# Patient Record
Sex: Female | Born: 1965 | Race: White | Hispanic: No
Health system: Southern US, Academic
[De-identification: ages and names within clinical notes are randomized; demographics above are authoritative.]

## PROBLEM LIST (undated history)

## (undated) ENCOUNTER — Ambulatory Visit: Payer: MEDICARE

## (undated) ENCOUNTER — Telehealth

## (undated) ENCOUNTER — Encounter
Attending: Student in an Organized Health Care Education/Training Program | Primary: Student in an Organized Health Care Education/Training Program

## (undated) ENCOUNTER — Encounter

## (undated) ENCOUNTER — Encounter
Attending: Pharmacist Clinician (PhC)/ Clinical Pharmacy Specialist | Primary: Pharmacist Clinician (PhC)/ Clinical Pharmacy Specialist

## (undated) ENCOUNTER — Encounter: Attending: Critical Care Medicine | Primary: Critical Care Medicine

## (undated) ENCOUNTER — Telehealth
Attending: Student in an Organized Health Care Education/Training Program | Primary: Student in an Organized Health Care Education/Training Program

## (undated) ENCOUNTER — Telehealth
Attending: Pharmacist Clinician (PhC)/ Clinical Pharmacy Specialist | Primary: Pharmacist Clinician (PhC)/ Clinical Pharmacy Specialist

## (undated) ENCOUNTER — Encounter
Payer: MEDICARE | Attending: Student in an Organized Health Care Education/Training Program | Primary: Student in an Organized Health Care Education/Training Program

## (undated) ENCOUNTER — Non-Acute Institutional Stay: Payer: MEDICARE

## (undated) DIAGNOSIS — Z1509 Genetic susceptibility to other malignant neoplasm: Secondary | ICD-10-CM

## (undated) DIAGNOSIS — Z9221 Personal history of antineoplastic chemotherapy: Secondary | ICD-10-CM

## (undated) DIAGNOSIS — F419 Anxiety disorder, unspecified: Secondary | ICD-10-CM

## (undated) DIAGNOSIS — Z9289 Personal history of other medical treatment: Secondary | ICD-10-CM

## (undated) DIAGNOSIS — F319 Bipolar disorder, unspecified: Secondary | ICD-10-CM

## (undated) DIAGNOSIS — F32A Depression, unspecified: Secondary | ICD-10-CM

## (undated) DIAGNOSIS — IMO0002 Reserved for concepts with insufficient information to code with codable children: Secondary | ICD-10-CM

## (undated) DIAGNOSIS — S42302A Unspecified fracture of shaft of humerus, left arm, initial encounter for closed fracture: Secondary | ICD-10-CM

## (undated) DIAGNOSIS — I10 Essential (primary) hypertension: Secondary | ICD-10-CM

## (undated) DIAGNOSIS — E785 Hyperlipidemia, unspecified: Secondary | ICD-10-CM

## (undated) DIAGNOSIS — F329 Major depressive disorder, single episode, unspecified: Secondary | ICD-10-CM

## (undated) DIAGNOSIS — C50919 Malignant neoplasm of unspecified site of unspecified female breast: Secondary | ICD-10-CM

## (undated) DIAGNOSIS — T4145XA Adverse effect of unspecified anesthetic, initial encounter: Secondary | ICD-10-CM

## (undated) DIAGNOSIS — C719 Malignant neoplasm of brain, unspecified: Secondary | ICD-10-CM

## (undated) DIAGNOSIS — Z9882 Breast implant status: Secondary | ICD-10-CM

## (undated) DIAGNOSIS — M461 Sacroiliitis, not elsewhere classified: Secondary | ICD-10-CM

## (undated) DIAGNOSIS — J309 Allergic rhinitis, unspecified: Secondary | ICD-10-CM

## (undated) DIAGNOSIS — C55 Malignant neoplasm of uterus, part unspecified: Secondary | ICD-10-CM

## (undated) DIAGNOSIS — Z800 Family history of malignant neoplasm of digestive organs: Secondary | ICD-10-CM

## (undated) DIAGNOSIS — G589 Mononeuropathy, unspecified: Secondary | ICD-10-CM

## (undated) DIAGNOSIS — C801 Malignant (primary) neoplasm, unspecified: Secondary | ICD-10-CM

## (undated) DIAGNOSIS — T8859XA Other complications of anesthesia, initial encounter: Secondary | ICD-10-CM

## (undated) DIAGNOSIS — IMO0001 Reserved for inherently not codable concepts without codable children: Secondary | ICD-10-CM

## (undated) DIAGNOSIS — Z808 Family history of malignant neoplasm of other organs or systems: Secondary | ICD-10-CM

## (undated) DIAGNOSIS — F339 Major depressive disorder, recurrent, unspecified: Secondary | ICD-10-CM

## (undated) DIAGNOSIS — Z1507 Genetic susceptibility to malignant neoplasm of urinary tract: Secondary | ICD-10-CM

## (undated) HISTORY — DX: Breast implant status: Z98.82

## (undated) HISTORY — DX: Family history of malignant neoplasm of other organs or systems: Z80.8

## (undated) HISTORY — PX: OTHER SURGICAL HISTORY: SHX169

## (undated) HISTORY — DX: Family history of malignant neoplasm of digestive organs: Z80.0

## (undated) HISTORY — DX: Hyperlipidemia, unspecified: E78.5

## (undated) HISTORY — DX: Malignant neoplasm of uterus, part unspecified: C55

## (undated) HISTORY — DX: Genetic susceptibility to malignant neoplasm of urinary tract: Z15.07

## (undated) HISTORY — PX: KNEE SURGERY: SHX244

## (undated) HISTORY — DX: Essential (primary) hypertension: I10

## (undated) HISTORY — DX: Mononeuropathy, unspecified: G58.9

## (undated) HISTORY — DX: Malignant neoplasm of brain, unspecified: C71.9

## (undated) HISTORY — DX: Sacroiliitis, not elsewhere classified: M46.1

## (undated) HISTORY — DX: Genetic susceptibility to other malignant neoplasm: Z15.09

## (undated) HISTORY — DX: Bipolar disorder, unspecified: F31.9

## (undated) HISTORY — PX: TUBAL LIGATION: SHX77

## (undated) HISTORY — DX: Malignant neoplasm of unspecified site of unspecified female breast: C50.919

## (undated) HISTORY — PX: MULTIPLE TOOTH EXTRACTIONS: SHX2053

## (undated) HISTORY — DX: Major depressive disorder, recurrent, unspecified: F33.9

## (undated) HISTORY — PX: MASTECTOMY: SHX3

## (undated) HISTORY — DX: Allergic rhinitis, unspecified: J30.9

## (undated) SURGERY — RECONSTRUCTION, BREAST
Anesthesia: General | Laterality: Bilateral

---
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
## 2010-10-22 ENCOUNTER — Ambulatory Visit: Payer: Self-pay | Admitting: Genetic Counselor

## 2010-11-03 DIAGNOSIS — C801 Malignant (primary) neoplasm, unspecified: Secondary | ICD-10-CM

## 2010-11-03 HISTORY — DX: Malignant (primary) neoplasm, unspecified: C80.1

## 2010-11-03 HISTORY — PX: BREAST SURGERY: SHX581

## 2010-12-11 ENCOUNTER — Other Ambulatory Visit (HOSPITAL_COMMUNITY): Payer: Self-pay | Admitting: Surgery

## 2010-12-11 DIAGNOSIS — C50919 Malignant neoplasm of unspecified site of unspecified female breast: Secondary | ICD-10-CM

## 2010-12-12 ENCOUNTER — Other Ambulatory Visit (HOSPITAL_COMMUNITY): Payer: Self-pay | Admitting: Surgery

## 2010-12-12 ENCOUNTER — Encounter (HOSPITAL_COMMUNITY)
Admission: RE | Admit: 2010-12-12 | Discharge: 2010-12-12 | Disposition: A | Discharge status: Home / Self Care | Payer: Medicare Other | Source: Ambulatory Visit | Attending: Surgery | Admitting: Surgery

## 2010-12-12 DIAGNOSIS — C50919 Malignant neoplasm of unspecified site of unspecified female breast: Secondary | ICD-10-CM

## 2010-12-12 DIAGNOSIS — Z01818 Encounter for other preprocedural examination: Secondary | ICD-10-CM | POA: Insufficient documentation

## 2010-12-12 LAB — COMPREHENSIVE METABOLIC PANEL
BUN: 5 mg/dL — ABNORMAL LOW (ref 6–23)
CO2: 26 mEq/L (ref 19–32)
Chloride: 105 mEq/L (ref 96–112)
Creatinine, Ser: 0.79 mg/dL (ref 0.4–1.2)
GFR calc non Af Amer: >60 mL/min (ref >60)
Glucose, Bld: 105 mg/dL — ABNORMAL HIGH (ref 70–99)
Total Bilirubin: 0.2 mg/dL — ABNORMAL LOW (ref 0.3–1.2)

## 2010-12-12 LAB — SURGICAL PCR SCREEN
MRSA, PCR: NEGATIVE
Staphylococcus aureus: POSITIVE — AB

## 2010-12-12 LAB — DIFFERENTIAL
Basophils Absolute: 0.1 10*3/uL (ref 0.0–0.1)
Lymphocytes Relative: 33 % (ref 12–46)
Neutro Abs: 6.3 10*3/uL (ref 1.7–7.7)
Neutrophils Relative %: 58 % (ref 43–77)

## 2010-12-12 LAB — CBC
HCT: 30.5 % — ABNORMAL LOW (ref 36.0–46.0)
Hemoglobin: 9.7 g/dL — ABNORMAL LOW (ref 12.0–15.0)
WBC: 10.9 10*3/uL — ABNORMAL HIGH (ref 4.0–10.5)

## 2010-12-16 ENCOUNTER — Ambulatory Visit (HOSPITAL_COMMUNITY): Admission: RE | Admit: 2010-12-16 | Payer: Self-pay | Source: Home / Self Care | Admitting: Surgery

## 2010-12-25 ENCOUNTER — Inpatient Hospital Stay (HOSPITAL_COMMUNITY)
Admission: RE | Admit: 2010-12-25 | Discharge: 2010-12-27 | DRG: 581 | Disposition: A | Discharge status: Home / Self Care | Payer: Medicare Other | Source: Ambulatory Visit | Attending: Plastic Surgery | Admitting: Plastic Surgery

## 2010-12-25 ENCOUNTER — Ambulatory Visit (HOSPITAL_COMMUNITY): Payer: Medicare Other

## 2010-12-25 ENCOUNTER — Other Ambulatory Visit: Payer: Self-pay | Admitting: Surgery

## 2010-12-25 DIAGNOSIS — C50919 Malignant neoplasm of unspecified site of unspecified female breast: Secondary | ICD-10-CM

## 2010-12-25 DIAGNOSIS — F17200 Nicotine dependence, unspecified, uncomplicated: Secondary | ICD-10-CM | POA: Diagnosis present

## 2010-12-25 DIAGNOSIS — Z79899 Other long term (current) drug therapy: Secondary | ICD-10-CM

## 2010-12-25 DIAGNOSIS — D0590 Unspecified type of carcinoma in situ of unspecified breast: Principal | ICD-10-CM | POA: Diagnosis present

## 2010-12-25 DIAGNOSIS — F319 Bipolar disorder, unspecified: Secondary | ICD-10-CM | POA: Diagnosis present

## 2010-12-25 LAB — CBC
Hemoglobin: 8.5 g/dL — ABNORMAL LOW (ref 12.0–15.0)
MCHC: 31.3 g/dL (ref 30.0–36.0)
RBC: 3.57 MIL/uL — ABNORMAL LOW (ref 3.87–5.11)
WBC: 18 10*3/uL — ABNORMAL HIGH (ref 4.0–10.5)

## 2010-12-25 LAB — BASIC METABOLIC PANEL
CO2: 22 mEq/L (ref 19–32)
Calcium: 8.5 mg/dL (ref 8.4–10.5)
Chloride: 106 mEq/L (ref 96–112)
GFR calc Af Amer: >60 mL/min (ref >60)
Sodium: 136 mEq/L (ref 135–145)

## 2010-12-25 MED ORDER — TECHNETIUM TC 99M SULFUR COLLOID FILTERED
1.0000 | Freq: Once | INTRAVENOUS | Status: AC | PRN
  Administered 2010-12-25: 1 via INTRADERMAL

## 2010-12-26 LAB — CBC
Hemoglobin: 7.4 g/dL — ABNORMAL LOW (ref 12.0–15.0)
MCH: 24.3 pg — ABNORMAL LOW (ref 26.0–34.0)
RBC: 3.05 MIL/uL — ABNORMAL LOW (ref 3.87–5.11)

## 2010-12-26 LAB — BASIC METABOLIC PANEL
CO2: 24 mEq/L (ref 19–32)
Chloride: 107 mEq/L (ref 96–112)
Creatinine, Ser: 0.81 mg/dL (ref 0.4–1.2)
GFR calc Af Amer: >60 mL/min (ref >60)

## 2010-12-31 NOTE — Op Note (Signed)
Laurie Benson, Laurie Benson                ACCOUNT NO.:  [id number]  MEDICAL RECORD NO.:  [id number]           PATIENT TYPE:  I  LOCATION:  5154                         FACILITY:  [facility]  PHYSICIAN:  Clovis Pu. 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  PROCEDURE: 1. Bilateral simple mastectomy. 2. Right axillary sentinel lymph node mapping with injection of     methylene blue dye.  SURGEON:  Maisie Fus A. Britney Captain, MD  ASSISTANT:  Wilmon Arms. Tsuei, MD  ANESTHESIA:  General endotracheal anesthesia.  ESTIMATED BLOOD LOSS:  50 mL.  SPECIMEN:  Bilateral breast and three right axillary lymph nodes, two were sentinel nodes, one was a nonsentinel node, to Pathology.  INDICATIONS FOR PROCEDURE:  The patient is a 45 year old female with a large area of right breast DCIS.  After discussion of options, she wished to undergo bilateral simple mastectomies with immediate reconstruction.  Dr. Alan Ripper Sanger was the plastic surgeon who agreed to assist with this.  Informed consent was obtained.  We discussed the procedure.  Risk of bleeding, infection, nerve injury, weakness, shoulder, numbness to the axillary area overlying both skin of the breast areas.  She understood the above, also potential risk of injury to other structures, nerves, arteries, veins, and the need for potentially more surgery.  She understood the above and agreed to proceed.  DESCRIPTION OF PROCEDURE:  The patient was brought to the operating room.  In the holding area, her right breast was injected with technetium sulfur colloid.  She was taken back to the operating room and general anesthesia was initiated.  Approximately 7 mL of methylene blue dye admixed with saline  were injected into the right periareolar region. Both breasts were prepped and draped in sterile fashion.  Left breast was done first.  Curvilinear incisions were made above and below the nipple-areolar complex.  Superior and inferior skin flaps were raised to the clavicle into the inframammary fold respectively.  Skin flap was also raised to the sternum medially.  Entire breast was then excised from the chest wall in a medial to lateral fashion, sent to Pathology. Hemostasis was achieved.  Right breast was then done.  Curvilinear incisions were made above and below the nipple-areolar complex.  Superior and inferior skin flaps were made as well as a medial skin flap.  Once these were all created, we then identified the sentinel node in the right axilla using the NeoProbe.  There were three nodes taken out, two were blue and hot, one was just a nonsentinel node which was sent as well.  After the nodes were sent for touch prep and they were negative, we went ahead and excised the breast off the chest wall in a medial to lateral fashion, oriented and passed off the field. Hemostasis was achieved.  Dr. Kelly Splinter then removed the procedure.  Please see her dictated note for completion of the procedure.  All final counts were found to be correct.  The patient was stable.     Tellis Spivak A. Tali Coster, M.D.     TAC/MEDQ  D:  12/25/2010  T:  12/26/2010  Job:  [id number]  Electronically Signed by Harriette Bouillon M.D. on 12/31/2010 08:46:44 AM

## 2011-01-03 NOTE — Discharge Summary (Signed)
  NAMEVEAR, STATON                ACCOUNT NO.:  [id number]  MEDICAL RECORD NO.:  [id number]           PATIENT TYPE:  I  LOCATION:  5154                         FACILITY:  [facility]  PHYSICIAN:  Wayland Denis, DO      DATE OF BIRTH:  1966/03/22  DATE OF ADMISSION:  12/25/2010 DATE OF DISCHARGE:  12/27/2010                              DISCHARGE SUMMARY   ADMITTING DIAGNOSIS:  Breast cancer.  DISCHARGE DIAGNOSIS:  Breast cancer.  PROCEDURE:  Bilateral mastectomy with immediate reconstruction using AlloDerm and expander.  BRIEF HISTORY:  Laurie Benson is a 45 year old Caucasian female who was diagnosed with breast cancer and after consultation decided on immediate reconstruction.  DESCRIPTION OF HOSPITAL COURSE:  The patient was taken to the operating room with Dr. Luisa Hart and underwent bilateral mastectomies.  She then was immediately reconstructed with expander and AlloDerm placement by Dr. Wayland Denis.  She did well during the surgery and remained stable. Postoperatively, she was managed on the surgery floor.  She initially had PCA for pain management, which was discontinued on postop day #1 and she was given oral pain medicine and breakthrough IVs.  She continued to advance her diet.  She was ambulating without any difficulty.  She was tolerating food well.  Her pain was well controlled.  On the day of discharge, her blood pressure was slightly low, but completely asymptomatic.  She was able to eat and ambulate and bring the blood pressure up.  She stated that she often has low blood pressure.  She was discharged to home with her husband with the following medications: 1. Keflex 500 mg one p.o. q.6 h. for 2 weeks. 2. Colace 100 mg one p.o. q.12 h., 10 pills, no refills. 3. Zofran 4 mg one p.o. q.6 h. p.r.n. nausea, vomiting. 4. Vicodin 5/325 one p.o. q.6 h. p.r.n. pain. 5. Valium 2 mg one p.o. q.12 h. muscle tightness.  The patient will follow up with Dr. Luisa Hart in 2 weeks  and Dr. Kelly Splinter in 1 week.     Wayland Denis, DO     CS/MEDQ  D:  12/27/2010  T:  12/27/2010  Job:  [id number]  Electronically Signed by Wayland Denis  on 01/03/2011 03:36:11 PM

## 2011-01-03 NOTE — Op Note (Signed)
  NAMECRISTELLA, Laurie Benson                ACCOUNT NO.:  [id number]  MEDICAL RECORD NO.:  [id number]           PATIENT TYPE:  I  LOCATION:  5154                         FACILITY:  [facility]  PHYSICIAN:  Wayland Denis, DO      DATE OF BIRTH:  1966-05-14  DATE OF PROCEDURE:  12/25/2010 DATE OF DISCHARGE:                              OPERATIVE REPORT   PREOPERATIVE DIAGNOSIS:  Right breast cancer.  POSTOPERATIVE DIAGNOSIS:  Right breast cancer.  PROCEDURE:  Bilateral immediate reconstruction of breasts with expander and AlloDerm 8 x 16 placement.  ATTENDING:  Wayland Denis, DO  ASSISTANT:  Wilmon Arms. Corliss Skains, MD  ANESTHESIA:  General.  INDICATIONS FOR PROCEDURE:  The patient is a 46 year old female who was diagnosed with right-sided breast cancer.  She decided on bilateral mastectomies with right sentinel lymph node biopsy, immediate reconstruction.  DESCRIPTION OF PROCEDURE:  The patient was taken to the operating room by General Surgery and they performed their portion of the case.  Once they were done with the left breast, the operating room was shared by general and plastic surgery.  Time-out was confirmed.  All information confirmed to be correct.  The left pocket was irrigated with normal saline.  Hemostasis was achieved using electrocautery.  The Bovie was then used to dissect the pectoralis major muscle off the chest wall and created a subpectoral pocket.  The AlloDerm was prepared according to the manufacture's guideline and 8 x 16 piece of AlloDerm was used.  Once it was ready, the AlloDerm was tacked into place along the inferior border of the pectoralis major muscle and the inframammary fold with 2-0 PDS simple interrupted and a running suture; 600 mL Allergan expander was prepared according to the manufacture's guidelines.  All air was evacuated.  The expander was placed underneath the AlloDerm and pectoralis major muscle.  The lateral portion of the AlloDerm was tacked to  the chest wall with 2-0 PDS.  A 15-blade was used to make a stab incision along the inframammary fold bilaterally and a 19-Blake drain was placed.  Drain was secured with a 3-0 silk suture.  The deep layers were closed with 3-0 Vicryl, 4-0 Vicryl and then a running subcuticular 5-0 Monocryl was used to close the skin.  Dermabond was applied; 200 mL of normal injectable saline was injected into the left expander.  The same procedure was done on the right.  The 8 x 16 piece of AlloDerm was used, tacked in the same fashion; 600 mL Allergan expander was placed but only 150 mL of injectable saline was put in the expander.  The incision was closed in the  same fashion as the other side.  ABDs were applied and a breast binder.  The patient tolerated the procedure well.  There were no complications.  She was awoken and taken to recovery room in stable condition.     Wayland Denis, DO     CS/MEDQ  D:  12/25/2010  T:  12/26/2010  Job:  [id number]  Electronically Signed by Wayland Denis  on 01/03/2011 09:25:22 AM

## 2011-11-14 ENCOUNTER — Other Ambulatory Visit: Payer: Self-pay | Admitting: Plastic Surgery

## 2011-11-14 DIAGNOSIS — C50919 Malignant neoplasm of unspecified site of unspecified female breast: Secondary | ICD-10-CM

## 2011-11-17 ENCOUNTER — Encounter (HOSPITAL_BASED_OUTPATIENT_CLINIC_OR_DEPARTMENT_OTHER): Payer: Self-pay | Admitting: *Deleted

## 2011-11-17 NOTE — Progress Notes (Signed)
Arrive with daughter  No labs needed

## 2011-11-18 ENCOUNTER — Other Ambulatory Visit: Payer: Self-pay | Admitting: Plastic Surgery

## 2011-11-18 ENCOUNTER — Encounter: Payer: Self-pay | Admitting: Plastic Surgery

## 2011-11-18 DIAGNOSIS — C50919 Malignant neoplasm of unspecified site of unspecified female breast: Secondary | ICD-10-CM | POA: Insufficient documentation

## 2011-11-18 HISTORY — DX: Malignant neoplasm of unspecified site of unspecified female breast: C50.919

## 2011-11-18 MED ORDER — MORPHINE SULFATE 2 MG/ML IJ SOLN: 0.5000 mg | INTRAMUSCULAR | Status: DC | PRN

## 2011-11-18 MED ORDER — DEXTROSE 5 % IV SOLN: 1.0000 g | Freq: Three times a day (TID) | INTRAVENOUS | Status: DC

## 2011-11-18 MED ORDER — ACETAMINOPHEN 325 MG PO TABS: 650.0000 mg | ORAL_TABLET | Freq: Four times a day (QID) | ORAL | Status: DC | PRN

## 2011-11-18 MED ORDER — ONDANSETRON HCL 4 MG/2ML IJ SOLN: 4.0000 mg | Freq: Four times a day (QID) | INTRAMUSCULAR | Status: DC | PRN

## 2011-11-18 MED ORDER — ACETAMINOPHEN 650 MG RE SUPP: 650.0000 mg | Freq: Four times a day (QID) | RECTAL | Status: DC | PRN

## 2011-11-18 MED ORDER — HYDROCODONE-ACETAMINOPHEN 5-325 MG PO TABS: 1.0000 | ORAL_TABLET | ORAL | Status: DC | PRN

## 2011-11-18 NOTE — H&P (Signed)
History and Physical  Laurie Benson   11/18/2011 9:15 AM Office Visit  MRN: [id number]  Department:  Plastic Surgery  Dept Phone: [phone]  Description: 46 year old female  Provider: Wayland Denis, DO    Diagnoses  -   CA - breast cancer   - Primary   174.9      Reason for Visit  -  Breast Reconstruction    H&P; Sx 11/20/11      Subjective:    Patient ID: Laurie Benson is a 46 y.o. female.  HPI Mrs. Stegman is a 46 yrs old wf here with her daughter  for a pre-operative history and physical for bilateral breast reconstruction with expander removal, capsulectomy and implant placement.  She had bilateral mastectomies with reconstruction with expander/alloderm (12/25/10) for right breast cancer. She was diagnosed with right breast DCIS intermediate to high grade after several months of nipple discharge. She had a mammagram, U/S and Biopsy. She underwent bilateral mastectomies. She was a 1 ppd smoker.  She was a 36D bra size. She has not had any recent illnesses.   The following portions of the patient's history were reviewed and updated as appropriate: allergies, current medications, past family history, past medical history, past social history, past surgical history and problem list.  Review of Systems  Constitutional: Negative.   HENT: Negative.   Eyes: Negative.   Respiratory: Negative.   Cardiovascular: Negative.   Gastrointestinal: Negative.   Genitourinary: Negative.   Musculoskeletal: Negative.   Neurological: Negative.   Hematological: Negative.   Psychiatric/Behavioral: Negative.    Objective:    Physical Exam  Constitutional: She appears well-developed and well-nourished.  HENT:   Head: Normocephalic and atraumatic.  Eyes: Conjunctivae and EOM are normal. Pupils are equal, round, and reactive to light.  Neck: Normal range of motion.  Cardiovascular: Normal rate.   Pulmonary/Chest: Effort normal. No respiratory distress. She has no wheezes.    Abdominal: Soft. She exhibits no distension. There is no tenderness.  Musculoskeletal: Normal range of motion.  Neurological: She is alert.  Skin: Skin is warm.  Psychiatric: She has a normal mood and affect. Her behavior is normal.     Assessment:  1.  CA - breast cancer       Plan:  She is currently 450/600 on the left and 470/350 on the right.  She understands she will be around 400-450 cc bilateral.  Consent was obtained with risks and benefits explained.  Referring Provider  -  Tressie Ellis, MD

## 2011-11-19 ENCOUNTER — Encounter (HOSPITAL_BASED_OUTPATIENT_CLINIC_OR_DEPARTMENT_OTHER): Payer: Self-pay | Admitting: *Deleted

## 2011-11-20 ENCOUNTER — Ambulatory Visit (HOSPITAL_BASED_OUTPATIENT_CLINIC_OR_DEPARTMENT_OTHER)
Admission: RE | Admit: 2011-11-20 | Discharge: 2011-11-20 | Disposition: A | Discharge status: Home / Self Care | Payer: Medicare Other | Source: Ambulatory Visit | Attending: Plastic Surgery | Admitting: Plastic Surgery

## 2011-11-20 ENCOUNTER — Other Ambulatory Visit: Payer: Self-pay | Admitting: Plastic Surgery

## 2011-11-20 ENCOUNTER — Encounter (HOSPITAL_BASED_OUTPATIENT_CLINIC_OR_DEPARTMENT_OTHER)
Admission: RE | Disposition: A | Discharge status: Home / Self Care | Payer: Self-pay | Source: Ambulatory Visit | Attending: Plastic Surgery

## 2011-11-20 ENCOUNTER — Encounter (HOSPITAL_BASED_OUTPATIENT_CLINIC_OR_DEPARTMENT_OTHER): Payer: Self-pay | Admitting: Anesthesiology

## 2011-11-20 ENCOUNTER — Encounter (HOSPITAL_BASED_OUTPATIENT_CLINIC_OR_DEPARTMENT_OTHER): Payer: Self-pay | Admitting: Plastic Surgery

## 2011-11-20 ENCOUNTER — Encounter (HOSPITAL_BASED_OUTPATIENT_CLINIC_OR_DEPARTMENT_OTHER): Payer: Self-pay | Admitting: *Deleted

## 2011-11-20 ENCOUNTER — Ambulatory Visit (HOSPITAL_BASED_OUTPATIENT_CLINIC_OR_DEPARTMENT_OTHER): Payer: Medicare Other | Admitting: Anesthesiology

## 2011-11-20 DIAGNOSIS — C50919 Malignant neoplasm of unspecified site of unspecified female breast: Secondary | ICD-10-CM | POA: Insufficient documentation

## 2011-11-20 DIAGNOSIS — Z01811 Encounter for preprocedural respiratory examination: Secondary | ICD-10-CM | POA: Insufficient documentation

## 2011-11-20 DIAGNOSIS — Z86000 Personal history of in-situ neoplasm of breast: Secondary | ICD-10-CM

## 2011-11-20 DIAGNOSIS — Z4430 Encounter for fitting and adjustment of external breast prosthesis, unspecified breast: Secondary | ICD-10-CM | POA: Insufficient documentation

## 2011-11-20 HISTORY — PX: BREAST RECONSTRUCTION: SHX9

## 2011-11-20 HISTORY — DX: Other complications of anesthesia, initial encounter: T88.59XA

## 2011-11-20 HISTORY — DX: Malignant (primary) neoplasm, unspecified: C80.1

## 2011-11-20 HISTORY — DX: Major depressive disorder, single episode, unspecified: F32.9

## 2011-11-20 HISTORY — DX: Depression, unspecified: F32.A

## 2011-11-20 HISTORY — DX: Anxiety disorder, unspecified: F41.9

## 2011-11-20 HISTORY — DX: Personal history of in-situ neoplasm of breast: Z86.000

## 2011-11-20 HISTORY — DX: Adverse effect of unspecified anesthetic, initial encounter: T41.45XA

## 2011-11-20 SURGERY — RECONSTRUCTION, BREAST
Anesthesia: General | Site: Breast | Laterality: Bilateral | Wound class: Clean

## 2011-11-20 MED ORDER — SODIUM CHLORIDE 0.9 % IR SOLN
Status: DC | PRN
  Administered 2011-11-20: 10:00:00

## 2011-11-20 MED ORDER — ONDANSETRON HCL 4 MG/2ML IJ SOLN
INTRAMUSCULAR | Status: DC | PRN
  Administered 2011-11-20: 4 mg via INTRAVENOUS

## 2011-11-20 MED ORDER — HYDROCODONE-ACETAMINOPHEN 5-500 MG PO TABS: ORAL_TABLET | ORAL | Status: DC

## 2011-11-20 MED ORDER — MORPHINE SULFATE 2 MG/ML IJ SOLN: 0.0500 mg/kg | INTRAMUSCULAR | Status: DC | PRN

## 2011-11-20 MED ORDER — DROPERIDOL 2.5 MG/ML IJ SOLN
INTRAMUSCULAR | Status: DC | PRN
  Administered 2011-11-20: 0.625 mg via INTRAVENOUS

## 2011-11-20 MED ORDER — LACTATED RINGERS IV SOLN
INTRAVENOUS | Status: DC
  Administered 2011-11-20 (×2): via INTRAVENOUS

## 2011-11-20 MED ORDER — MIDAZOLAM HCL 5 MG/5ML IJ SOLN
INTRAMUSCULAR | Status: DC | PRN
  Administered 2011-11-20: 2 mg via INTRAVENOUS

## 2011-11-20 MED ORDER — METOCLOPRAMIDE HCL 5 MG/ML IJ SOLN: 10.0000 mg | Freq: Once | INTRAMUSCULAR | Status: DC | PRN

## 2011-11-20 MED ORDER — FENTANYL CITRATE 0.05 MG/ML IJ SOLN
INTRAMUSCULAR | Status: DC | PRN
  Administered 2011-11-20: 100 ug via INTRAVENOUS
  Administered 2011-11-20 (×2): 50 ug via INTRAVENOUS

## 2011-11-20 MED ORDER — CEFAZOLIN SODIUM 1-5 GM-% IV SOLN
INTRAVENOUS | Status: DC | PRN
  Administered 2011-11-20: 1 g via INTRAVENOUS

## 2011-11-20 MED ORDER — LIDOCAINE HCL (CARDIAC) 20 MG/ML IV SOLN
INTRAVENOUS | Status: DC | PRN
  Administered 2011-11-20: 50 mg via INTRAVENOUS

## 2011-11-20 MED ORDER — ACETAMINOPHEN 10 MG/ML IV SOLN
1000.0000 mg | Freq: Once | INTRAVENOUS | Status: AC
  Administered 2011-11-20: 1000 mg via INTRAVENOUS

## 2011-11-20 MED ORDER — PROPOFOL 10 MG/ML IV EMUL
INTRAVENOUS | Status: DC | PRN
  Administered 2011-11-20: 200 mg via INTRAVENOUS

## 2011-11-20 MED ORDER — DEXAMETHASONE SODIUM PHOSPHATE 4 MG/ML IJ SOLN
INTRAMUSCULAR | Status: DC | PRN
  Administered 2011-11-20: 10 mg via INTRAVENOUS

## 2011-11-20 MED ORDER — DIAZEPAM 2 MG PO TABS: 2.0000 mg | ORAL_TABLET | Freq: Two times a day (BID) | ORAL | Status: AC | PRN

## 2011-11-20 MED ORDER — CEPHALEXIN 500 MG PO CAPS: 500.0000 mg | ORAL_CAPSULE | Freq: Four times a day (QID) | ORAL | Status: AC

## 2011-11-20 MED ORDER — MIDAZOLAM HCL 2 MG/2ML IJ SOLN: 0.5000 mg | INTRAMUSCULAR | Status: DC | PRN

## 2011-11-20 MED ORDER — HYDROMORPHONE HCL PF 1 MG/ML IJ SOLN
0.2500 mg | INTRAMUSCULAR | Status: DC | PRN
  Administered 2011-11-20 (×3): 0.5 mg via INTRAVENOUS

## 2011-11-20 SURGICAL SUPPLY — 64 items
ADH SKN CLS APL DERMABOND .7 (GAUZE/BANDAGES/DRESSINGS) ×2
BAG DECANTER FOR FLEXI CONT (MISCELLANEOUS) ×3 IMPLANT
BANDAGE GAUZE ELAST BULKY 4 IN (GAUZE/BANDAGES/DRESSINGS) ×4 IMPLANT
BINDER BREAST LRG (GAUZE/BANDAGES/DRESSINGS) ×1 IMPLANT
BINDER BREAST MEDIUM (GAUZE/BANDAGES/DRESSINGS) IMPLANT
BINDER BREAST XLRG (GAUZE/BANDAGES/DRESSINGS) IMPLANT
BINDER BREAST XXLRG (GAUZE/BANDAGES/DRESSINGS) IMPLANT
BLADE HEX COATED 2.75 (ELECTRODE) ×2 IMPLANT
BLADE SURG 15 STRL LF DISP TIS (BLADE) ×1 IMPLANT
BLADE SURG 15 STRL SS (BLADE) ×4
CANISTER SUCTION 1200CC (MISCELLANEOUS) ×3 IMPLANT
CHLORAPREP W/TINT 26ML (MISCELLANEOUS) ×2 IMPLANT
CLOTH BEACON ORANGE TIMEOUT ST (SAFETY) ×2 IMPLANT
COVER MAYO STAND STRL (DRAPES) ×2 IMPLANT
COVER TABLE BACK 60X90 (DRAPES) ×2 IMPLANT
DECANTER SPIKE VIAL GLASS SM (MISCELLANEOUS) IMPLANT
DERMABOND ADVANCED (GAUZE/BANDAGES/DRESSINGS) ×2
DERMABOND ADVANCED .7 DNX12 (GAUZE/BANDAGES/DRESSINGS) ×2 IMPLANT
DRAIN CHANNEL 19F RND (DRAIN) IMPLANT
DRAPE LAPAROSCOPIC ABDOMINAL (DRAPES) ×2 IMPLANT
DRSG PAD ABDOMINAL 8X10 ST (GAUZE/BANDAGES/DRESSINGS) ×3 IMPLANT
ELECT BLADE 4.0 EZ CLEAN MEGAD (MISCELLANEOUS) ×2
ELECT BLADE 6.5 .24CM SHAFT (ELECTRODE) IMPLANT
ELECT REM PT RETURN 9FT ADLT (ELECTROSURGICAL) ×2
ELECTRODE BLDE 4.0 EZ CLN MEGD (MISCELLANEOUS) ×1 IMPLANT
ELECTRODE REM PT RTRN 9FT ADLT (ELECTROSURGICAL) ×1 IMPLANT
EVACUATOR SILICONE 100CC (DRAIN) IMPLANT
GAUZE SPONGE 4X4 12PLY STRL LF (GAUZE/BANDAGES/DRESSINGS) IMPLANT
GLOVE BIO SURGEON STRL SZ 6.5 (GLOVE) ×4 IMPLANT
GLOVE SKINSENSE NS SZ7.0 (GLOVE) ×1
GLOVE SKINSENSE STRL SZ7.0 (GLOVE) ×1 IMPLANT
GOWN PREVENTION PLUS XLARGE (GOWN DISPOSABLE) ×3 IMPLANT
IMPL BREAST P5.3XHI PRFL RND (Breast) ×2 IMPLANT
IMPL BRST P5.3XHI PRFL RND (Breast) ×2 IMPLANT
IMPLANT SILICONE 475CC (Breast) ×4 IMPLANT
IV NS 1000ML (IV SOLUTION)
IV NS 1000ML BAXH (IV SOLUTION) IMPLANT
IV NS 500ML (IV SOLUTION)
IV NS 500ML BAXH (IV SOLUTION) ×1 IMPLANT
KIT FILL SYSTEM UNIVERSAL (SET/KITS/TRAYS/PACK) IMPLANT
NDL HYPO 25X1 1.5 SAFETY (NEEDLE) IMPLANT
NDL SPNL 22GX3.5 QUINCKE BK (NEEDLE) IMPLANT
NEEDLE HYPO 25X1 1.5 SAFETY (NEEDLE) IMPLANT
NEEDLE SPNL 22GX3.5 QUINCKE BK (NEEDLE) IMPLANT
NS IRRIG 1000ML POUR BTL (IV SOLUTION) ×1 IMPLANT
PACK BASIN DAY SURGERY FS (CUSTOM PROCEDURE TRAY) ×2 IMPLANT
PENCIL BUTTON HOLSTER BLD 10FT (ELECTRODE) ×2 IMPLANT
PIN SAFETY STERILE (MISCELLANEOUS) IMPLANT
SLEEVE SCD COMPRESS KNEE MED (MISCELLANEOUS) ×2 IMPLANT
SPONGE LAP 18X18 X RAY DECT (DISPOSABLE) ×4 IMPLANT
STRIP CLOSURE SKIN 1/2X4 (GAUZE/BANDAGES/DRESSINGS) IMPLANT
SUT MON AB 5-0 PS2 18 (SUTURE) ×4 IMPLANT
SUT PDS AB 2-0 CT2 27 (SUTURE) ×2 IMPLANT
SUT VIC AB 3-0 SH 27 (SUTURE) ×2
SUT VIC AB 3-0 SH 27X BRD (SUTURE) ×1 IMPLANT
SUT VICRYL 4-0 PS2 18IN ABS (SUTURE) ×8 IMPLANT
SYR 50ML LL SCALE MARK (SYRINGE) IMPLANT
SYR BULB IRRIGATION 50ML (SYRINGE) ×2 IMPLANT
SYR CONTROL 10ML LL (SYRINGE) IMPLANT
TOWEL OR 17X24 6PK STRL BLUE (TOWEL DISPOSABLE) ×4 IMPLANT
TUBE CONNECTING 20X1/4 (TUBING) ×2 IMPLANT
UNDERPAD 30X30 INCONTINENT (UNDERPADS AND DIAPERS) ×4 IMPLANT
WATER STERILE IRR 1000ML POUR (IV SOLUTION) ×1 IMPLANT
YANKAUER SUCT BULB TIP NO VENT (SUCTIONS) ×2 IMPLANT

## 2011-11-20 NOTE — Interval H&P Note (Signed)
History and Physical Interval Note:  11/20/2011 8:24 AM  Laurie Benson  has presented today for surgery, with the diagnosis of breast cancer  The various methods of treatment have been discussed with the patient and family. After consideration of risks, benefits and other options for treatment, the patient has consented to  Procedure(s): BREAST RECONSTRUCTION as a surgical intervention .  The patients' history has been reviewed, patient examined, no change in status, stable for surgery.  I have reviewed the patients' chart and labs.  Questions were answered to the patient's satisfaction.     SANGER,Jencarlo Bonadonna  The patient was seen in holding and marked.  Consent was confirmed and signed with risks and complications reviewed.  They include bleeding, pain, scar, risk of anesthesia, capsular contracture, infection, implant rupture and need for additional surgery. There is no change in the history or physical.

## 2011-11-20 NOTE — Anesthesia Preprocedure Evaluation (Signed)
Anesthesia Evaluation  Patient identified by MRN, date of birth, ID band Patient awake    Reviewed: Allergy & Precautions, H&P , NPO status , Patient's Chart, lab work & pertinent test results, reviewed documented beta blocker date and time   History of Anesthesia Complications (+) PONV  Airway Mallampati: II TM Distance: >3 FB Neck ROM: full    Dental   Pulmonary neg pulmonary ROS,          Cardiovascular neg cardio ROS     Neuro/Psych PSYCHIATRIC DISORDERS Negative Neurological ROS     GI/Hepatic negative GI ROS, Neg liver ROS,   Endo/Other  Negative Endocrine ROS  Renal/GU negative Renal ROS  Genitourinary negative   Musculoskeletal   Abdominal   Peds  Hematology negative hematology ROS (+)   Anesthesia Other Findings See surgeon's H&P   Reproductive/Obstetrics negative OB ROS                           Anesthesia Physical Anesthesia Plan  ASA: II  Anesthesia Plan: General   Post-op Pain Management:    Induction: Intravenous  Airway Management Planned: LMA  Additional Equipment:   Intra-op Plan:   Post-operative Plan: Extubation in OR  Informed Consent: I have reviewed the patients History and Physical, chart, labs and discussed the procedure including the risks, benefits and alternatives for the proposed anesthesia with the patient or authorized representative who has indicated his/her understanding and acceptance.     Plan Discussed with: CRNA and Surgeon  Anesthesia Plan Comments:         Anesthesia Quick Evaluation

## 2011-11-20 NOTE — Transfer of Care (Signed)
Immediate Anesthesia Transfer of Care Note  Patient: Laurie Benson  Procedure(s) Performed:  BREAST RECONSTRUCTION - bilateral implant exchange for breast reconstruction and capsulectomy  Patient Location: PACU  Anesthesia Type: General  Level of Consciousness: awake, alert  and oriented  Airway & Oxygen Therapy: Patient Spontanous Breathing and Patient connected to face mask oxygen  Post-op Assessment: Report given to PACU RN and Post -op Vital signs reviewed and stable  Post vital signs: Reviewed and stable Filed Vitals:   11/20/11 1106  BP:   Pulse: 104  Temp:   Resp:     Complications: No apparent anesthesia complications

## 2011-11-20 NOTE — Anesthesia Postprocedure Evaluation (Signed)
Anesthesia Post Note  Patient: Laurie Benson  Procedure(s) Performed:  BREAST RECONSTRUCTION - bilateral implant exchange for breast reconstruction and capsulectomy  Anesthesia type: General  Patient location: PACU  Post pain: Pain level controlled  Post assessment: Patient's Cardiovascular Status Stable  Last Vitals:  Filed Vitals:   11/20/11 1215  BP: 106/53  Pulse: 88  Temp:   Resp: 12    Post vital signs: Reviewed and stable  Level of consciousness: alert  Complications: No apparent anesthesia complications

## 2011-11-20 NOTE — H&P (View-Only) (Signed)
History and Physical  Laurie Benson   11/18/2011 9:15 AM Office Visit  MRN: [id number]  Department:  Plastic Surgery  Dept Phone: [phone]  Description: 46 year old female  Provider: Jadan Rouillard Sanger, DO    Diagnoses  -   CA - breast cancer   - Primary   174.9      Reason for Visit  -  Breast Reconstruction    H&P; Sx 11/20/11      Subjective:    Patient ID: Laurie Benson is a 46 y.o. female.  HPI Laurie Benson is a 46 yrs old wf here with her daughter  for a pre-operative history and physical for bilateral breast reconstruction with expander removal, capsulectomy and implant placement.  She had bilateral mastectomies with reconstruction with expander/alloderm (12/25/10) for right breast cancer. She was diagnosed with right breast DCIS intermediate to high grade after several months of nipple discharge. She had a mammagram, U/S and Biopsy. She underwent bilateral mastectomies. She was a 1 ppd smoker.  She was a 36D bra size. She has not had any recent illnesses.   The following portions of the patient's history were reviewed and updated as appropriate: allergies, current medications, past family history, past medical history, past social history, past surgical history and problem list.  Review of Systems  Constitutional: Negative.   HENT: Negative.   Eyes: Negative.   Respiratory: Negative.   Cardiovascular: Negative.   Gastrointestinal: Negative.   Genitourinary: Negative.   Musculoskeletal: Negative.   Neurological: Negative.   Hematological: Negative.   Psychiatric/Behavioral: Negative.    Objective:    Physical Exam  Constitutional: She appears well-developed and well-nourished.  HENT:   Head: Normocephalic and atraumatic.  Eyes: Conjunctivae and EOM are normal. Pupils are equal, round, and reactive to light.  Neck: Normal range of motion.  Cardiovascular: Normal rate.   Pulmonary/Chest: Effort normal. No respiratory distress. She has no wheezes.    Abdominal: Soft. She exhibits no distension. There is no tenderness.  Musculoskeletal: Normal range of motion.  Neurological: She is alert.  Skin: Skin is warm.  Psychiatric: She has a normal mood and affect. Her behavior is normal.     Assessment:  1.  CA - breast cancer       Plan:  She is currently 450/600 on the left and 470/350 on the right.  She understands she will be around 400-450 cc bilateral.  Consent was obtained with risks and benefits explained.  Referring Provider  -  Jeffrey Curtis Hooper, MD   

## 2011-11-20 NOTE — Brief Op Note (Signed)
11/20/2011  11:02 AM  PATIENT:  Laurie Benson  46 y.o. female  PRE-OPERATIVE DIAGNOSIS:  breast cancer  POST-OPERATIVE DIAGNOSIS:  breast cancer  PROCEDURE:  Procedure(s): BILATERAL BREAST RECONSTRUCTION WITH REMOVAL OF EXPANDERS, CAPSULECTOMIES AND PLACEMENT OF IMPLANTS  SURGEON:  Surgeon(s): [employer], DO  PHYSICIAN ASSISTANT:   ASSISTANTS: none   ANESTHESIA:   general  EBL:  Total I/O In: 1000 [I.V.:1000] Out: -   BLOOD ADMINISTERED:none  DRAINS: none   LOCAL MEDICATIONS USED:  NONE  SPECIMEN:  Source of Specimen:  bilateral mastectomy scar and lateral capsulectomies  DISPOSITION OF SPECIMEN:  PATHOLOGY  COUNTS:  YES  TOURNIQUET:  * No tourniquets in log *  DICTATION: dictated  PLAN OF CARE: Discharge to home after PACU  PATIENT DISPOSITION:  PACU - hemodynamically stable.   Delay start of Pharmacological VTE agent (>24hrs) due to surgical blood loss or risk of bleeding:  NO

## 2011-11-20 NOTE — Anesthesia Procedure Notes (Signed)
Procedure Name: LMA Insertion Date/Time: 11/20/2011 8:49 AM Performed by: Zenia Resides D Pre-anesthesia Checklist: Patient identified, Emergency Drugs available, Suction available, Patient being monitored and Timeout performed Patient Re-evaluated:Patient Re-evaluated prior to inductionOxygen Delivery Method: Circle System Utilized Preoxygenation: Pre-oxygenation with 100% oxygen Intubation Type: IV induction Ventilation: Mask ventilation without difficulty LMA: LMA with gastric port inserted LMA Size: 4.0 Number of attempts: 1 Placement Confirmation: positive ETCO2 and breath sounds checked- equal and bilateral Tube secured with: Tape Dental Injury: Teeth and Oropharynx as per pre-operative assessment

## 2011-11-21 NOTE — Op Note (Signed)
NAME:  Laurie Benson, Laurie Benson NO.:  MEDICAL RECORD NO.:  [id number]  LOCATION:                                 FACILITY:  PHYSICIAN:  Wayland Denis, DO      DATE OF BIRTH:  Jun 08, 1966  DATE OF PROCEDURE:  11/20/2011 DATE OF DISCHARGE:                              OPERATIVE REPORT   PREOPERATIVE DIAGNOSIS:  Breast cancer.  POSTOPERATIVE DIAGNOSIS:  Breast cancer.  PROCEDURE:  Bilateral breast reconstruction with removal of expanders, capsulectomies, and placement of implants.  ATTENDING SURGEON:  Wayland Denis, DO  ANESTHESIA:  General.  INDICATION FOR PROCEDURE:  The patient is a 46 year old white female who underwent mastectomies bilaterally for breast cancer and expander placement.  She was expanded in the office and presents for an expander exchange.  Risks and complications were reviewed including bleeding, pain, scar, risk of anesthesia, capsulectomies, implant rupture, infection. She was marked, consent was confirmed.  All questions were answered to her stated satisfaction.  DESCRIPTION OF PROCEDURE:  The patient was taken to the operating room, placed on the operating room table in the supine position.  General anesthesia was administered.  Once adequate, a time-out was called.  All information was confirmed to be correct.  She was prepped and draped in usual sterile fashion.  We started on the left side.  An ellipse was taken and the mastectomy scar was sent to Pathology.  The Bovie was used to dissect down to the expander which was removed and the pocket was irrigated with antibiotic solution and warm normal saline.  The lateral capsule was excised with the Bovie and brought more medial to decrease the width of the space for the implant.  It was tacked into place with 2- 0 PDS.  Hemostasis was achieved with electrocautery.  It was loosened in the superior direction as well.  The capsule was released.  The smooth round high-profile gel breast  implant, 475cc was chosen, reference [id number], lot [id number], SN [id number].  The same procedure was done on the opposite side and the same implant was selected, which was reference #[id number], lot #[id number], SN #[id number].  It was also a smooth, round high-profile Mentor gel implant, 475.  The muscle was reapproximated with 3-0 Vicryl figure-of-eight sutures followed by 4-0 Vicryl running suture and the skin was reapproximated with 5-0 Monocryl running subcuticular stitch.  Dermabond was applied with ABDs and a breast binder.  The patient tolerated the procedure well.  There were no complications.  She was awoken and taken to recovery room in stable condition.     Wayland Denis, DO     CS/MEDQ  D:  11/20/2011  T:  11/21/2011  Job:  [id number]

## 2011-11-24 ENCOUNTER — Encounter (HOSPITAL_BASED_OUTPATIENT_CLINIC_OR_DEPARTMENT_OTHER): Payer: Self-pay | Admitting: Plastic Surgery

## 2011-12-02 DIAGNOSIS — M79609 Pain in unspecified limb: Secondary | ICD-10-CM | POA: Insufficient documentation

## 2014-08-31 ENCOUNTER — Encounter: Payer: Self-pay | Admitting: Gynecologic Oncology

## 2014-08-31 ENCOUNTER — Ambulatory Visit: Payer: Medicare Other | Attending: Gynecologic Oncology | Admitting: Gynecologic Oncology

## 2014-08-31 DIAGNOSIS — Z9013 Acquired absence of bilateral breasts and nipples: Secondary | ICD-10-CM | POA: Insufficient documentation

## 2014-08-31 DIAGNOSIS — N950 Postmenopausal bleeding: Secondary | ICD-10-CM | POA: Insufficient documentation

## 2014-08-31 DIAGNOSIS — Z800 Family history of malignant neoplasm of digestive organs: Secondary | ICD-10-CM | POA: Diagnosis not present

## 2014-08-31 DIAGNOSIS — Z86010 Personal history of colonic polyps: Secondary | ICD-10-CM

## 2014-08-31 DIAGNOSIS — Z853 Personal history of malignant neoplasm of breast: Secondary | ICD-10-CM | POA: Diagnosis not present

## 2014-08-31 DIAGNOSIS — Z8542 Personal history of malignant neoplasm of other parts of uterus: Secondary | ICD-10-CM | POA: Diagnosis not present

## 2014-08-31 DIAGNOSIS — C541 Malignant neoplasm of endometrium: Secondary | ICD-10-CM

## 2014-08-31 DIAGNOSIS — Z808 Family history of malignant neoplasm of other organs or systems: Secondary | ICD-10-CM | POA: Insufficient documentation

## 2014-08-31 DIAGNOSIS — F17210 Nicotine dependence, cigarettes, uncomplicated: Secondary | ICD-10-CM | POA: Insufficient documentation

## 2014-08-31 DIAGNOSIS — Z79899 Other long term (current) drug therapy: Secondary | ICD-10-CM | POA: Insufficient documentation

## 2014-08-31 HISTORY — DX: Malignant neoplasm of endometrium: C54.1

## 2014-08-31 NOTE — Progress Notes (Signed)
Consult Note: Gyn-Onc  Consult was requested by Dr. Marvel Plan for the evaluation of Laurie Benson 48 y.o. 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 Bleeding/Bruising    postmenopausal  . endometrial cancer    Assessment/Plan:  Laurie Benson  is a 48 y.o.  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   A detailed discussion was held with the patient and her family with regard to to her endometrial cancer diagnosis. We discussed the standard management options for uterine cancer which includes surgery followed possibly by adjuvant therapy depending on the results of surgery. The options for surgical management include a hysterectomy and removal of the tubes and ovaries possibly with removal of pelvic and para-aortic lymph nodes. A minimally invasive approach including a robotic hysterectomy or laparoscopic hysterectomy have benefits including shorter hospital stay, recovery time and better wound healing. The alternative approach is an open hysterectomy. The patient has been counseled about these surgical options and the risks of surgery in general including infection, bleeding, damage to surrounding structures (including bowel, bladder, ureters, nerves or vessels), and the postoperative risks of PE/ DVT, and lymphedema. I extensively reviewed the additional risks of robotic hysterectomy including possible need for conversion to open laparotomy.  I discussed positioning during surgery of trendelenberg and risks of minor facial swelling and care we take in preoperative positioning.  After counseling and consideration of her options, she desires to proceed with robotic hysterectomy, pelvic and para-aortic lymphadenectomy.   I have ordered a preoperative CT of the abdomen and pelvis to evaluate for possible occult metastatic disease given the IHC profile of her biopsy  suggesting the possiblity of a serous lesion.   She has a family history very concerning for Lynch syndrome, and her own personal history (recurrent colonic polyps, bilateral breast cancer and endometrial cancer) also suggest this. I recommend her tumor be tested for MSI and she see genetics counselors postoperatively.  In order to expedite the timing of her surgery we have arranged for her to have surgery at [facility] with Dr Cindie Laroche who has availability within the next month.  She will be seen by anesthesia for preoperative clearance and discussion of postoperative pain management.  She was given the opportunity to ask questions, which were answered to her satisfaction, and she is agreement with the above mentioned plan of care.  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 The patient reports that in the past 12 months her previously regular menses have become heavy and irregular. This prompted Dr Marvel Plan to perform an ultrasound of the pelvis on 07/27/14 which revealed a uterus measuring 8x5x5cm with a 2cm intramural fibroid, and normal appearing ovaries. The endometrial thickness was 2cm.   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 She also reports a history of multiple colonic polyps and has had several resections of these. She is recommended to continue  annual colonoscopies. Her father was diagnosed with colon cancer in his 30's. His  brother (her uncle) had "brain cancer" from which he died in his 29's and her paternal grandmother had a history of "eye cancer". She reports no known specific genetic mutation that is known in her family.  Interval History: She continues to have irregular bleeding.  Current Meds:  Outpatient Encounter Prescriptions as of 08/31/2014  Medication Sig  . ARIPiprazole (ABILIFY) 10 MG tablet Take 10 mg by mouth daily.  Marland Kitchen aspirin-acetaminophen-caffeine (EXCEDRIN MIGRAINE) 250-250-65 MG per tablet Take 1 tablet by mouth every 6 (six) hours as needed.  . Choline Fenofibrate (FENOFIBRIC ACID) 135 MG CPDR   . clonazePAM (KLONOPIN) 2 MG tablet Take 2 mg by mouth 2 (two) times daily as needed.  . doxepin (SINEQUAN) 25 MG capsule   . escitalopram (LEXAPRO) 20 MG tablet Take 20 mg by mouth daily.  Marland Kitchen FLUoxetine (PROZAC) 40 MG capsule   . HYDROcodone-acetaminophen (VICODIN) 5-500 MG per tablet 5/33m  . pravastatin (PRAVACHOL) 40 MG tablet   . traZODone (DESYREL) 100 MG tablet   . [DISCONTINUED] ferrous fumarate (HEMOCYTE - 106 MG FE) 325 (106 FE) MG TABS Take 1 tablet by mouth.  . [DISCONTINUED] QUEtiapine (SEROQUEL) 50 MG tablet Take 50 mg by mouth at bedtime.    Allergy:  Allergies  Allergen Reactions  . Tetracyclines & Related Swelling    Social Hx:   History   Social History  . Marital Status: Legally Separated    Spouse Name: N/A    Number of Children: N/A  . Years of Education: N/A   Occupational History  . Not on file.   Social History Main Topics  . Smoking status: Heavy Tobacco Smoker -- 1.00 packs/day    Types: Cigarettes  . Smokeless tobacco: Never Used  . Alcohol Use: Yes  . Drug Use: No  . Sexual Activity: Not Currently   Other Topics Concern  . Not on file   Social History Narrative  . No narrative on file    Past Surgical Hx:  Past Surgical History  Procedure Laterality Date  . Breast surgery  2012    bilat mastectomy-rt snbx  . Tubal ligation    .  Cesarean section      x2  . Multiple tooth extractions    . Breast reconstruction  11/20/2011    Procedure: BREAST RECONSTRUCTION;  Surgeon: CTheodoro Kos DO;  [place]  Service: Plastics;  Laterality: Bilateral;  bilateral implant exchange for breast reconstruction and capsulectomy    Past Medical Hx:  Past Medical History  Diagnosis Date  . Anxiety   . Depression   . Complication of anesthesia     heard talking during teeth extraction  . Breast cancer   . Cancer 2012    bilat br ca  . Uterine cancer     Past Gynecological History:  G6P6. 4xSVD, 2 x c/s.  No LMP recorded.  Family Hx:  Family History  Problem Relation Age of Onset  . Cancer Father 365   colon  . Cancer Paternal Uncle 331   brain  . Cancer Paternal Grandmother     cancer of her "eyes"    Review of Systems:  Constitutional  Feels well,    ENT Normal appearing ears and nares bilaterally Skin/Breast  No rash, sores, jaundice, itching, dryness Cardiovascular  No chest pain, shortness of breath, or edema  Pulmonary  No cough or wheeze.  Gastro Intestinal  No nausea, vomitting, or diarrhoea. No bright red blood per rectum, no abdominal pain, change in bowel movement, or constipation.  Genito Urinary  No frequency, urgency, dysuria, see HPI Musculo Skeletal  No myalgia, arthralgia, joint swelling or pain  Neurologic  No weakness, numbness, change in gait,  Psychology  Flat affect  Vitals:  There were no vitals taken for this visit.  Physical Exam: WD in NAD Neck  Supple NROM, without any enlargements.  Lymph Node Survey No cervical supraclavicular or inguinal adenopathy Cardiovascular  Pulse normal rate, regularity and rhythm. S1 and S2 normal.  Lungs  Clear to auscultation bilateraly, without wheezes/crackles/rhonchi. Good air movement.  Skin  No rash/lesions/breakdown  Psychiatry  Alert and oriented to person, place, and time, flattened affect. Abdomen   Normoactive bowel sounds, abdomen soft, non-tender and thin without evidence of hernia.  Back No CVA tenderness Genito Urinary  Vulva/vagina: Normal external female genitalia.  No lesions. No discharge or bleeding.  Bladder/urethra:  No lesions or masses, well supported bladder  Vagina: no lesions  Cervix: Normal appearing, no lesions.  Uterus:  Small, mobile, no parametrial involvement or nodularity.  Adnexa: no palpable masses. Rectal  Good tone, no masses no cul de sac nodularity.  Extremities  No bilateral cyanosis, clubbing or edema.   Donaciano Eva, MD   08/31/2014, 3:56 PM

## 2014-08-31 NOTE — Patient Instructions (Signed)
Hysterectomy Information  A hysterectomy is a surgery in which your uterus is removed. This surgery may be done to treat various medical problems. After the surgery, you will no longer have menstrual periods. The surgery will also make you unable to become pregnant (sterile). The fallopian tubes and ovaries can be removed (bilateral salpingo-oophorectomy) during this surgery as well.  REASONS FOR A HYSTERECTOMY  Persistent, abnormal bleeding.  Lasting (chronic) pelvic pain or infection.  The lining of the uterus (endometrium) starts growing outside the uterus (endometriosis).  The endometrium starts growing in the muscle of the uterus (adenomyosis).  The uterus falls down into the vagina (pelvic organ prolapse).  Noncancerous growths in the uterus (uterine fibroids) that cause symptoms.  Precancerous cells.  Cervical cancer or uterine cancer. TYPES OF HYSTERECTOMIES  Supracervical hysterectomy--In this type, the top part of the uterus is removed, but not the cervix.  Total hysterectomy--The uterus and cervix are removed.  Radical hysterectomy--The uterus, the cervix, and the fibrous tissue that holds the uterus in place in the pelvis (parametrium) are removed. WAYS A HYSTERECTOMY CAN BE PERFORMED  Abdominal hysterectomy--A large surgical cut (incision) is made in the abdomen. The uterus is removed through this incision.  Vaginal hysterectomy--An incision is made in the vagina. The uterus is removed through this incision. There are no abdominal incisions.  Conventional laparoscopic hysterectomy--Three or four small incisions are made in the abdomen. A thin, lighted tube with a camera (laparoscope) is inserted into one of the incisions. Other tools are put through the other incisions. The uterus is cut into small pieces. The small pieces are removed through the incisions, or they are removed through the vagina.  Laparoscopically assisted vaginal hysterectomy (LAVH)--Three or four  small incisions are made in the abdomen. Part of the surgery is performed laparoscopically and part vaginally. The uterus is removed through the vagina.  Robot-assisted laparoscopic hysterectomy--A laparoscope and other tools are inserted into 3 or 4 small incisions in the abdomen. A computer-controlled device is used to give the surgeon a 3D image and to help control the surgical instruments. This allows for more precise movements of surgical instruments. The uterus is cut into small pieces and removed through the incisions or removed through the vagina. RISKS AND COMPLICATIONS  Possible complications associated with this procedure include:  Bleeding and risk of blood transfusion. Tell your health care provider if you do not want to receive any blood products.  Blood clots in the legs or lung.  Infection.  Injury to surrounding organs.  Problems or side effects related to anesthesia.  Conversion to an abdominal hysterectomy from one of the other techniques. WHAT TO EXPECT AFTER A HYSTERECTOMY  You will be given pain medicine.  You will need to have someone with you for the first 3-5 days after you go home.  You will need to follow up with your surgeon in 2-4 weeks after surgery to evaluate your progress.  You may have early menopause symptoms such as hot flashes, night sweats, and insomnia.  If you had a hysterectomy for a problem that was not cancer or not a condition that could lead to cancer, then you no longer need Pap tests. However, even if you no longer need a Pap test, a regular exam is a good idea to make sure no other problems are starting. Document Released: 04/15/2001 Document Revised: 08/10/2013 Document Reviewed: 06/27/2013 [facility] Patient Information 2015 [place], [place]. This information is not intended to replace advice given to you by your health care  provider. Make sure you discuss any questions you have with your health care provider.

## 2014-09-04 ENCOUNTER — Ambulatory Visit (HOSPITAL_COMMUNITY)
Admission: RE | Admit: 2014-09-04 | Discharge: 2014-09-04 | Disposition: A | Discharge status: Home / Self Care | Payer: Medicare Other | Source: Ambulatory Visit | Attending: Gynecologic Oncology | Admitting: Gynecologic Oncology

## 2014-09-04 ENCOUNTER — Encounter (HOSPITAL_COMMUNITY): Payer: Self-pay

## 2014-09-04 DIAGNOSIS — Z01818 Encounter for other preprocedural examination: Secondary | ICD-10-CM | POA: Insufficient documentation

## 2014-09-04 DIAGNOSIS — C541 Malignant neoplasm of endometrium: Secondary | ICD-10-CM | POA: Diagnosis not present

## 2014-09-04 MED ORDER — IOHEXOL 300 MG/ML  SOLN
50.0000 mL | Freq: Once | INTRAMUSCULAR | Status: AC | PRN
  Administered 2014-09-04: 50 mL via ORAL

## 2014-09-04 MED ORDER — IOHEXOL 300 MG/ML  SOLN
100.0000 mL | Freq: Once | INTRAMUSCULAR | Status: AC | PRN
  Administered 2014-09-04: 100 mL via INTRAVENOUS

## 2014-09-15 HISTORY — PX: PELVIC AND PARA-AORTIC LYMPH NODE DISSECTION: SHX2190

## 2014-09-15 HISTORY — PX: ROBOTIC ASSISTED LAPAROSCOPIC HYSTERECTOMY AND SALPINGECTOMY: SHX6379

## 2014-10-02 ENCOUNTER — Telehealth: Payer: Self-pay | Admitting: *Deleted

## 2014-10-02 ENCOUNTER — Other Ambulatory Visit: Payer: Self-pay | Admitting: *Deleted

## 2014-10-02 DIAGNOSIS — C541 Malignant neoplasm of endometrium: Secondary | ICD-10-CM

## 2014-10-02 NOTE — Telephone Encounter (Signed)
Called pt for appt for post-op check with Dr. Alycia Rossetti. Pt agreeable to come 10/19/14 at 2:45pm

## 2014-10-03 ENCOUNTER — Telehealth: Payer: Self-pay | Admitting: Oncology

## 2014-10-03 NOTE — Telephone Encounter (Signed)
LEFT MESSAGE FOR PATIENT TO RETURN CALL TO SCHEDULE NP APPT.  °

## 2014-10-04 ENCOUNTER — Telehealth: Payer: Self-pay | Admitting: Oncology

## 2014-10-04 NOTE — Telephone Encounter (Signed)
S/W PATIENT AND GAVE NP APPT FOR 12/09 @ 3 W/DR. [name], CHEMO EDU 12/04 @ 9. WELCOME PACKET MAILED [employer]

## 2014-10-06 ENCOUNTER — Ambulatory Visit: Payer: Medicare Other

## 2014-10-10 ENCOUNTER — Other Ambulatory Visit: Payer: Self-pay | Admitting: *Deleted

## 2014-10-10 DIAGNOSIS — C541 Malignant neoplasm of endometrium: Secondary | ICD-10-CM

## 2014-10-11 ENCOUNTER — Encounter: Payer: Self-pay | Admitting: Oncology

## 2014-10-11 ENCOUNTER — Ambulatory Visit: Payer: Medicare Other

## 2014-10-11 ENCOUNTER — Ambulatory Visit (HOSPITAL_BASED_OUTPATIENT_CLINIC_OR_DEPARTMENT_OTHER): Payer: Medicare Other | Admitting: Oncology

## 2014-10-11 ENCOUNTER — Other Ambulatory Visit (HOSPITAL_BASED_OUTPATIENT_CLINIC_OR_DEPARTMENT_OTHER): Payer: Medicare Other

## 2014-10-11 ENCOUNTER — Other Ambulatory Visit: Payer: Self-pay | Admitting: Oncology

## 2014-10-11 VITALS — BP 119/57 | HR 79 | Temp 99.1°F | Resp 18 | Ht 63.0 in | Wt 136.1 lb

## 2014-10-11 DIAGNOSIS — F17209 Nicotine dependence, unspecified, with unspecified nicotine-induced disorders: Secondary | ICD-10-CM

## 2014-10-11 DIAGNOSIS — B001 Herpesviral vesicular dermatitis: Secondary | ICD-10-CM

## 2014-10-11 DIAGNOSIS — F319 Bipolar disorder, unspecified: Secondary | ICD-10-CM

## 2014-10-11 DIAGNOSIS — C541 Malignant neoplasm of endometrium: Secondary | ICD-10-CM

## 2014-10-11 DIAGNOSIS — Z23 Encounter for immunization: Secondary | ICD-10-CM

## 2014-10-11 DIAGNOSIS — F3160 Bipolar disorder, current episode mixed, unspecified: Secondary | ICD-10-CM

## 2014-10-11 DIAGNOSIS — Z720 Tobacco use: Secondary | ICD-10-CM

## 2014-10-11 DIAGNOSIS — D649 Anemia, unspecified: Secondary | ICD-10-CM

## 2014-10-11 HISTORY — DX: Bipolar disorder, unspecified: F31.9

## 2014-10-11 HISTORY — DX: Nicotine dependence, unspecified, with unspecified nicotine-induced disorders: F17.209

## 2014-10-11 LAB — COMPREHENSIVE METABOLIC PANEL (CC13)
ALT: 12 U/L (ref 0–55)
ANION GAP: 12 meq/L — AB (ref 3–11)
AST: 12 U/L (ref 5–34)
Albumin: 3.8 g/dL (ref 3.5–5.0)
Alkaline Phosphatase: 62 U/L (ref 40–150)
BUN: 8.2 mg/dL (ref 7.0–26.0)
CALCIUM: 9.9 mg/dL (ref 8.4–10.4)
CHLORIDE: 109 meq/L (ref 98–109)
CO2: 24 meq/L (ref 22–29)
CREATININE: 1 mg/dL (ref 0.6–1.1)
EGFR: 63 mL/min/{1.73_m2} — AB (ref >90)
GLUCOSE: 107 mg/dL (ref 70–140)
Potassium: 3.8 mEq/L (ref 3.5–5.1)
Sodium: 145 mEq/L (ref 136–145)
Total Bilirubin: 0.37 mg/dL (ref 0.20–1.20)
Total Protein: 7.5 g/dL (ref 6.4–8.3)

## 2014-10-11 LAB — CBC WITH DIFFERENTIAL/PLATELET
BASO%: 1 % (ref 0.0–2.0)
BASOS ABS: 0.1 10*3/uL (ref 0.0–0.1)
EOS%: 1.7 % (ref 0.0–7.0)
Eosinophils Absolute: 0.1 10*3/uL (ref 0.0–0.5)
HEMATOCRIT: 36.1 % (ref 34.8–46.6)
HEMOGLOBIN: 11.7 g/dL (ref 11.6–15.9)
LYMPH#: 2.6 10*3/uL (ref 0.9–3.3)
LYMPH%: 31 % (ref 14.0–49.7)
MCH: 28.7 pg (ref 25.1–34.0)
MCHC: 32.3 g/dL (ref 31.5–36.0)
MCV: 88.8 fL (ref 79.5–101.0)
MONO#: 0.4 10*3/uL (ref 0.1–0.9)
MONO%: 4.7 % (ref 0.0–14.0)
NEUT#: 5.1 10*3/uL (ref 1.5–6.5)
NEUT%: 61.6 % (ref 38.4–76.8)
PLATELETS: 436 10*3/uL — AB (ref 145–400)
RBC: 4.06 10*6/uL (ref 3.70–5.45)
RDW: 14.6 % — ABNORMAL HIGH (ref 11.2–14.5)
WBC: 8.3 10*3/uL (ref 3.9–10.3)

## 2014-10-11 MED ORDER — DOCUSATE SODIUM 100 MG PO CAPS: 100.0000 mg | ORAL_CAPSULE | Freq: Two times a day (BID) | ORAL | Status: DC

## 2014-10-11 MED ORDER — ONDANSETRON 8 MG PO TBDP: 8.0000 mg | ORAL_TABLET | Freq: Three times a day (TID) | ORAL | Status: DC | PRN

## 2014-10-11 MED ORDER — INFLUENZA VAC SPLIT QUAD 0.5 ML IM SUSY
0.5000 mL | PREFILLED_SYRINGE | Freq: Once | INTRAMUSCULAR | Status: AC
  Administered 2014-10-11: 0.5 mL via INTRAMUSCULAR
  Filled 2014-10-11: qty 0.5

## 2014-10-11 MED ORDER — DEXAMETHASONE 4 MG PO TABS: ORAL_TABLET | ORAL | Status: DC

## 2014-10-11 MED ORDER — FERROUS FUMARATE 325 (106 FE) MG PO TABS: ORAL_TABLET | ORAL | Status: DC

## 2014-10-11 MED ORDER — ACYCLOVIR 5 % EX OINT: TOPICAL_OINTMENT | CUTANEOUS | Status: DC

## 2014-10-11 NOTE — Progress Notes (Signed)
Checked in new patient with no issues. She has medicare and medicaid. She has not been traveling and has appt card

## 2014-10-11 NOTE — Progress Notes (Signed)
[facility] NEW PATIENT EVALUATION   Name: Laurie Benson Date: 10/11/2014 MRN: [id number] DOB: 1966-02-16  REFERRING PHYSICIAN: Paola Gehrig DG:LOVF Renelda Mom RIchardson (gyn [place]), Erroll Luna, Sherrie Mustache, PCP [facility], GI in [place], Psychiatry thru [facility] in [place] Has not been referred to radiation oncology as yet.  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   HISTORY OF PRESENT ILLNESS:Laurie Benson is a 48 y.o. female who is seen in consultation, together with son, at the request of Dr Nancy Marus, for consideration of adjuvant chemotherapy for recently diagnosed endometrial carcinoma. History is from this EMR, outside records from [facility] reviewed by MD, History also from patient and son, tho note patient is rather poor historian and son does not know all of her information. Patient is unable to give me names of several physicians involved; son will try to get this information and bring to next appointment. She is to see Dr Alycia Rossetti again on 10-19-14.  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 Patient does not recall that she saw medical oncology then. She has yearly colonoscopies for colon polyps. Per son, she has bipolar disorder and PTSD; per EMR, major depressive disorder.  Patient had very heavy vaginal bleeding perhaps a year ago, seen by gyn in [place] and recalls that PAP was ok. She continued to have heavy menses and spotting between periods, seen back by Dr Ellouise Newer with ultrasound 07-27-14 which showed uterus 8x5x5 cm and biopsy, with 2 cm endometrial thickness and 2 cm intramural fibroid, normal appearing ovaries. Endometrial biopsy by Dr Marvel Plan 08-17-14 revealed FIGO grade 2 endometrial adenocarcinoma, with immunostains for p53 and p16 focally + supporting  possible serous papillary component. She was seen in consultation by Dr Denman George on 08-31-14, with CT scheduled and recommendation for surgery followed possibly by adjuvant therapy. Genetics testing was also recommended, with concern for Lynch syndrome. CT AP 09-04-14 had LLL pulmonary scaring with lung bases otherwise clear, 1.6 cm hypodense lesion inferior posterior right hepatic lobe of unclear etiology, no adenopathy, no free fluid, ovaries normal, inhomogeneity of endometrium. Due to availability for surgery, she was referred to [facility], with surgery 09-15-14 by Dr Alycia Rossetti which was robotic assisted total laparoscopic hysterectomy, BSO, bilateral pelvic and para-arotic lymphednectomy. Pathology [facility] [phone]) from 09-15-14 found mixed adenocarcinoma ( 70% endometroid grade 3 and 30% serous grade 2) involving endometrium, with total size 6.5 x 2.8 x 0.8 cm and 2 mm myometrial invasion where wall 2.2 cm thick (9%). There was no involvement of serosa, lower uterine segment, cervix, adnexa or ovaries. There was no LVSI. Nodes: 3 right periaortic, no identified left periaortic (due to anatomy irregular on left at time of surgery), 5 right pelvic and 3 left pelvic nodes negative. ER + 90%. Pathologic stage IA. Operative note does not describe posterior liver region. Her case was discussed at [place] at [facility] on 10-04-14, with recommendation for adjuvant carboplatin taxol, and vaginal brachytherapy. Recommendation was also for IHC testing of the path material, which I understand is to be done at [facility], however that information is not located now in [place].  Patient was DC home day after surgery. She saw Dr Alycia Rossetti on 10-02-14 with small left vaginal cuff separation. She and son attended chemotherapy education class prior to my visit.   REVIEW OF SYSTEMS:  Vaginal leakage has resolved. No pain,  no fever. Intermittent nausea, is prone to nausea per son. Is not lifting yet but has  resumed driving and is fairly active at home. Fever blister lower left lip, frequent fever blisters. Denies GERD. No HA. Good visual acuity with glasses. No sinus symptoms, no problems hearing. Full dentures. No respiratoyr symptoms. Does not have mammograms with bilateral mastectomies; reconstructions 2013. No cardiac symptoms. Usual weight ~ 130 lbs. No other bleeding. Some constipation but bowels moving ~ daily. No swelling LE. Did not use LMW heparin after laparoscopic surgery. No hot flashes since surgery. No peripheral neuropathy. No arthritis symptoms. Bladder ok. Remainder of full 10 point review of systems negative.   ALLERGIES: Tetracyclines & related  PAST MEDICAL/ SURGICAL HISTORY:    Right DCIS treated with mastectomy and right sentinel node2-22-2012 by Dr Brantley Stage, 3.5 cm per path tho EMR states 10 cm (?), deep margin widely clear and superior margin 0.1 cm, node negative. Apparently not seen by medical oncology or radiation oncology. Prophylactic left mastectomy. Bilateral breast reconstructions completed by Dr Migdalia Dk [id number].   Colon polyps: yearly colonoscopy done by GI in [place], due again ~ early 2016  Major depression or possibly bipolar disorder. Per son, also PTSD  C sections x 2, + 3 vaginal deliveries, gravida 5 para 6 with one set of twins    CURRENT MEDICATIONS: reviewed as listed now in EMR. I have asked patient and son to bring medications to next visit. Note she is reportedly on Klonopin tid, Abilify, Lexapro, Seroquel and Prozac. Patient tells me that Seroquel is recent, for sleep difficulty, and that she has been on all of the other medications for "a long time".  Uses lysine for fever blisters - ok to continue. She can increase colace to bid. Will add Hemocyte or ferrous fumarate daily.  Zovirax ointment to fever blister 5x daily.  Zofran 8 mg q 8 hr prn nausea, ok with other meds per med interaction report now. Will discuss other antiemetics with [place]  pharmacists, particularly with 5 psychiatric meds as noted. Flu vaccine done today.  PHARMACY [facility] [address] [place]   SOCIAL HISTORY:  Lives with this son, age 10, in [place]. Patient previously worked at [employer], now disabled for psychiatric reasons.  6 children ages 67 - 78, ~ 35 grands. This son is only child locally, others in [place], [place], [place].  Has smoked 1 ppd x 10 years. Denies ETOH or drugs. This son not presently employed; he has twin sister.    FAMILY HISTORY:  Father died from colon cancer age 83 Paternal uncle had brain cancer age 68 Paternal grandmother "cancer in eye" 2 brothers and 1/2 brother healthy 6 children and 10 grands healthy          PHYSICAL EXAM:  height is 5' 3"  (1.6 m) and weight is 136 lb 1.6 oz (61.735 kg). Her oral temperature is 99.1 F (37.3 C). Her blood pressure is 119/57 and her pulse is 79. Her respiration is 18 and oxygen saturation is 100%.  Alert, pleasant, cooperative, initially anxious but more relaxed by completion of visit, appears stated age, neatly groomed, NAD. Son very supportive and helpful.   HEENT:PERRL, not icteric. Edentulous with plates. Neck supple without JVD or thyroid mass. Slight erythema at left lower lip laterally. Normal hair pattern.  RESPIRATORY: respirations not labored RA. Lungs clear to A and P.  CARDIAC/ VASCULAR: heart RRR no gallop. Peripheral pulses intact and symmetrical. No LE swelling, cords, tenderness  ABDOMEN: soft, not tender, not  distended. Laparoscopic incisions all closed and not remarkable. No HSM or organomegaly. Normally active BS  LYMPH NODES:no cervical, supraclavicular, axillary or inguinal adenopathy  BREASTS: bilateral reconstructions without evidence of local recurrence, not tight, fairly symmetrical  NEUROLOGIC: CN, motor, sensory, cerebellar intact. Speech fluent and appropriate. Affect seems appropriate. Good eye contact.  SKIN: without rash, ecchymosis  otherwise  MUSCULOSKELETAL:back not tender. Joints not remarkable UE and LE    LABORATORY DATA:  Results for orders placed or performed in visit on 10/11/14 (from the past 48 hour(s))  CBC with Differential     Status: Abnormal   Collection Time: 10/11/14  3:02 PM  Result Value Ref Range   WBC 8.3 3.9 - 10.3 10e3/uL   NEUT# 5.1 1.5 - 6.5 10e3/uL   HGB 11.7 11.6 - 15.9 g/dL   HCT 36.1 34.8 - 46.6 %   Platelets 436 (H) 145 - 400 10e3/uL   MCV 88.8 79.5 - 101.0 fL   MCH 28.7 25.1 - 34.0 pg   MCHC 32.3 31.5 - 36.0 g/dL   RBC 4.06 3.70 - 5.45 10e6/uL   RDW 14.6 (H) 11.2 - 14.5 %   lymph# 2.6 0.9 - 3.3 10e3/uL   MONO# 0.4 0.1 - 0.9 10e3/uL   Eosinophils Absolute 0.1 0.0 - 0.5 10e3/uL   Basophils Absolute 0.1 0.0 - 0.1 10e3/uL   NEUT% 61.6 38.4 - 76.8 %   LYMPH% 31.0 14.0 - 49.7 %   MONO% 4.7 0.0 - 14.0 %   EOS% 1.7 0.0 - 7.0 %   BASO% 1.0 0.0 - 2.0 %  Comprehensive metabolic panel     Status: Abnormal   Collection Time: 10/11/14  3:03 PM  Result Value Ref Range   Sodium 145 136 - 145 mEq/L   Potassium 3.8 3.5 - 5.1 mEq/L   Chloride 109 98 - 109 mEq/L   CO2 24 22 - 29 mEq/L   Glucose 107 70 - 140 mg/dl   BUN 8.2 7.0 - 26.0 mg/dL   Creatinine 1.0 0.6 - 1.1 mg/dL   Total Bilirubin 0.37 0.20 - 1.20 mg/dL   Alkaline Phosphatase 62 40 - 150 U/L   AST 12 5 - 34 U/L   ALT 12 0 - 55 U/L   Total Protein 7.5 6.4 - 8.3 g/dL   Albumin 3.8 3.5 - 5.0 g/dL   Calcium 9.9 8.4 - 10.4 mg/dL   Anion Gap 12 (H) 3 - 11 mEq/L   EGFR 63 (L) >90 ml/min/1.73 m2    Comment: eGFR is calculated using the CKD-EPI Creatinine Equation (2009)      PATHOLOGY: From [facility] and from [facility] as in history above. I have asked gyn onc NP to follow up IHC testing results from [facility]  RADIOGRAPHY: [place]   09-04-14  COMPARISON: No similar prior exam is available at this institution for comparison or on [facility] PACS.  FINDINGS: Lower chest: Curvilinear left lower lobe scarring is  noted. Lung bases are otherwise clear.  Hepatobiliary: There is an ill-defined 1.6 cm hypodense lesion at the inferior most aspect of the posterior segment right hepatic lobe image 39 series 2. This is also seen on the renal delayed images, image 24 series 5. There is adjacent dense streak artifact from oral contrast and streak artifact from umbilical piercing. Gallbladder is unremarkable. No intrahepatic ductal dilatation or common duct dilatation.  Pancreas: Normal  Spleen: Normal  Adrenals/Urinary Tract: Adrenal glands and kidneys are unremarkable.  Stomach/Bowel: No bowel wall thickening  or focal segmental dilatation is identified. The appendix is normal.  Vascular/Lymphatic: No free fluid or lymphadenopathy. Atherosclerotic aortic calcification without aneurysm. No evidence for omental or peritoneal disease.  Reproductive: Ovaries are normal. Inhomogeneity of the endometrium is identified but this is poorly visualized at CT.  Other: No pelvic lymphadenopathy or fluid. Bladder is normal.  Musculoskeletal: No acute osseous abnormality or lytic or sclerotic osseous lesion.  IMPRESSION: Inhomogeneous endometrium which may correspond to the reported history of endometrial cancer, but poorly evaluated at CT.  No CT evidence for intra-abdominal or pelvic metastatic disease. However, there is an ill-defined hypodense possible posterior segment right hepatic lobe mass, for which abdominal MRI with contrast according to liver mass protocol is recommended for further evaluation. This could be artifactual due to dense streak artifact from oral contrast, but could be definitively characterized at MRI.      DISCUSSION: we have reviewed all of history above. We have discussed rationale for adjuvant chemotherapy, and have reviewed taxol and carboplatin information, including nausea, decrease in blood counts, possible taxol aches, possible peripheral neuropathy and  premedication decadron. We have discussed possible steroid side effects; I have asked RN to speak with her by phone prior to first treatment, to be sure she understands timing of decadron and to remind her to take with food. I have mentioned possible better tolerance of weekly taxol vs q 3 week dosing of that drug, however she prefers less frequent office visits and wants to try q 3 week schedule. Son wanted Thurs or Friday treatment so [place], however I have strongly recommended that they not go out of town just after first chemo. Peripheral IV access seems adequate. They understand that they can call this office at any time if questions or concerns. Other medications discussed as above. Message to [facility] pharmacists as I am not certain which other antiemetics might be most appropriate given five psych medications as above.  Patient and son prefer to begin treatment after she sees Dr Alycia Rossetti on 12-17 and after Christmas  Smoking cessation counseling done, for patient and son.  Flu vaccine given today.    IMPRESSION / PLAN:  1. 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 Post laparoscopic hysterectomy, BSO, nodes at [facility] 09-15-14. IHC testing on [facility] path pending. Plan q 3 week carbo taxol beginning week after Christmas. I will see her back ~ 1 week and ~ 2 weeks after first treatment, with labs. 2.personal hx DCIS right breast, post right mastectomy with sentinel node and prophylactic left mastectomy 12-2010, with bilateral reconstructions 3. Mild anemia with history of heavy vaginal bleeding prior to diagnosis: add oral iron 4.ongoing tobacco, 10 pack year history. Encouraged both patient and son to stop smoking 5. Colon polyps: enough that she is followed with yearly colonoscopies. I will  send my information to GI physician when son lets me know that MD. 6.Colon cancer in father who died age 74, brain cancer in paternal uncle age 63s and "eye cancer" in paternal grandmother. Follow up IHC information, will suggest genetics counseling regardless. 7.major depressive disorder or bipolar disorder: I will be in touch with mental health clinic to let them know situation when I learn which MD. 8. Previous C sections x 2 9.frequent HSV lip lesions by history: start with topical acyclovir, but may need to prophylax  with oral if that is not sufficient.     Patient and accompanying individuals have had questions answered to their satisfaction and are in agreement with plan above; verbal consent for chemotherapy obtained.  They can contact this office for questions or concerns at any time prior to next scheduled visit.  Chemo orders entered using labs from today; will confirm by labs within one week of first treatment.  Time spent  75 + min, including >50% discussion and coordination of care.    Lateef Juncaj P, MD 10/11/2014 5:00 PM

## 2014-10-12 ENCOUNTER — Telehealth: Payer: Self-pay | Admitting: *Deleted

## 2014-10-12 ENCOUNTER — Telehealth: Payer: Self-pay | Admitting: Oncology

## 2014-10-12 NOTE — Telephone Encounter (Signed)
, °

## 2014-10-12 NOTE — Telephone Encounter (Signed)
Per staff message and POF I have scheduled appts. Advised scheduler of appts. JMW  

## 2014-10-14 ENCOUNTER — Other Ambulatory Visit: Payer: Self-pay | Admitting: Oncology

## 2014-10-16 ENCOUNTER — Telehealth: Payer: Self-pay | Admitting: *Deleted

## 2014-10-16 NOTE — Telephone Encounter (Signed)
Yevonne Aline at [facility]- fax [phone], phone [phone]  Simeon Craft at [facility] [phone], phone [phone]  Faxed office notes from Dr Marko Plume 10/11/14.

## 2014-10-16 NOTE — Telephone Encounter (Signed)
-----   Message from Gordy Levan, MD sent at 10/14/2014  5:11 PM EST ----- Please call [facility] health in [place]. I need name of provider who sees her there, to send medical oncology information. Please send my note 10-11-14 to that MD  Please call [facility] and find out which MD sees her there, then send my note 10-11-14 to that MD.  Please put the names in RN note so I can route information in future.  thanks

## 2014-10-18 ENCOUNTER — Telehealth: Payer: Self-pay | Admitting: Oncology

## 2014-10-18 NOTE — Telephone Encounter (Signed)
Medical Oncology   MD phoned [facility] in [place], LM on nurse line requesting call back (pager, office cell or office) to let me know which provider so that I can communicate re upcoming chemotherapy.  Godfrey Pick, MD

## 2014-10-18 NOTE — Telephone Encounter (Signed)
Medical Oncology  Dr Kennyth Lose with [facility] mental health in [place] returned my call.  Clarified meds: NOT on lexapro. Is on Klonopin 2 mg tid, Prozac 40 mg q AM, Seroquil 100 mg qhs and Abilify 20 mg daily. Their diagnoses are Bipolar 2 (gets hypomanic but never to full manic), history of PTSD and anxiety.  Chinita Pester [phone] RN to correct med list in this EMR and to tell patient/ son to bring all meds to next appointment here.  Godfrey Pick, MD

## 2014-10-19 ENCOUNTER — Encounter: Payer: Self-pay | Admitting: Gynecologic Oncology

## 2014-10-19 ENCOUNTER — Ambulatory Visit: Payer: Medicare Other | Attending: Gynecologic Oncology | Admitting: Gynecologic Oncology

## 2014-10-19 ENCOUNTER — Telehealth: Payer: Self-pay

## 2014-10-19 VITALS — Temp 98.7°F | Ht 63.0 in | Wt 138.5 lb

## 2014-10-19 DIAGNOSIS — F329 Major depressive disorder, single episode, unspecified: Secondary | ICD-10-CM | POA: Diagnosis not present

## 2014-10-19 DIAGNOSIS — Z90710 Acquired absence of both cervix and uterus: Secondary | ICD-10-CM | POA: Insufficient documentation

## 2014-10-19 DIAGNOSIS — Z79899 Other long term (current) drug therapy: Secondary | ICD-10-CM | POA: Diagnosis not present

## 2014-10-19 DIAGNOSIS — Z90722 Acquired absence of ovaries, bilateral: Secondary | ICD-10-CM | POA: Diagnosis not present

## 2014-10-19 DIAGNOSIS — Z9013 Acquired absence of bilateral breasts and nipples: Secondary | ICD-10-CM | POA: Insufficient documentation

## 2014-10-19 DIAGNOSIS — Z9079 Acquired absence of other genital organ(s): Secondary | ICD-10-CM | POA: Insufficient documentation

## 2014-10-19 DIAGNOSIS — F17210 Nicotine dependence, cigarettes, uncomplicated: Secondary | ICD-10-CM | POA: Insufficient documentation

## 2014-10-19 DIAGNOSIS — F419 Anxiety disorder, unspecified: Secondary | ICD-10-CM | POA: Insufficient documentation

## 2014-10-19 DIAGNOSIS — C55 Malignant neoplasm of uterus, part unspecified: Secondary | ICD-10-CM | POA: Insufficient documentation

## 2014-10-19 DIAGNOSIS — C541 Malignant neoplasm of endometrium: Secondary | ICD-10-CM

## 2014-10-19 MED ORDER — PROCHLORPERAZINE MALEATE 10 MG PO TABS: 10.0000 mg | ORAL_TABLET | Freq: Four times a day (QID) | ORAL | Status: DC | PRN

## 2014-10-19 NOTE — Progress Notes (Signed)
Consult Note: Gyn-Onc  Laurie Benson 48 y.o. female  CC:  Chief Complaint  Patient presents with  . Routine Post Op    Follow up    HPI: Laurie Benson initially presented with grade 2 vs serous endometrial cancer. The patient reports that in the past 12 months her previously regular menses have become heavy and irregular. This prompted Dr Marvel Plan to perform an ultrasound of the pelvis on 07/27/14 which revealed a uterus measuring 8x5x5cm with a 2cm intramural fibroid, and normal appearing ovaries. The endometrial thickness was 2cm. 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 She also reports a history of multiple colonic polyps and has had several resections of these. She is recommended to continue annual colonoscopies.   INTERVAL HISTORY: On 09/15/14 she underwent TRH/BSO and appropriate staging.   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%) (see comment)   Histologic grade: FIGO grade 3 (endometrioid adenocarcinoma: FIGO grade 2; serous carcinoma FIGO grade 3)  Tumor site: endometrium, anterior and posterior   Tumor size (gross): 6.5 x 2.8 x 0.8 cm   Myometrial invasion: Inner half  Depth: 2 mm Wall thickness: 2.2 cm Percent: 9% (A12)  Serosal involvement: not identified   Lower uterine segment involvement: not identified   Cervical involvement: not identified   Adnexal  involvement: not identified   Other involved sites: not applicable   Cervical/vaginal margin and distance: widely negative (>3 cm)   Lymphovascular space invasion: not identified   Regional lymph nodes (see other specimens): Total number involved: 0  Total number examined: 11  Interval History:  I last saw her on November 30 at [facility]. I called her with her pathology she is complaining of profuse vaginal discharge. At the time of her examination shows small separation of the vaginal cuff on the left side. Appropriate counseling was given. She was seen by Dr. Marko Plume is a new patient on 10/11/14.  She comes in today for her postoperative check.  Review of Systems  Current Meds:  Outpatient Encounter Prescriptions as of 10/19/2014  Medication Sig  . acyclovir ointment (ZOVIRAX) 5 % Apply to fever blister 5 x daily.  . ARIPiprazole (ABILIFY) 10 MG tablet Take 10 mg by mouth daily.  Marland Kitchen aspirin-acetaminophen-caffeine (EXCEDRIN MIGRAINE) 250-250-65 MG per tablet Take 1 tablet by mouth every 6 (six) hours as needed.  . Choline Fenofibrate (FENOFIBRIC ACID) 135 MG CPDR   . clonazePAM (KLONOPIN) 2 MG tablet Take 2 mg by mouth 3 (three) times daily.   Marland Kitchen dexamethasone (DECADRON) 4 MG tablet Take 5 tabs with food 12 hrs and 6 hrs before Taxol treatment  . docusate sodium (COLACE) 100 MG capsule Take 1 capsule (100 mg total) by mouth 2 (two) times daily.  Marland Kitchen escitalopram (LEXAPRO) 20 MG tablet Take 20 mg by mouth daily.  . ferrous fumarate (HEMOCYTE - 106 MG FE) 325 (106 FE) MG TABS tablet Take 1 tablet daily on empty stomach with OJ  . FLUoxetine (PROZAC) 40 MG capsule Take 40 mg by mouth daily.   . folic acid (FOLVITE) 850  MCG tablet Take 400 mcg by mouth daily.  Marland Kitchen ibuprofen (ADVIL,MOTRIN) 600 MG tablet Take 600 mg by mouth every 6 (six) hours as needed for moderate pain.  Marland Kitchen ondansetron (ZOFRAN-ODT) 8 MG disintegrating tablet Take 1 tablet (8 mg total) by mouth every 8 (eight) hours as needed for  nausea or vomiting.  Marland Kitchen oxyCODONE-acetaminophen (PERCOCET) 5-325 MG per tablet Take 1 tablet by mouth every 4 (four) hours as needed.  . pravastatin (PRAVACHOL) 40 MG tablet   . prochlorperazine (COMPAZINE) 10 MG tablet Take 1 tablet (10 mg total) by mouth every 6 (six) hours as needed for nausea or vomiting.  Marland Kitchen QUEtiapine (SEROQUEL) 100 MG tablet Take 100 mg by mouth at bedtime.  . thiamine (VITAMIN B-1) 100 MG tablet Take 100 mg by mouth daily.  . vitamin B-12 (CYANOCOBALAMIN) 1000 MCG tablet Take 1,000 mcg by mouth daily.    Allergy:  Allergies  Allergen Reactions  . Tetracyclines & Related Swelling    Social Hx:   History   Social History  . Marital Status: Legally Separated    Spouse Name: N/A    Number of Children: N/A  . Years of Education: N/A   Occupational History  . Not on file.   Social History Main Topics  . Smoking status: Heavy Tobacco Smoker -- 1.00 packs/day    Types: Cigarettes  . Smokeless tobacco: Never Used  . Alcohol Use: Yes  . Drug Use: No  . Sexual Activity: Not Currently   Other Topics Concern  . Not on file   Social History Narrative    Past Surgical Hx:  Past Surgical History  Procedure Laterality Date  . Breast surgery  2012    bilat mastectomy-rt snbx  . Tubal ligation    . Cesarean section      x2  . Multiple tooth extractions    . Breast reconstruction  11/20/2011    Procedure: BREAST RECONSTRUCTION;  Surgeon: Theodoro Kos, DO;  Location: [facility];  Service: Plastics;  Laterality: Bilateral;  bilateral implant exchange for breast reconstruction and capsulectomy    Past Medical Hx:  Past Medical History  Diagnosis Date  . Anxiety   . Depression   . Complication of anesthesia     heard talking during teeth extraction  . Breast cancer   . Cancer 2012    bilat br ca  . Uterine cancer     Oncology Hx:    Breast cancer   11/20/2011 Initial Diagnosis Breast cancer    Endometrial cancer   08/31/2014  Initial Diagnosis Endometrial cancer   09/15/2014 Surgery TRH/BSO and staging. IA UPSC    Chemotherapy     Family Hx:  Family History  Problem Relation Age of Onset  . Cancer Father 60    colon  . Cancer Paternal Uncle 56    brain  . Cancer Paternal Grandmother     cancer of her "eyes"    Vitals:  There were no vitals taken for this visit.  Physical Exam: Well-nourished well-developed female in no acute distress. She seems slightly sedated today.  Abdomen: Well-healed surgical incisions. Abdomen is soft and nontender.  Pelvic: Normal female genitalia. The vaginal cuff is visualized. His well-healed. The small superficial separation of the vaginal cuff on the left side identified at her last visit subsequently healed. Bimanual examination reveals the vaginal cuff to be intact. There is no tenderness.   Assessment/Plan: [name] 49 y.o. 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 Her tumor was tested  for microsatellite instability. The tumor was MSH-6 negative, MSH-2 positive, PMS-2 positive, and MLH-1 positive. 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 We will schedule her for genetic counseling. She is scheduled to start her chemotherapy with Dr. Marko Plume on December 30. She knows that we will be scheduled to see her back during her treatment.  Elcie Pelster A., MD 10/19/2014, 3:25 PM

## 2014-10-19 NOTE — Telephone Encounter (Signed)
S/w Laurie Benson b/c she answered the phone. Told her NOT to take the zofran for nausea, it has a high potential to react with her other medications. Per Dr Marko Plume we will call in another medication, compazine. Told her to take her zofran back to pharmacy or throw it out so she does not accidentally take it. Asked her to bring her medications in their bottles to her next appt. with Dr Marko Plume. Confirmed her appt today w/Dr Alycia Rossetti.

## 2014-10-20 ENCOUNTER — Encounter: Payer: Self-pay | Admitting: Oncology

## 2014-10-20 ENCOUNTER — Other Ambulatory Visit: Payer: Self-pay | Admitting: Oncology

## 2014-10-20 ENCOUNTER — Encounter: Payer: Self-pay | Admitting: Gynecologic Oncology

## 2014-10-20 NOTE — Progress Notes (Signed)
Medical Oncology  Per discussions with [facility] pharmacists, will try EMEND and compazine as primary antiemetics with upcoming chemo, these due to drug interaction concerns with other antiemetics and her psychiatric medications. Financial staff made aware in case EMEND needs precert. Chemo orders for 11-01-14 adjusted.  Godfrey Pick, MD

## 2014-10-20 NOTE — Progress Notes (Signed)
Patient brought bag with medication bottles to her visit.  Medication list updated.

## 2014-10-25 ENCOUNTER — Telehealth: Payer: Self-pay | Admitting: Genetic Counselor

## 2014-10-25 NOTE — Telephone Encounter (Signed)
CALLED PATIENT TO SCHEDULE GENETIC APPT, PER PATIENT CALL BACK.

## 2014-10-29 ENCOUNTER — Other Ambulatory Visit: Payer: Self-pay | Admitting: Oncology

## 2014-10-29 DIAGNOSIS — C541 Malignant neoplasm of endometrium: Secondary | ICD-10-CM

## 2014-10-30 ENCOUNTER — Telehealth: Payer: Self-pay

## 2014-10-30 NOTE — Telephone Encounter (Signed)
-----   Message from Gordy Levan, MD sent at 10/29/2014 12:40 PM EST ----- For first taxol carbo 12-30. On 12-29, please call to remind patient/ son to take premed decadron. Be sure they have picked up antiemetics. Recommend she start Claritin daily 12-29 or 12-30  Sorry if this is a repeat message on this lady thanks

## 2014-10-30 NOTE — Telephone Encounter (Signed)
Called Laurie Benson to discuss premedication of decadron and antiemetics.  She had her son [name] come to the phone to review the information. The compazine was picked up and the Zofran was discarded as directed by Nelida Gores on 10-19-14. Reviewed to take decadron 5 tabs at 10 pm on 10-31-14 and 0400 on 11-01-14 with food. [name] repeated the instructions back to this nurse correctly x 2.  He wrote down the name of Claritin  10 mg to pick up generic tomorrow (Ask Pharmacist for assistance) and begin taking one daily to help with aches from Taxol Chemotherapy.

## 2014-11-01 ENCOUNTER — Other Ambulatory Visit (HOSPITAL_BASED_OUTPATIENT_CLINIC_OR_DEPARTMENT_OTHER): Payer: Medicare Other

## 2014-11-01 ENCOUNTER — Ambulatory Visit (HOSPITAL_BASED_OUTPATIENT_CLINIC_OR_DEPARTMENT_OTHER): Payer: Medicare Other | Admitting: Oncology

## 2014-11-01 ENCOUNTER — Ambulatory Visit (HOSPITAL_BASED_OUTPATIENT_CLINIC_OR_DEPARTMENT_OTHER): Payer: Medicare Other

## 2014-11-01 ENCOUNTER — Encounter: Payer: Self-pay | Admitting: Oncology

## 2014-11-01 VITALS — BP 110/59 | HR 89 | Temp 98.8°F | Resp 18 | Ht 63.0 in | Wt 141.9 lb

## 2014-11-01 DIAGNOSIS — Z5111 Encounter for antineoplastic chemotherapy: Secondary | ICD-10-CM

## 2014-11-01 DIAGNOSIS — Z853 Personal history of malignant neoplasm of breast: Secondary | ICD-10-CM | POA: Diagnosis not present

## 2014-11-01 DIAGNOSIS — Z8659 Personal history of other mental and behavioral disorders: Secondary | ICD-10-CM

## 2014-11-01 DIAGNOSIS — Z720 Tobacco use: Secondary | ICD-10-CM

## 2014-11-01 DIAGNOSIS — K635 Polyp of colon: Secondary | ICD-10-CM

## 2014-11-01 DIAGNOSIS — C541 Malignant neoplasm of endometrium: Secondary | ICD-10-CM

## 2014-11-01 DIAGNOSIS — F419 Anxiety disorder, unspecified: Secondary | ICD-10-CM

## 2014-11-01 DIAGNOSIS — D0511 Intraductal carcinoma in situ of right breast: Secondary | ICD-10-CM

## 2014-11-01 DIAGNOSIS — F3181 Bipolar II disorder: Secondary | ICD-10-CM | POA: Insufficient documentation

## 2014-11-01 DIAGNOSIS — F3160 Bipolar disorder, current episode mixed, unspecified: Secondary | ICD-10-CM

## 2014-11-01 HISTORY — DX: Bipolar II disorder: F31.81

## 2014-11-01 HISTORY — DX: Anxiety disorder, unspecified: F41.9

## 2014-11-01 HISTORY — DX: Personal history of other mental and behavioral disorders: Z86.59

## 2014-11-01 LAB — COMPREHENSIVE METABOLIC PANEL (CC13)
ALK PHOS: 46 U/L (ref 40–150)
ALT: 24 U/L (ref 0–55)
AST: 28 U/L (ref 5–34)
Albumin: 3.7 g/dL (ref 3.5–5.0)
Anion Gap: 13 mEq/L — ABNORMAL HIGH (ref 3–11)
BILIRUBIN TOTAL: 0.29 mg/dL (ref 0.20–1.20)
BUN: 10.8 mg/dL (ref 7.0–26.0)
CO2: 20 mEq/L — ABNORMAL LOW (ref 22–29)
CREATININE: 1 mg/dL (ref 0.6–1.1)
Calcium: 9.2 mg/dL (ref 8.4–10.4)
Chloride: 109 mEq/L (ref 98–109)
EGFR: 62 mL/min/{1.73_m2} — ABNORMAL LOW (ref >90)
Glucose: 187 mg/dl — ABNORMAL HIGH (ref 70–140)
Potassium: 3.5 mEq/L (ref 3.5–5.1)
Sodium: 142 mEq/L (ref 136–145)
Total Protein: 7 g/dL (ref 6.4–8.3)

## 2014-11-01 LAB — CBC WITH DIFFERENTIAL/PLATELET
BASO%: 0.8 % (ref 0.0–2.0)
Basophils Absolute: 0 10*3/uL (ref 0.0–0.1)
EOS ABS: 0 10*3/uL (ref 0.0–0.5)
EOS%: 0.1 % (ref 0.0–7.0)
HEMATOCRIT: 35.8 % (ref 34.8–46.6)
HGB: 11.4 g/dL — ABNORMAL LOW (ref 11.6–15.9)
LYMPH#: 0.7 10*3/uL — AB (ref 0.9–3.3)
LYMPH%: 12.5 % — ABNORMAL LOW (ref 14.0–49.7)
MCH: 28.1 pg (ref 25.1–34.0)
MCHC: 31.8 g/dL (ref 31.5–36.0)
MCV: 88.1 fL (ref 79.5–101.0)
MONO#: 0 10*3/uL — AB (ref 0.1–0.9)
MONO%: 0.6 % (ref 0.0–14.0)
NEUT%: 86 % — ABNORMAL HIGH (ref 38.4–76.8)
NEUTROS ABS: 5.1 10*3/uL (ref 1.5–6.5)
PLATELETS: 315 10*3/uL (ref 145–400)
RBC: 4.07 10*6/uL (ref 3.70–5.45)
RDW: 14.9 % — AB (ref 11.2–14.5)
WBC: 5.9 10*3/uL (ref 3.9–10.3)

## 2014-11-01 MED ORDER — SODIUM CHLORIDE 0.9 % IV SOLN
453.0000 mg | Freq: Once | INTRAVENOUS | Status: AC
  Administered 2014-11-01: 450 mg via INTRAVENOUS
  Filled 2014-11-01: qty 45

## 2014-11-01 MED ORDER — PROCHLORPERAZINE EDISYLATE 5 MG/ML IJ SOLN: 10.0000 mg | Freq: Four times a day (QID) | INTRAMUSCULAR | Status: DC | PRN

## 2014-11-01 MED ORDER — FAMOTIDINE IN NACL 20-0.9 MG/50ML-% IV SOLN
20.0000 mg | Freq: Once | INTRAVENOUS | Status: AC
  Administered 2014-11-01: 20 mg via INTRAVENOUS

## 2014-11-01 MED ORDER — DIPHENHYDRAMINE HCL 50 MG/ML IJ SOLN
INTRAMUSCULAR | Status: AC
  Filled 2014-11-01: qty 1

## 2014-11-01 MED ORDER — SODIUM CHLORIDE 0.9 % IV SOLN
150.0000 mg | Freq: Once | INTRAVENOUS | Status: AC
  Administered 2014-11-01: 150 mg via INTRAVENOUS
  Filled 2014-11-01: qty 5

## 2014-11-01 MED ORDER — FAMOTIDINE IN NACL 20-0.9 MG/50ML-% IV SOLN
INTRAVENOUS | Status: AC
  Filled 2014-11-01: qty 50

## 2014-11-01 MED ORDER — DIPHENHYDRAMINE HCL 50 MG/ML IJ SOLN
50.0000 mg | Freq: Once | INTRAMUSCULAR | Status: AC
  Administered 2014-11-01: 50 mg via INTRAVENOUS

## 2014-11-01 MED ORDER — DEXAMETHASONE SODIUM PHOSPHATE 20 MG/5ML IJ SOLN
INTRAMUSCULAR | Status: AC
  Filled 2014-11-01: qty 5

## 2014-11-01 MED ORDER — PACLITAXEL CHEMO INJECTION 300 MG/50ML
135.0000 mg/m2 | Freq: Once | INTRAVENOUS | Status: AC
  Administered 2014-11-01: 222 mg via INTRAVENOUS
  Filled 2014-11-01: qty 37

## 2014-11-01 MED ORDER — DEXAMETHASONE SODIUM PHOSPHATE 20 MG/5ML IJ SOLN
12.0000 mg | Freq: Once | INTRAMUSCULAR | Status: AC
  Administered 2014-11-01: 12 mg via INTRAVENOUS

## 2014-11-01 MED ORDER — SODIUM CHLORIDE 0.9 % IV SOLN
Freq: Once | INTRAVENOUS | Status: AC
  Administered 2014-11-01: 10:00:00 via INTRAVENOUS

## 2014-11-01 NOTE — Progress Notes (Signed)
OFFICE PROGRESS NOTE   11/01/2014   Physicians:Paola Gehrig/ [name], Gerald Stabs RIchardson (gyn [place]), Erroll Luna, Louretta Parma [place], PCP [facility] in [place] (psychiatry, [facility] in [place]) Patient did not bring names of other MDs today, tho she has that information and will try to bring it next visit.  Has not been referred to radiation oncology as yet.  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 Plan is for carboplatin and taxol q 3 weeks x 6 beginning 11-01-14. She saw Dr Alycia Rossetti on 10-19-14 with vaginal cuff healed; genetics counseling recommended for possible HNPCC (referral not made yet).  Situation is complicated by psychiatric problems for which she is on multiple medications, tho these issues seem stable at present (see my phone note with psychiatrist 10-18-14). Due to drug interactions, antiemetics will be EMEND and prn compazine. Patient did take decadron as instructed 12 hrs and 6 hrs prior to today's treatment. SHe has not started claritin as recommended, this for taxol aches.  Patient seems to be doing well from surgery, with appetite and energy improved, still limiting lifting. She has had no bleeding, no fever, no new or different pain and bowels are ok. She complains of pain in hips bilaterally when she walks distances, this a chronic problem for which she has had steroid injections as recently as 8 months ago.  No PAC Needs genetics counseling, not discussed today Flu vaccine done   ONCOLOGIC HISTORY   Breast cancer   11/20/2011 Initial Diagnosis Breast cancer    Endometrial cancer   08/31/2014 Initial Diagnosis Endometrial cancer   09/15/2014 Surgery TRH/BSO and staging. IA UPSC    Chemotherapy   Patient had very heavy vaginal bleeding perhaps a year ago, seen by gyn in [place] and recalls that  PAP was ok. She continued to have heavy menses and spotting between periods, seen back by Dr Ellouise Newer with ultrasound 07-27-14 which showed uterus 8x5x5 cm and biopsy, with 2 cm endometrial thickness and 2 cm intramural fibroid, normal appearing ovaries. Endometrial biopsy by Dr Marvel Plan 08-17-14 revealed FIGO grade 2 endometrial adenocarcinoma, with immunostains for p53 and p16 focally + supporting possible serous papillary component. She was seen in consultation by Dr Denman George on 08-31-14, with CT scheduled and recommendation for surgery followed possibly by adjuvant therapy. Genetics testing was also recommended, with concern for Lynch syndrome. CT AP 09-04-14 had LLL pulmonary scaring with lung bases otherwise clear, 1.6 cm hypodense lesion inferior posterior right hepatic lobe of unclear etiology, no adenopathy, no free fluid, ovaries normal, inhomogeneity of endometrium. Due to availability for surgery, she was referred to [facility], with surgery 09-15-14 by Dr Alycia Rossetti which was robotic assisted total laparoscopic hysterectomy, BSO, bilateral pelvic and para-arotic lymphednectomy. Pathology [facility] [phone]) from 09-15-14 found mixed adenocarcinoma ( 70% endometroid grade 3 and 30% serous grade 2) involving endometrium, with total size 6.5 x 2.8 x 0.8 cm and 2 mm myometrial invasion where wall 2.2 cm thick (9%). There was no involvement of serosa, lower uterine segment, cervix, adnexa or ovaries. There was no LVSI. Nodes: 3 right periaortic, no identified left periaortic (due to anatomy irregular on left at time of surgery), 5 right pelvic and 3 left pelvic nodes negative. ER + 90%. Pathologic stage IA. Operative note does not describe posterior liver region. Her case was discussed at [place] at [facility] on 10-04-14, with recommendation  for adjuvant carboplatin taxol, and vaginal brachytherapy. 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  Patient was DC home day after surgery. She saw Dr Alycia Rossetti on 10-02-14 with small left vaginal cuff separation which was no longer apparent by 10-19-14. First carbo taxol 11-01-14.  Review of systems as above, also: Slept until ~ 0300 today after steroid premeds. No nausea. Ibuprofen helpful for hip pain. No LE swelling. Has begun to cut back on cigarettes (as has son). Remainder of 10 point Review of Systems negative.  Objective:  Vital signs in last 24 hours:  BP 110/59 mmHg  Pulse 89  Temp(Src) 98.8 F (37.1 C) (Oral)  Resp 18  Ht _0  (1.6 m)  Wt 141 lb 14.4 oz (64.365 kg)  BMI 25.14 kg/m2  SpO2 99%  Alert, oriented and appropriate. Ambulatory without difficulty wearing 3 inch heels, able to get on and off exam table without difficulty.    HEENT:PERRL, sclerae not icteric. Oral mucosa moist without lesions, posterior pharynx clear.  Neck supple. No JVD.  Lymphatics:no cervical,supraclavicular, axillary or inguinal adenopathy Resp: clear to auscultation bilaterally and normal percussion bilaterally Cardio: regular rate and rhythm. No gallop. GI: soft, nontender, not distended, no mass or organomegaly. Normally active bowel sounds. Laparoscopic surgical incisions not remarkable. Musculoskeletal/ Extremities: without pitting edema, cords, tenderness Neuro: no peripheral neuropathy. Speech fluent and appropriate, no focal deficits. PSYCH: mood and affect appropriate. Skin without rash including hips, no ecchymosis or petechiae   Lab Results:  Results for orders placed or performed in visit on 11/01/14  CBC with Differential  Result Value Ref Range   WBC 5.9 3.9 - 10.3 10e3/uL   NEUT# 5.1 1.5 - 6.5 10e3/uL   HGB 11.4 (L) 11.6 - 15.9 g/dL   HCT 35.8 34.8 - 46.6 %   Platelets 315 145 - 400 10e3/uL   MCV 88.1 79.5 - 101.0 fL   MCH 28.1 25.1 - 34.0 pg   MCHC 31.8 31.5 - 36.0 g/dL   RBC 4.07 3.70 - 5.45  10e6/uL   RDW 14.9 (H) 11.2 - 14.5 %   lymph# 0.7 (L) 0.9 - 3.3 10e3/uL   MONO# 0.0 (L) 0.1 - 0.9 10e3/uL   Eosinophils Absolute 0.0 0.0 - 0.5 10e3/uL   Basophils Absolute 0.0 0.0 - 0.1 10e3/uL   NEUT% 86.0 (H) 38.4 - 76.8 %   LYMPH% 12.5 (L) 14.0 - 49.7 %   MONO% 0.6 0.0 - 14.0 %   EOS% 0.1 0.0 - 7.0 %   BASO% 0.8 0.0 - 2.0 %    CMET available after visit has glucose on steroids 187, CO2 20, creat 1.0, LFTs WNL  Studies/Results:  No results found.  Medications: I have reviewed the patient's current medications, which she brought to office as requested. Encouraged her to start claritin / lortadine in anticipation of taxol aches. OK to use ibuprofen next few days if needed. Verified that she has compazine  DISCUSSION: Reviewed how to get in touch with this office, possible taxol aches, claritin as noted. Identified antiemetic compazine in her medications. Appointment schedule to be given to them again today. If peripheral IV access not adequate for all of chemo, she would be interested in [facility].  Discussed their progress with smoking cessation.  Assessment/Plan: 1. 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 Post laparoscopic hysterectomy, BSO, nodes at [facility] 09-15-14. IHC testing on [facility] path pending. First carbo taxol q 3 week schedule today. I will see her back ~ 1 week and ~ 2 weeks after first treatment, with labs. Will need genetics counseling when that can be accomplished. 2.personal hx DCIS right breast, post right mastectomy with sentinel node and prophylactic left mastectomy 12-2010, with bilateral reconstructions 3. Mild anemia with history of heavy vaginal bleeding prior to diagnosis: add oral iron 4.ongoing tobacco, 10 pack year history. Encouraged both patient and son to stop smoking 5.  Colon polyps: followed with yearly colonoscopies. I will send my information to GI physician when she lets me know who that is 6.Colon cancer in father who died age 23, brain cancer in paternal uncle age 50s and "eye cancer" in paternal grandmother.  7. Per psychiatrist Dr Kennyth Lose at [facility] [phone]), diagnosis is Bipolar 2 disorder (hypomanic), history of PTSD and anxiety. 8. Previous C sections x 2 9.frequent HSV lip lesions by history: start with topical acyclovir, but may need to prophylax with oral if that is not sufficient   Patient and son have had all questions answered and are in agreement with plans. Nursing staff aware that this patient will need close attention. Chemo orders confirmed. No gCSF prior to repeat counts after treatment. Time spent 25 min including >50% counseling and coordination of care.    [name],LENNIS P, MD   11/01/2014, 9:23 AM

## 2014-11-01 NOTE — Patient Instructions (Signed)
[facility] Discharge Instructions for Patients Receiving Chemotherapy  Today you received the following chemotherapy agents: Taxol and Carboplatin.  To help prevent nausea and vomiting after your treatment, we encourage you to take your nausea medication: compazine 10 mg every 6 hours as needed.   If you develop nausea and vomiting that is not controlled by your nausea medication, call the clinic.   BELOW ARE SYMPTOMS THAT SHOULD BE REPORTED IMMEDIATELY:  *FEVER GREATER THAN 100.5 F  *CHILLS WITH OR WITHOUT FEVER  NAUSEA AND VOMITING THAT IS NOT CONTROLLED WITH YOUR NAUSEA MEDICATION  *UNUSUAL SHORTNESS OF BREATH  *UNUSUAL BRUISING OR BLEEDING  TENDERNESS IN MOUTH AND THROAT WITH OR WITHOUT PRESENCE OF ULCERS  *URINARY PROBLEMS  *BOWEL PROBLEMS  UNUSUAL RASH Items with * indicate a potential emergency and should be followed up as soon as possible.  Feel free to call the clinic you have any questions or concerns. The clinic phone number is [phone].  Paclitaxel injection What is this medicine? PACLITAXEL (PAK li TAX el) is a chemotherapy drug. It targets fast dividing cells, like cancer cells, and causes these cells to die. This medicine is used to treat ovarian cancer, breast cancer, and other cancers. This medicine may be used for other purposes; ask your health care provider or pharmacist if you have questions. COMMON BRAND NAME(S): Onxol, Taxol What should I tell my health care provider before I take this medicine? They need to know if you have any of these conditions: -blood disorders -irregular heartbeat -infection (especially a virus infection such as chickenpox, cold sores, or herpes) -liver disease -previous or ongoing radiation therapy -an unusual or allergic reaction to paclitaxel, alcohol, polyoxyethylated castor oil, other chemotherapy agents, other medicines, foods, dyes, or preservatives -pregnant or trying to get  pregnant -breast-feeding How should I use this medicine? This drug is given as an infusion into a vein. It is administered in a hospital or clinic by a specially trained health care professional. Talk to your pediatrician regarding the use of this medicine in children. Special care may be needed. Overdosage: If you think you have taken too much of this medicine contact a poison control center or emergency room at once. NOTE: This medicine is only for you. Do not share this medicine with others. What if I miss a dose? It is important not to miss your dose. Call your doctor or health care professional if you are unable to keep an appointment. What may interact with this medicine? Do not take this medicine with any of the following medications: -disulfiram -metronidazole This medicine may also interact with the following medications: -cyclosporine -diazepam -ketoconazole -medicines to increase blood counts like filgrastim, pegfilgrastim, sargramostim -other chemotherapy drugs like cisplatin, doxorubicin, epirubicin, etoposide, teniposide, vincristine -quinidine -testosterone -vaccines -verapamil Talk to your doctor or health care professional before taking any of these medicines: -acetaminophen -aspirin -ibuprofen -ketoprofen -naproxen This list may not describe all possible interactions. Give your health care provider a list of all the medicines, herbs, non-prescription drugs, or dietary supplements you use. Also tell them if you smoke, drink alcohol, or use illegal drugs. Some items may interact with your medicine. What should I watch for while using this medicine? Your condition will be monitored carefully while you are receiving this medicine. You will need important blood work done while you are taking this medicine. This drug may make you feel generally unwell. This is not uncommon, as chemotherapy can affect healthy cells as well as cancer cells. Report any  side effects. Continue  your course of treatment even though you feel ill unless your doctor tells you to stop. In some cases, you may be given additional medicines to help with side effects. Follow all directions for their use. Call your doctor or health care professional for advice if you get a fever, chills or sore throat, or other symptoms of a cold or flu. Do not treat yourself. This drug decreases your body's ability to fight infections. Try to avoid being around people who are sick. This medicine may increase your risk to bruise or bleed. Call your doctor or health care professional if you notice any unusual bleeding. Be careful brushing and flossing your teeth or using a toothpick because you may get an infection or bleed more easily. If you have any dental work done, tell your dentist you are receiving this medicine. Avoid taking products that contain aspirin, acetaminophen, ibuprofen, naproxen, or ketoprofen unless instructed by your doctor. These medicines may hide a fever. Do not become pregnant while taking this medicine. Women should inform their doctor if they wish to become pregnant or think they might be pregnant. There is a potential for serious side effects to an unborn child. Talk to your health care professional or pharmacist for more information. Do not breast-feed an infant while taking this medicine. Men are advised not to father a child while receiving this medicine. What side effects may I notice from receiving this medicine? Side effects that you should report to your doctor or health care professional as soon as possible: -allergic reactions like skin rash, itching or hives, swelling of the face, lips, or tongue -low blood counts - This drug may decrease the number of white blood cells, red blood cells and platelets. You may be at increased risk for infections and bleeding. -signs of infection - fever or chills, cough, sore throat, pain or difficulty passing urine -signs of decreased platelets or  bleeding - bruising, pinpoint red spots on the skin, black, tarry stools, nosebleeds -signs of decreased red blood cells - unusually weak or tired, fainting spells, lightheadedness -breathing problems -chest pain -high or low blood pressure -mouth sores -nausea and vomiting -pain, swelling, redness or irritation at the injection site -pain, tingling, numbness in the hands or feet -slow or irregular heartbeat -swelling of the ankle, feet, hands Side effects that usually do not require medical attention (report to your doctor or health care professional if they continue or are bothersome): -bone pain -complete hair loss including hair on your head, underarms, pubic hair, eyebrows, and eyelashes -changes in the color of fingernails -diarrhea -loosening of the fingernails -loss of appetite -muscle or joint pain -red flush to skin -sweating This list may not describe all possible side effects. Call your doctor for medical advice about side effects. You may report side effects to FDA at 1-800-FDA-1088. Where should I keep my medicine? This drug is given in a hospital or clinic and will not be stored at home. NOTE: This sheet is a summary. It may not cover all possible information. If you have questions about this medicine, talk to your doctor, pharmacist, or health care provider.  2015, Elsevier/Gold Standard. (2012-12-13 16:41:21)     Carboplatin injection What is this medicine? CARBOPLATIN (KAR boe pla tin) is a chemotherapy drug. It targets fast dividing cells, like cancer cells, and causes these cells to die. This medicine is used to treat ovarian cancer and many other cancers. This medicine may be used for other purposes; ask your health  care provider or pharmacist if you have questions. COMMON BRAND NAME(S): Paraplatin What should I tell my health care provider before I take this medicine? They need to know if you have any of these conditions: -blood disorders -hearing  problems -kidney disease -recent or ongoing radiation therapy -an unusual or allergic reaction to carboplatin, cisplatin, other chemotherapy, other medicines, foods, dyes, or preservatives -pregnant or trying to get pregnant -breast-feeding How should I use this medicine? This drug is usually given as an infusion into a vein. It is administered in a hospital or clinic by a specially trained health care professional. Talk to your pediatrician regarding the use of this medicine in children. Special care may be needed. Overdosage: If you think you have taken too much of this medicine contact a poison control center or emergency room at once. NOTE: This medicine is only for you. Do not share this medicine with others. What if I miss a dose? It is important not to miss a dose. Call your doctor or health care professional if you are unable to keep an appointment. What may interact with this medicine? -medicines for seizures -medicines to increase blood counts like filgrastim, pegfilgrastim, sargramostim -some antibiotics like amikacin, gentamicin, neomycin, streptomycin, tobramycin -vaccines Talk to your doctor or health care professional before taking any of these medicines: -acetaminophen -aspirin -ibuprofen -ketoprofen -naproxen This list may not describe all possible interactions. Give your health care provider a list of all the medicines, herbs, non-prescription drugs, or dietary supplements you use. Also tell them if you smoke, drink alcohol, or use illegal drugs. Some items may interact with your medicine. What should I watch for while using this medicine? Your condition will be monitored carefully while you are receiving this medicine. You will need important blood work done while you are taking this medicine. This drug may make you feel generally unwell. This is not uncommon, as chemotherapy can affect healthy cells as well as cancer cells. Report any side effects. Continue your course  of treatment even though you feel ill unless your doctor tells you to stop. In some cases, you may be given additional medicines to help with side effects. Follow all directions for their use. Call your doctor or health care professional for advice if you get a fever, chills or sore throat, or other symptoms of a cold or flu. Do not treat yourself. This drug decreases your body's ability to fight infections. Try to avoid being around people who are sick. This medicine may increase your risk to bruise or bleed. Call your doctor or health care professional if you notice any unusual bleeding. Be careful brushing and flossing your teeth or using a toothpick because you may get an infection or bleed more easily. If you have any dental work done, tell your dentist you are receiving this medicine. Avoid taking products that contain aspirin, acetaminophen, ibuprofen, naproxen, or ketoprofen unless instructed by your doctor. These medicines may hide a fever. Do not become pregnant while taking this medicine. Women should inform their doctor if they wish to become pregnant or think they might be pregnant. There is a potential for serious side effects to an unborn child. Talk to your health care professional or pharmacist for more information. Do not breast-feed an infant while taking this medicine. What side effects may I notice from receiving this medicine? Side effects that you should report to your doctor or health care professional as soon as possible: -allergic reactions like skin rash, itching or hives, swelling of the  face, lips, or tongue -signs of infection - fever or chills, cough, sore throat, pain or difficulty passing urine -signs of decreased platelets or bleeding - bruising, pinpoint red spots on the skin, black, tarry stools, nosebleeds -signs of decreased red blood cells - unusually weak or tired, fainting spells, lightheadedness -breathing problems -changes in hearing -changes in  vision -chest pain -high blood pressure -low blood counts - This drug may decrease the number of white blood cells, red blood cells and platelets. You may be at increased risk for infections and bleeding. -nausea and vomiting -pain, swelling, redness or irritation at the injection site -pain, tingling, numbness in the hands or feet -problems with balance, talking, walking -trouble passing urine or change in the amount of urine Side effects that usually do not require medical attention (report to your doctor or health care professional if they continue or are bothersome): -hair loss -loss of appetite -metallic taste in the mouth or changes in taste This list may not describe all possible side effects. Call your doctor for medical advice about side effects. You may report side effects to FDA at 1-800-FDA-1088. Where should I keep my medicine? This drug is given in a hospital or clinic and will not be stored at home. NOTE: This sheet is a summary. It may not cover all possible information. If you have questions about this medicine, talk to your doctor, pharmacist, or health care provider.  2015, Elsevier/Gold Standard. (2008-01-25 14:38:05)

## 2014-11-02 ENCOUNTER — Telehealth: Payer: Self-pay

## 2014-11-02 NOTE — Telephone Encounter (Signed)
Laurie Benson stated that she is doing very well no nausea or vomiting.   No aches yet from taxol.  She is going to pick up Claritin now to help minimize the aches.  Suggested warm bath soks and or heatin pad to help relieve the aches if they occur. Reminded her to drink at least (5) 8oz of fluid a day.  Water is great but can drink any caffeine free fluid.   She is eating well thus far. No problems with bowel movements. Laurie Benson appreciated the phone call and has the office number to call if she has any issues or concerns.

## 2014-11-05 ENCOUNTER — Other Ambulatory Visit: Payer: Self-pay | Admitting: Oncology

## 2014-11-06 ENCOUNTER — Telehealth: Payer: Self-pay

## 2014-11-06 NOTE — Telephone Encounter (Signed)
Ms. Tramontana states that she is doing well since treatment.  She had one niight with aches in her neck and legs.  She did not use pain medication.  She got herself a heating pad and took a warm bath.   She states that the IV sit on the posterior side of her left arm has been sor.  She is afebrile.  The site is slightly swollen and not red.  Suggested that she alpply warm soaks qid daily until Dr. Marko Plume sees her 11-09-14.  She can take 1 Advil daily. She know to call if side becomes red or she is running a fever of 101.5 or greater.

## 2014-11-06 NOTE — Telephone Encounter (Signed)
-----   Message from Gordy Levan, MD sent at 11/05/2014  2:51 PM EST ----- I may have already asked this, but could RN please check on her ~ 1-4. First taxol Norma Fredrickson was 12-30 and I see her with lab 1-7  thanks

## 2014-11-09 ENCOUNTER — Encounter: Payer: Self-pay | Admitting: Oncology

## 2014-11-09 ENCOUNTER — Ambulatory Visit (HOSPITAL_BASED_OUTPATIENT_CLINIC_OR_DEPARTMENT_OTHER): Payer: Medicare Other | Admitting: Oncology

## 2014-11-09 ENCOUNTER — Other Ambulatory Visit (HOSPITAL_BASED_OUTPATIENT_CLINIC_OR_DEPARTMENT_OTHER): Payer: Medicare Other

## 2014-11-09 ENCOUNTER — Telehealth: Payer: Self-pay | Admitting: Oncology

## 2014-11-09 VITALS — BP 137/75 | HR 81 | Temp 98.9°F | Resp 18 | Ht 63.0 in | Wt 142.1 lb

## 2014-11-09 DIAGNOSIS — D0511 Intraductal carcinoma in situ of right breast: Secondary | ICD-10-CM

## 2014-11-09 DIAGNOSIS — D649 Anemia, unspecified: Secondary | ICD-10-CM

## 2014-11-09 DIAGNOSIS — C541 Malignant neoplasm of endometrium: Secondary | ICD-10-CM

## 2014-11-09 DIAGNOSIS — Z720 Tobacco use: Secondary | ICD-10-CM

## 2014-11-09 DIAGNOSIS — K635 Polyp of colon: Secondary | ICD-10-CM

## 2014-11-09 DIAGNOSIS — Z8659 Personal history of other mental and behavioral disorders: Secondary | ICD-10-CM

## 2014-11-09 DIAGNOSIS — F419 Anxiety disorder, unspecified: Secondary | ICD-10-CM

## 2014-11-09 DIAGNOSIS — Z800 Family history of malignant neoplasm of digestive organs: Secondary | ICD-10-CM

## 2014-11-09 DIAGNOSIS — F4310 Post-traumatic stress disorder, unspecified: Secondary | ICD-10-CM

## 2014-11-09 DIAGNOSIS — F3181 Bipolar II disorder: Secondary | ICD-10-CM

## 2014-11-09 LAB — COMPREHENSIVE METABOLIC PANEL (CC13)
ALBUMIN: 3.7 g/dL (ref 3.5–5.0)
ALT: 26 U/L (ref 0–55)
ANION GAP: 11 meq/L (ref 3–11)
AST: 31 U/L (ref 5–34)
Alkaline Phosphatase: 50 U/L (ref 40–150)
BUN: 9.5 mg/dL (ref 7.0–26.0)
CALCIUM: 9.1 mg/dL (ref 8.4–10.4)
CHLORIDE: 109 meq/L (ref 98–109)
CO2: 24 mEq/L (ref 22–29)
Creatinine: 0.9 mg/dL (ref 0.6–1.1)
EGFR: 71 mL/min/{1.73_m2} — AB (ref >90)
GLUCOSE: 96 mg/dL (ref 70–140)
Potassium: 3.6 mEq/L (ref 3.5–5.1)
SODIUM: 143 meq/L (ref 136–145)
TOTAL PROTEIN: 6.7 g/dL (ref 6.4–8.3)
Total Bilirubin: 0.47 mg/dL (ref 0.20–1.20)

## 2014-11-09 LAB — CBC WITH DIFFERENTIAL/PLATELET
BASO%: 1.4 % (ref 0.0–2.0)
BASOS ABS: 0.1 10*3/uL (ref 0.0–0.1)
EOS%: 3 % (ref 0.0–7.0)
Eosinophils Absolute: 0.1 10*3/uL (ref 0.0–0.5)
HCT: 31.6 % — ABNORMAL LOW (ref 34.8–46.6)
HGB: 10.1 g/dL — ABNORMAL LOW (ref 11.6–15.9)
LYMPH%: 41.9 % (ref 14.0–49.7)
MCH: 27.9 pg (ref 25.1–34.0)
MCHC: 31.9 g/dL (ref 31.5–36.0)
MCV: 87.5 fL (ref 79.5–101.0)
MONO#: 0.3 10*3/uL (ref 0.1–0.9)
MONO%: 5.5 % (ref 0.0–14.0)
NEUT%: 48.2 % (ref 38.4–76.8)
NEUTROS ABS: 2.3 10*3/uL (ref 1.5–6.5)
Platelets: 247 10*3/uL (ref 145–400)
RBC: 3.61 10*6/uL — ABNORMAL LOW (ref 3.70–5.45)
RDW: 15.2 % — ABNORMAL HIGH (ref 11.2–14.5)
WBC: 4.8 10*3/uL (ref 3.9–10.3)
lymph#: 2 10*3/uL (ref 0.9–3.3)

## 2014-11-09 NOTE — Telephone Encounter (Signed)
, °

## 2014-11-09 NOTE — Progress Notes (Signed)
OFFICE PROGRESS NOTE   11/09/2014   Physicians:Paola Gehrig/ [name], Gerald Stabs RIchardson (gyn [place]), Erroll Luna, Louretta Parma [place], PCP [facility] in [place] (psychiatry, [facility] in [place]) Patient has not brought names of other MDs.  Has not been referred to radiation oncology as yet  INTERVAL HISTORY:  Patient is seen, together with son, having begun adjuvant chemotherapy for IA serous/endometroid carcinoma of uterus with cycle 1 carboplatin taxol on 11-01-14. She has tolerated the chemotherapy well thus far, including no nausea.  She has not needed any prn antiemetics. Bowels have moved without difficulty. She had 24 hours of taxol aches mostly in neck and spine, improved with heating pad. Face was flushed transiently from steroids; taste is not correct.  Peripheral IV access was somewhat difficult, obtained in left forearm for the first chemo. (Note she had right sentinel node evaluation with right mastectomy for DCIS, and left prophylactic mastectomy without left axillary node evaluation.)    She saw Dr Lucy Antigua since chemo given, seroquel increased to 2 at hs which she began just yesterday.    No PAC Needs genetics counseling, not discussed today Flu vaccine done Has not stopped smoking tho she is interested in this  ONCOLOGIC HISTORY   Breast cancer   11/20/2011 Initial Diagnosis Breast cancer    Endometrial cancer   08/31/2014 Initial Diagnosis Endometrial cancer   09/15/2014 Surgery TRH/BSO and staging. IA UPSC    Chemotherapy   Patient had very heavy vaginal bleeding perhaps a year ago, seen by gyn in [place] and recalls that PAP was ok. She continued to have heavy menses and spotting between periods, seen back by Dr Ellouise Newer with ultrasound 07-27-14 which showed uterus 8x5x5 cm and biopsy, with 2 cm endometrial thickness and 2 cm intramural fibroid, normal appearing ovaries. Endometrial biopsy by Dr Marvel Plan  08-17-14 revealed FIGO grade 2 endometrial adenocarcinoma, with immunostains for p53 and p16 focally + supporting possible serous papillary component. She was seen in consultation by Dr Denman George on 08-31-14, with CT scheduled and recommendation for surgery followed possibly by adjuvant therapy. Genetics testing was also recommended, with concern for Lynch syndrome. CT AP 09-04-14 had LLL pulmonary scaring with lung bases otherwise clear, 1.6 cm hypodense lesion inferior posterior right hepatic lobe of unclear etiology, no adenopathy, no free fluid, ovaries normal, inhomogeneity of endometrium. Due to availability for surgery, she was referred to [facility], with surgery 09-15-14 by Dr Alycia Rossetti which was robotic assisted total laparoscopic hysterectomy, BSO, bilateral pelvic and para-arotic lymphednectomy. Pathology [facility] [phone]) from 09-15-14 found mixed adenocarcinoma ( 70% endometroid grade 3 and 30% serous grade 2) involving endometrium, with total size 6.5 x 2.8 x 0.8 cm and 2 mm myometrial invasion where wall 2.2 cm thick (9%). There was no involvement of serosa, lower uterine segment, cervix, adnexa or ovaries. There was no LVSI. Nodes: 3 right periaortic, no identified left periaortic (due to anatomy irregular on left at time of surgery), 5 right pelvic and 3 left pelvic nodes negative. ER + 90%. Pathologic stage IA. Operative note does not describe posterior liver region. Her case was discussed at [place] at [facility] on 10-04-14, with recommendation for adjuvant carboplatin taxol, and vaginal brachytherapy. 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  Patient was DC home day after surgery. She saw Dr Alycia Rossetti on 10-02-14 with small left vaginal cuff separation which was no longer  apparent by 10-19-14. First carbo taxol 11-01-14.  Review of systems as above, also: No  fever or symptoms of infection. No peripheral neuropathy. No bleeding. No abdominal or pelvic pain.  Remainder of 10 point Review of Systems negative.  Objective:  Vital signs in last 24 hours:  BP 137/75 mmHg  Pulse 81  Temp(Src) 98.9 F (37.2 C) (Oral)  Resp 18  Ht 5' 3"  (1.6 m)  Wt 142 lb 1.6 oz (64.456 kg)  BMI 25.18 kg/m2 Weight stable Alert, oriented and appropriate. Ambulatory without difficulty. Neatly groomed, pleasant and cooperative, looks comfortable. Smells of tobacco No alopecia  HEENT:PERRL, sclerae not icteric. Oral mucosa moist without lesions, posterior pharynx clear. No herpetic lesions on lips Neck supple. No JVD.  Lymphatics:no cervical,supraclavicular adenopathy Resp: clear to auscultation bilaterally and normal percussion bilaterally Cardio: regular rate and rhythm. No gallop. GI: soft, nontender, not distended, no mass or organomegaly. Normally active bowel sounds. Surgical incision not remarkable. Musculoskeletal/ Extremities: without pitting edema, cords, tenderness Neuro: no peripheral neuropathy. Otherwise nonfocal. PSYCH appropriate mood and affect. Skin without rash, ecchymosis, petechiae Breast exam not repeated, bilateral reconstructions   Lab Results:  Results for orders placed or performed in visit on 11/09/14  CBC with Differential  Result Value Ref Range   WBC 4.8 3.9 - 10.3 10e3/uL   NEUT# 2.3 1.5 - 6.5 10e3/uL   HGB 10.1 (L) 11.6 - 15.9 g/dL   HCT 31.6 (L) 34.8 - 46.6 %   Platelets 247 145 - 400 10e3/uL   MCV 87.5 79.5 - 101.0 fL   MCH 27.9 25.1 - 34.0 pg   MCHC 31.9 31.5 - 36.0 g/dL   RBC 3.61 (L) 3.70 - 5.45 10e6/uL   RDW 15.2 (H) 11.2 - 14.5 %   lymph# 2.0 0.9 - 3.3 10e3/uL   MONO# 0.3 0.1 - 0.9 10e3/uL   Eosinophils Absolute 0.1 0.0 - 0.5 10e3/uL   Basophils Absolute 0.1 0.0 - 0.1 10e3/uL   NEUT% 48.2 38.4 - 76.8 %   LYMPH% 41.9 14.0 - 49.7 %   MONO% 5.5 0.0 - 14.0 %   EOS% 3.0 0.0 - 7.0 %   BASO% 1.4 0.0 - 2.0 %   Comprehensive metabolic panel (Cmet) - [facility]  Result Value Ref Range   Sodium 143 136 - 145 mEq/L   Potassium 3.6 3.5 - 5.1 mEq/L   Chloride 109 98 - 109 mEq/L   CO2 24 22 - 29 mEq/L   Glucose 96 70 - 140 mg/dl   BUN 9.5 7.0 - 26.0 mg/dL   Creatinine 0.9 0.6 - 1.1 mg/dL   Total Bilirubin 0.47 0.20 - 1.20 mg/dL   Alkaline Phosphatase 50 40 - 150 U/L   AST 31 5 - 34 U/L   ALT 26 0 - 55 U/L   Total Protein 6.7 6.4 - 8.3 g/dL   Albumin 3.7 3.5 - 5.0 g/dL   Calcium 9.1 8.4 - 10.4 mg/dL   Anion Gap 11 3 - 11 mEq/L   EGFR 71 (L) >90 ml/min/1.73 m2     Studies/Results:  No results found.  Medications: I have reviewed the patient's current medications. Has not begun oral iron yet, to begin today. Note EMEND and compazine as antiemetics due to multiple psychiatric medications.  DISCUSSION: blood counts likely will decrease some further in next week, so she will call prior to scheduled visit on 1-14 if extremely fatigued or any bleeding or symptoms of infection, but all counts ok now.  Assessment/Plan: 1. 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 Post laparoscopic hysterectomy, BSO, nodes at [facility] 09-15-14. IHC testing on [facility] path pending. First carbo taxol q 3 week schedule 11-01-14, tolerated well thus far. I will see her back also next week with labs. Due cycle 2 on 11-22-14. Per Dr Elenora Gamma [facility] note 10-02-14, she needs vaginal brachytherapy; she will need referral to radiation oncology. Will need genetics counseling when that can be accomplished. 2.personal hx DCIS right breast, post right mastectomy with sentinel node and prophylactic left mastectomy 12-2010, with bilateral reconstructions 3. Mild anemia with history of heavy vaginal bleeding prior to diagnosis: add oral iron 4.ongoing tobacco, 10  pack year history. Encouraged both patient and son to stop smoking 5. Colon polyps: followed with yearly colonoscopies. I will send my information to GI physician when she lets me know who that is 6.Colon cancer in father who died age 38, brain cancer in paternal uncle age 28s and "eye cancer" in paternal grandmother.  7. Per psychiatrist Dr Kennyth Lose at [facility] [phone]), diagnosis is Bipolar 2 disorder (hypomanic), history of PTSD and anxiety. 8. Previous C sections x 2 9.frequent HSV lip lesions by history: start with topical acyclovir, but may need to prophylax with oral if that is not sufficient    All questions answered. Cc this note to Dr Lucy Antigua. TIme spent 25 min including >50% counseling and coordination of care.   Laurie Puebla P, MD   11/09/2014, 4:32 PM

## 2014-11-10 ENCOUNTER — Telehealth: Payer: Self-pay | Admitting: *Deleted

## 2014-11-10 NOTE — Telephone Encounter (Signed)
Per staff message and POF I have scheduled appts. Advised scheduler of appts. JMW  

## 2014-11-11 ENCOUNTER — Other Ambulatory Visit: Payer: Self-pay | Admitting: Oncology

## 2014-11-11 DIAGNOSIS — C541 Malignant neoplasm of endometrium: Secondary | ICD-10-CM

## 2014-11-13 ENCOUNTER — Telehealth: Payer: Self-pay | Admitting: Oncology

## 2014-11-13 NOTE — Telephone Encounter (Signed)
, °

## 2014-11-14 ENCOUNTER — Encounter: Payer: Self-pay | Admitting: *Deleted

## 2014-11-14 NOTE — Progress Notes (Signed)
[facility] Psychosocial Distress Screening Clinical Social Work  Clinical Social Work was referred by distress screening protocol.  The patient scored a 5 on the Psychosocial Distress Thermometer which indicates moderate distress. Clinical Social Worker attempted to contact patient by phone to assess for distress and other psychosocial needs.  ONCBCN DISTRESS SCREENING 11/01/2014  Screening Type Initial Screening  Distress experienced in past week (1-10) 5  Practical problem type (No Data)  Family Problem type Children  Emotional problem type Depression;Nervousness/Anxiety;Adjusting to illness;Isolation/feeling alone;Feeling hopeless;Boredom;Adjusting to appearance changes  Spiritual/Religous concerns type Relating to God  Information Concerns Type (No Data)  Physical Problem type Pain;Sleep/insomnia;Breathing;Loss of appetitie;Constipation/diarrhea  Physician notified of physical symptoms Yes  Referral to clinical psychology No  Referral to clinical social work Yes    Clinical Social Worker follow up needed: Yes.    If yes, follow up plan:  CSW left voicemail for patient to return call.  Polo Riley, MSW, LCSW, OSW-C Clinical Social Worker [facility] [phone]

## 2014-11-15 ENCOUNTER — Other Ambulatory Visit: Payer: Self-pay | Admitting: Oncology

## 2014-11-16 ENCOUNTER — Telehealth: Payer: Self-pay

## 2014-11-16 ENCOUNTER — Encounter: Payer: Self-pay | Admitting: Oncology

## 2014-11-16 ENCOUNTER — Other Ambulatory Visit: Payer: Medicare Other

## 2014-11-16 ENCOUNTER — Ambulatory Visit: Payer: Medicare Other | Admitting: Oncology

## 2014-11-16 NOTE — Telephone Encounter (Signed)
LM for Laurie Benson stating that she missed her appointment today with Dr. Marko Plume.  Dr. Marko Plume can see her tomorrow at 1200 lab then visit at 1230 or lab at 130 pm and 2 pm visit.  Requested that she call the office tomorrow 11-17-14 am and let Dr. Mariana Kaufman nurse know if she can make either of these appointments.

## 2014-11-16 NOTE — Progress Notes (Signed)
No authorization needed for granix or neulasta. (M'care/M'caid)

## 2014-11-16 NOTE — Telephone Encounter (Signed)
Reached Laurie Benson this evening and she will see Dr. Marko Plume tomorrow at 2 pm with Lab at 1330.

## 2014-11-17 ENCOUNTER — Ambulatory Visit: Payer: Medicare Other | Admitting: Oncology

## 2014-11-17 ENCOUNTER — Ambulatory Visit: Payer: Medicare Other

## 2014-11-17 ENCOUNTER — Telehealth: Payer: Self-pay | Admitting: Oncology

## 2014-11-17 NOTE — Telephone Encounter (Signed)
m °

## 2014-11-22 ENCOUNTER — Telehealth: Payer: Self-pay | Admitting: *Deleted

## 2014-11-22 ENCOUNTER — Other Ambulatory Visit: Payer: Medicare Other

## 2014-11-22 ENCOUNTER — Other Ambulatory Visit: Payer: Self-pay | Admitting: *Deleted

## 2014-11-22 ENCOUNTER — Telehealth: Payer: Self-pay

## 2014-11-22 DIAGNOSIS — C541 Malignant neoplasm of endometrium: Secondary | ICD-10-CM

## 2014-11-22 MED ORDER — DEXAMETHASONE 4 MG PO TABS: ORAL_TABLET | ORAL | Status: DC

## 2014-11-22 NOTE — Telephone Encounter (Signed)
Gordy Levan, MD  Thu Simon Rhein, RN Cc: Baruch Merl, RN           She FTKA'd 2 appointments with me last week.  Please let her know that our office policy is that we may not continue to see her if she FTKA total of 3 appointments.   If she if having ANY PROBLEMS, then I need to see her at 0800 on 1-21. If problems but they do not seem to contraindicate chemo, ok to take steroid premeds as below and still let me see her.  If no problems at all, she can take the premed decadron five tablets with food 12 hrs and 6 hrs prior to chemo. Go over times with her.   We will check labs prior to chemo on 1-21 as scheduled, CBC and CMET.   So please send in decadron script for #10 with instructions.  Be sure she knows of apt with MD on 1-28.   Please document all of above in EMR  Thanks!  Lennis

## 2014-11-22 NOTE — Telephone Encounter (Signed)
-----   Message from Gordy Levan, MD sent at 11/22/2014  9:58 AM EST ----- Regarding: RE: Dexamethasone premed for Taxol She FTKA'd 2 appointments with me last week. Please let her know that our office policy is that we may not continue to see her if she FTKA total of 3 appointments.  If she if having ANY PROBLEMS, then I need to see her at 0800 on 1-21. If problems but they do not seem to contraindicate chemo, ok to take steroid premeds as below and still let me see her. If no problems at all, she can take the premed decadron five tablets with food 12 hrs and 6 hrs prior to chemo. Go over times with her.  We will check labs prior to chemo on 1-21 as scheduled, CBC and CMET.  So please send in decadron script for #10 with instructions. Be sure she knows of apt with MD on 1-28.  Please document all of above in EMR Thanks! Lennis ----- Message -----    From: Jarvis Morgan, RN    Sent: 11/22/2014   9:23 AM      To: Gordy Levan, MD Subject: Dexamethasone premed for Taxol                 Hi Dr. Marko Plume, Pt called stating she has no steroid pills left to take 12 hours and 6 hours prior to chemo on 11/23/14.   Do you want me to call in 10 tabs of Dex to pt's pharmacy? Please advise. Thanks, Thu.

## 2014-11-22 NOTE — Telephone Encounter (Signed)
Spoke with Ms. Laurie Benson at length about the importance of keeping the follow up appointments with Dr. Marko Plume as the chemotherapy can cause very serious side effects. Reviewed the office policy of 3 Missed appointments with Ms. Laurie Benson.  She apologized for forgetting the appointments.  She stated that she cannot remember as that is why she is disabled.  Suggested that she see if her son or daughter can help her with keeping appointments. Gave her appointment dates and times for Genetic Counsling on 11-29-14 at 1pm and 11-30-14 at 2 pm with Dr. Marko Plume. Told her when she checked out with scheduling to request a calendar form of her appointments. Ms. Laurie Benson is doing very well from her last treatment.  Denies and difficulties with n/v, bowels, or appetite. Told her decadron was sent in to her pharmacy.  Reviewed the times of 930 pm tonight and 0300 tomorrow 11-23-14 to take steroids.  Stressed to take them with some food.  Ms. Laurie Benson wrote down instructions and correctly  repeated them back to this nurse. Ms. Laurie Benson appreciated  reviewing the information and appointment schedule.

## 2014-11-23 ENCOUNTER — Telehealth: Payer: Self-pay

## 2014-11-23 ENCOUNTER — Other Ambulatory Visit: Payer: Self-pay | Admitting: Oncology

## 2014-11-23 ENCOUNTER — Ambulatory Visit (HOSPITAL_BASED_OUTPATIENT_CLINIC_OR_DEPARTMENT_OTHER): Payer: Medicare Other

## 2014-11-23 ENCOUNTER — Other Ambulatory Visit (HOSPITAL_BASED_OUTPATIENT_CLINIC_OR_DEPARTMENT_OTHER): Payer: Medicare Other

## 2014-11-23 DIAGNOSIS — C541 Malignant neoplasm of endometrium: Secondary | ICD-10-CM | POA: Diagnosis not present

## 2014-11-23 DIAGNOSIS — D649 Anemia, unspecified: Secondary | ICD-10-CM

## 2014-11-23 DIAGNOSIS — Z5111 Encounter for antineoplastic chemotherapy: Secondary | ICD-10-CM | POA: Diagnosis present

## 2014-11-23 LAB — CBC WITH DIFFERENTIAL/PLATELET
BASO%: 0.2 % (ref 0.0–2.0)
Basophils Absolute: 0 10*3/uL (ref 0.0–0.1)
EOS%: 0 % (ref 0.0–7.0)
Eosinophils Absolute: 0 10*3/uL (ref 0.0–0.5)
HEMATOCRIT: 35.5 % (ref 34.8–46.6)
HGB: 11.5 g/dL — ABNORMAL LOW (ref 11.6–15.9)
LYMPH%: 12.8 % — ABNORMAL LOW (ref 14.0–49.7)
MCH: 28.7 pg (ref 25.1–34.0)
MCHC: 32.4 g/dL (ref 31.5–36.0)
MCV: 88.5 fL (ref 79.5–101.0)
MONO#: 0 10*3/uL — ABNORMAL LOW (ref 0.1–0.9)
MONO%: 0.3 % (ref 0.0–14.0)
NEUT#: 5.4 10*3/uL (ref 1.5–6.5)
NEUT%: 86.7 % — AB (ref 38.4–76.8)
PLATELETS: 261 10*3/uL (ref 145–400)
RBC: 4.01 10*6/uL (ref 3.70–5.45)
RDW: 14.6 % — ABNORMAL HIGH (ref 11.2–14.5)
WBC: 6.3 10*3/uL (ref 3.9–10.3)
lymph#: 0.8 10*3/uL — ABNORMAL LOW (ref 0.9–3.3)

## 2014-11-23 LAB — COMPREHENSIVE METABOLIC PANEL (CC13)
ALT: 13 U/L (ref 0–55)
ANION GAP: 9 meq/L (ref 3–11)
AST: 17 U/L (ref 5–34)
Albumin: 3.7 g/dL (ref 3.5–5.0)
Alkaline Phosphatase: 53 U/L (ref 40–150)
BUN: 13.4 mg/dL (ref 7.0–26.0)
CO2: 22 mEq/L (ref 22–29)
Calcium: 9 mg/dL (ref 8.4–10.4)
Chloride: 112 mEq/L — ABNORMAL HIGH (ref 98–109)
Creatinine: 1 mg/dL (ref 0.6–1.1)
EGFR: 66 mL/min/{1.73_m2} — ABNORMAL LOW (ref >90)
Glucose: 154 mg/dl — ABNORMAL HIGH (ref 70–140)
Potassium: 3.6 mEq/L (ref 3.5–5.1)
Sodium: 144 mEq/L (ref 136–145)
Total Protein: 7 g/dL (ref 6.4–8.3)

## 2014-11-23 MED ORDER — SODIUM CHLORIDE 0.9 % IV SOLN
150.0000 mg | Freq: Once | INTRAVENOUS | Status: AC
  Administered 2014-11-23: 150 mg via INTRAVENOUS
  Filled 2014-11-23: qty 5

## 2014-11-23 MED ORDER — SODIUM CHLORIDE 0.9 % IV SOLN
453.0000 mg | Freq: Once | INTRAVENOUS | Status: AC
  Administered 2014-11-23: 450 mg via INTRAVENOUS
  Filled 2014-11-23: qty 45

## 2014-11-23 MED ORDER — PACLITAXEL CHEMO INJECTION 300 MG/50ML
135.0000 mg/m2 | Freq: Once | INTRAVENOUS | Status: AC
  Administered 2014-11-23: 222 mg via INTRAVENOUS
  Filled 2014-11-23: qty 37

## 2014-11-23 MED ORDER — DEXAMETHASONE SODIUM PHOSPHATE 20 MG/5ML IJ SOLN
20.0000 mg | Freq: Once | INTRAMUSCULAR | Status: DC
  Administered 2014-11-23: 12 mg via INTRAVENOUS

## 2014-11-23 MED ORDER — DEXAMETHASONE SODIUM PHOSPHATE 20 MG/5ML IJ SOLN
12.0000 mg | Freq: Once | INTRAMUSCULAR | Status: AC
  Administered 2014-11-23: 12 mg via INTRAVENOUS

## 2014-11-23 MED ORDER — FAMOTIDINE IN NACL 20-0.9 MG/50ML-% IV SOLN
20.0000 mg | Freq: Once | INTRAVENOUS | Status: AC
  Administered 2014-11-23: 20 mg via INTRAVENOUS

## 2014-11-23 MED ORDER — DEXAMETHASONE SODIUM PHOSPHATE 20 MG/5ML IJ SOLN
INTRAMUSCULAR | Status: AC
  Filled 2014-11-23: qty 5

## 2014-11-23 MED ORDER — SODIUM CHLORIDE 0.9 % IV SOLN
Freq: Once | INTRAVENOUS | Status: AC
  Administered 2014-11-23: 11:00:00 via INTRAVENOUS

## 2014-11-23 MED ORDER — DIPHENHYDRAMINE HCL 50 MG/ML IJ SOLN
INTRAMUSCULAR | Status: AC
  Filled 2014-11-23: qty 1

## 2014-11-23 MED ORDER — DIPHENHYDRAMINE HCL 50 MG/ML IJ SOLN
50.0000 mg | Freq: Once | INTRAMUSCULAR | Status: AC
  Administered 2014-11-23: 50 mg via INTRAVENOUS

## 2014-11-23 MED ORDER — FAMOTIDINE IN NACL 20-0.9 MG/50ML-% IV SOLN
INTRAVENOUS | Status: AC
  Filled 2014-11-23: qty 50

## 2014-11-23 MED ORDER — PROCHLORPERAZINE EDISYLATE 5 MG/ML IJ SOLN
10.0000 mg | Freq: Four times a day (QID) | INTRAMUSCULAR | Status: DC | PRN
  Administered 2014-11-23: 10 mg via INTRAVENOUS

## 2014-11-23 MED ORDER — PROCHLORPERAZINE EDISYLATE 5 MG/ML IJ SOLN
INTRAMUSCULAR | Status: AC
  Filled 2014-11-23: qty 2

## 2014-11-23 NOTE — Telephone Encounter (Signed)
Patient requesting refill  On percocet prior to DC from treatment.  Pt. Some aches from the taxol x 1 day last time.  Heating pad was helpful.  She is to see Dr. Marko Plume on 11-30-14. Told her Dr. Marko Plume said that she felt that she has a lot of different medications in her system right now and it would be better not to add percocet.  Ms. Laurie Benson verbalized understanding. Gave patient a calendar form of appointments. Ms. Laurie Benson shared that she is illiterate.  Showed female friend appointments  as she can read to help Ms. Laurie Benson to make appointments as she FTKA x 2.

## 2014-11-26 ENCOUNTER — Other Ambulatory Visit: Payer: Self-pay | Admitting: Oncology

## 2014-11-26 DIAGNOSIS — C541 Malignant neoplasm of endometrium: Secondary | ICD-10-CM

## 2014-11-29 ENCOUNTER — Ambulatory Visit (HOSPITAL_BASED_OUTPATIENT_CLINIC_OR_DEPARTMENT_OTHER): Payer: Medicare Other | Admitting: Genetic Counselor

## 2014-11-29 ENCOUNTER — Encounter: Payer: Self-pay | Admitting: Genetic Counselor

## 2014-11-29 ENCOUNTER — Other Ambulatory Visit (HOSPITAL_BASED_OUTPATIENT_CLINIC_OR_DEPARTMENT_OTHER): Payer: Medicare Other

## 2014-11-29 DIAGNOSIS — Z853 Personal history of malignant neoplasm of breast: Secondary | ICD-10-CM

## 2014-11-29 DIAGNOSIS — C541 Malignant neoplasm of endometrium: Secondary | ICD-10-CM

## 2014-11-29 DIAGNOSIS — Z315 Encounter for genetic counseling: Secondary | ICD-10-CM

## 2014-11-29 DIAGNOSIS — K635 Polyp of colon: Secondary | ICD-10-CM

## 2014-11-29 DIAGNOSIS — Z800 Family history of malignant neoplasm of digestive organs: Secondary | ICD-10-CM

## 2014-11-29 DIAGNOSIS — Z808 Family history of malignant neoplasm of other organs or systems: Secondary | ICD-10-CM | POA: Insufficient documentation

## 2014-11-29 LAB — COMPREHENSIVE METABOLIC PANEL (CC13)
ALT: 12 U/L (ref 0–55)
ANION GAP: 13 meq/L — AB (ref 3–11)
AST: 14 U/L (ref 5–34)
Albumin: 4.1 g/dL (ref 3.5–5.0)
Alkaline Phosphatase: 54 U/L (ref 40–150)
BUN: 14.4 mg/dL (ref 7.0–26.0)
CALCIUM: 9.5 mg/dL (ref 8.4–10.4)
CO2: 20 mEq/L — ABNORMAL LOW (ref 22–29)
Chloride: 107 mEq/L (ref 98–109)
Creatinine: 0.8 mg/dL (ref 0.6–1.1)
EGFR: 84 mL/min/{1.73_m2} — AB (ref >90)
GLUCOSE: 111 mg/dL (ref 70–140)
POTASSIUM: 3.8 meq/L (ref 3.5–5.1)
Sodium: 141 mEq/L (ref 136–145)
Total Bilirubin: 0.82 mg/dL (ref 0.20–1.20)
Total Protein: 7.3 g/dL (ref 6.4–8.3)

## 2014-11-29 LAB — CBC WITH DIFFERENTIAL/PLATELET
BASO%: 0.4 % (ref 0.0–2.0)
BASOS ABS: 0 10*3/uL (ref 0.0–0.1)
EOS%: 0.9 % (ref 0.0–7.0)
Eosinophils Absolute: 0.1 10*3/uL (ref 0.0–0.5)
HCT: 36.8 % (ref 34.8–46.6)
HEMOGLOBIN: 12.1 g/dL (ref 11.6–15.9)
LYMPH%: 42.5 % (ref 14.0–49.7)
MCH: 28.3 pg (ref 25.1–34.0)
MCHC: 32.9 g/dL (ref 31.5–36.0)
MCV: 86.2 fL (ref 79.5–101.0)
MONO#: 0.1 10*3/uL (ref 0.1–0.9)
MONO%: 1.8 % (ref 0.0–14.0)
NEUT#: 3 10*3/uL (ref 1.5–6.5)
NEUT%: 54.4 % (ref 38.4–76.8)
Platelets: 216 10*3/uL (ref 145–400)
RBC: 4.27 10*6/uL (ref 3.70–5.45)
RDW: 14.7 % — ABNORMAL HIGH (ref 11.2–14.5)
WBC: 5.5 10*3/uL (ref 3.9–10.3)
lymph#: 2.3 10*3/uL (ref 0.9–3.3)

## 2014-11-29 NOTE — Progress Notes (Signed)
REFERRING PROVIDER: Imagene Gurney A. Alycia Rossetti, [place] [address] [place], [place] [address]   Evlyn Clines, MD  PRIMARY PROVIDER:  Pcp Not In System  PRIMARY REASON FOR VISIT:  1. Endometrial cancer   2. Colon polyps   3. History of breast cancer   4. Family history of colon cancer   5. Family history of brain cancer      HISTORY OF PRESENT ILLNESS:   Laurie Benson, a 49 y.o. female, was seen for a [place] cancer genetics consultation at the request of Dr. Alycia Rossetti due to a personal and family history of cancer.  Laurie Benson presents to clinic today to discuss the possibility of a hereditary predisposition to cancer, genetic testing, and to further clarify her future cancer risks, as well as potential cancer risks for family members.   In 2010, at the age of 77, Laurie Benson was diagnosed with DCIS of the breast. This was treated with double mastectomy but no chemotherapy or radiation was needed.  The tumor was ER-/PR-.  In 2015, at the age of 52, Laurie Benson was diagnosed with endometrial cancer.  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   CANCER HISTORY:    Breast cancer   11/20/2011 Initial Diagnosis Breast cancer    Endometrial cancer   08/31/2014 Initial Diagnosis Endometrial cancer   09/15/2014 Surgery TRH/BSO and staging. IA UPSC    Chemotherapy      HORMONAL RISK FACTORS:  Menarche was at age 68.  First live birth at age 33.  OCP use for approximately 0 years.  Ovaries intact: no.  Hysterectomy: yes.  Menopausal status: postmenopausal.  HRT use: 0 years. Colonoscopy: yes; approximately 10 polyps. Mammogram within the last year: no. Number of breast biopsies: 1. Up to date with pelvic exams:  yes. Any excessive radiation exposure in the past:  no  Past Medical History  Diagnosis Date  . Anxiety   . Depression   . Complication of anesthesia     heard talking during teeth  extraction  . Breast cancer   . Cancer 2012    bilat br ca  . Uterine cancer   . Family history of colon cancer   . Family history of brain cancer     Past Surgical History  Procedure Laterality Date  . Breast surgery  2012    bilat mastectomy-rt snbx  . Tubal ligation    . Cesarean section      x2  . Multiple tooth extractions    . Breast reconstruction  11/20/2011    Procedure: BREAST RECONSTRUCTION;  Surgeon: Theodoro Kos, DO;  Location: [facility];  Service: Plastics;  Laterality: Bilateral;  bilateral implant exchange for breast reconstruction and capsulectomy    History   Social History  . Marital Status: Legally Separated    Spouse Name: N/A    Number of Children: N/A  . Years of Education: N/A   Social History Main Topics  . Smoking status: Heavy Tobacco Smoker -- 1.00 packs/day for 14 years    Types: Cigarettes  . Smokeless tobacco: Never Used  . Alcohol Use: Yes  . Drug Use: No  . Sexual Activity: Not Currently   Other Topics Concern  . None   Social History Narrative     FAMILY HISTORY:  We obtained a detailed, 4-generation family history.  Significant diagnoses are listed below: Family History  Problem Relation Age  of Onset  . Colon cancer Father 60  . Brain cancer Paternal Uncle 61  . Cancer Paternal Grandmother     cancer of her "eyes"   Laurie Benson has two full brothers and one full sister, and one maternal half brother.  Her sister died at age 24 from suicide, but other siblings are cancer free.  Her full brothers may have had colon polyps.  Laurie Benson has six children - three boys and three girls, ranging in age of 25-32.  Laurie Benson' mother is one of 12 siblings, none of whom had cancer.  Her father had mutiple brothers and sisters but she is not familiar with them. The exception is that she knows his brother had a brain tumor at 52.   Patient's maternal ancestors are of [place] descent, and paternal ancestors are of [place] descent.  There is no reported Ashkenazi Jewish ancestry. There is no known consanguinity.  GENETIC COUNSELING ASSESSMENT: Laurie Benson is a 49 y.o. female with a personal and family history of cancer which somewhat suggestive of a Lynch syndrome or other hereditary cancer syndrome and predisposition to cancer. We, therefore, discussed and recommended the following at today's visit.   DISCUSSION: We reviewed the characteristics, features and inheritance patterns of hereditary cancer syndromes. 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  In some families, we can see breast cancer with MSH6 mutations.  However, Laurie Benson' breast cancer was ER-/PR- and premenopausal which is also suggestive of a hereditary breast cancer syndrome.  We discussed BRCA mutations and PALB2 mutations associated with triple negative test results.  We also discussed genetic testing, including the appropriate family members to test, the process of testing, insurance coverage and turn-around-time for results. We discussed the implications of a negative, positive and/or variant of uncertain significant result. We recommended Laurie Benson pursue genetic testing for the MSH6 and BreastNext gene panel.  I have asked that these tests be run concurrently.   PLAN: After considering the risks, benefits, and limitations,Laurie Benson  provided informed consent to pursue genetic testing and the blood sample was sent to [facility] for analysis of the MSH6 and BreastNext Panel test.The BreastNext gene panel offered by [employer] and includes sequencing and rearrangement analysis for the following 17 genes: ATM, BARD1, BRCA1, BRCA2, BRIP1, CDH1, CHEK2, MRE11A, MUTYH, NBN, NF1, PALB2, PTEN, RAD50, RAD51C, RAD51D, and TP53. Results should be available within approximately 3-4 weeks' time, at which point they will be disclosed by telephone to Laurie Benson, as will any additional recommendations warranted by these results. Laurie Benson will  receive a summary of her genetic counseling visit and a copy of her results once available. This information will also be available in Epic. We encouraged Laurie Benson to remain in contact with cancer genetics annually so that we can continuously update the family history and inform her of any changes in cancer genetics and testing that may be of benefit for her family. Laurie Benson questions were answered to her satisfaction today. Our contact information was provided should additional questions or concerns arise.  Lastly, we encouraged Laurie Benson to remain in contact with cancer genetics annually so that we can continuously update the family history and inform her of any changes in cancer genetics and testing that may be of benefit for this family.   Laurie Benson.  TAPPAN questions were answered to her satisfaction today. Our contact information was provided should additional questions or concerns arise. Thank you for the referral and allowing [place] to  share in the care of your patient.   Jacobey Gura P. Florene Glen, [place], [facility] Certified Genetic Counselor [email or web address] phone: [phone]  The patient was seen for a total of 60 minutes in face-to-face genetic counseling.  This patient was discussed with Drs. Magrinat, Lindi Adie and/or Burr Medico who agrees with the above.    _______________________________________________________________________ For Office Staff:  Number of people involved in session: 2 Was an Intern/ student involved with case: no

## 2014-11-30 ENCOUNTER — Ambulatory Visit (HOSPITAL_BASED_OUTPATIENT_CLINIC_OR_DEPARTMENT_OTHER): Payer: Medicare Other | Admitting: Oncology

## 2014-11-30 ENCOUNTER — Ambulatory Visit: Payer: Medicare Other

## 2014-11-30 ENCOUNTER — Telehealth: Payer: Self-pay | Admitting: Oncology

## 2014-11-30 ENCOUNTER — Encounter: Payer: Self-pay | Admitting: Oncology

## 2014-11-30 VITALS — BP 130/80 | HR 107 | Temp 99.5°F | Resp 18 | Ht 63.0 in | Wt 137.3 lb

## 2014-11-30 DIAGNOSIS — Z720 Tobacco use: Secondary | ICD-10-CM

## 2014-11-30 DIAGNOSIS — C541 Malignant neoplasm of endometrium: Secondary | ICD-10-CM

## 2014-11-30 DIAGNOSIS — Z853 Personal history of malignant neoplasm of breast: Secondary | ICD-10-CM

## 2014-11-30 DIAGNOSIS — F3181 Bipolar II disorder: Secondary | ICD-10-CM

## 2014-11-30 MED ORDER — DEXAMETHASONE 4 MG PO TABS: ORAL_TABLET | ORAL | Status: DC

## 2014-11-30 NOTE — Progress Notes (Signed)
OFFICE PROGRESS NOTE   11-30-2014  Physicians:Paola Gehrig/ [name], Gerald Stabs RIchardson (gyn [place]), Erroll Luna, Louretta Parma [place], PCP [place] in [place] (psychiatry, [facility] in [place])  INTERVAL HISTORY:  Patient is seen, together with daughter [name], in continuing attention to adjuvant chemotherapy in process for IA serous/ endometrioid carcinoma of uterus, having had cycle 2 carboplatin taxol on 11-23-14. She has tolerated treatment very well thus far. She has not needed gCSF. She met with genetics counselor on 11-29-14, testing with BreastNext panel sent.  Patient reports no nausea. She did not have significant taxol aches this cycle, so has not been given another percocet prescription. Bowels are moving. Peripheral IV access reportedly not difficult. She has not been very fatigued. Minimal neuropathy tips of thumbs only.   She continues to smoke, discussed.   No PAC Flu vaccine done Needs genetics counseling Ongoing tobacco  NOTE: after missing 2 appointments, patient let medical staff know that she is illiterate  ONCOLOGIC HISTORY   Breast cancer   11/20/2011 Initial Diagnosis Breast cancer    Endometrial cancer   08/31/2014 Initial Diagnosis Endometrial cancer   09/15/2014 Surgery TRH/BSO and staging. IA UPSC    Chemotherapy   Patient had very heavy vaginal bleeding perhaps a year ago, seen by gyn in [place] and recalls that PAP was ok. She continued to have heavy menses and spotting between periods, seen back by Dr Ellouise Newer with ultrasound 07-27-14 which showed uterus 8x5x5 cm and biopsy, with 2 cm endometrial thickness and 2 cm intramural fibroid, normal appearing ovaries. Endometrial biopsy by Dr Marvel Plan 08-17-14 revealed FIGO grade 2 endometrial adenocarcinoma, with immunostains for p53 and p16 focally + supporting possible serous papillary component. She was seen in consultation by Dr Denman George on 08-31-14, with CT  scheduled and recommendation for surgery followed possibly by adjuvant therapy. Genetics testing was also recommended, with concern for Lynch syndrome. CT AP 09-04-14 had LLL pulmonary scaring with lung bases otherwise clear, 1.6 cm hypodense lesion inferior posterior right hepatic lobe of unclear etiology, no adenopathy, no free fluid, ovaries normal, inhomogeneity of endometrium. Due to availability for surgery, she was referred to [facility], with surgery 09-15-14 by Dr Alycia Rossetti which was robotic assisted total laparoscopic hysterectomy, BSO, bilateral pelvic and para-arotic lymphednectomy. Pathology [facility] [phone]) from 09-15-14 found mixed adenocarcinoma ( 70% endometroid grade 3 and 30% serous grade 2) involving endometrium, with total size 6.5 x 2.8 x 0.8 cm and 2 mm myometrial invasion where wall 2.2 cm thick (9%). There was no involvement of serosa, lower uterine segment, cervix, adnexa or ovaries. There was no LVSI. Nodes: 3 right periaortic, no identified left periaortic (due to anatomy irregular on left at time of surgery), 5 right pelvic and 3 left pelvic nodes negative. ER + 90%. Pathologic stage IA. Operative note does not describe posterior liver region. Her case was discussed at [place] at [facility] on 10-04-14, with recommendation for adjuvant carboplatin taxol, and vaginal brachytherapy. 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  Patient was DC home day after surgery. She saw Dr Alycia Rossetti on 10-02-14 with small left vaginal cuff separation which was no longer apparent by 10-19-14. First carbo taxol 11-01-14  Review of systems as above, also: No bleeding. No abdominal or pelvic pain. No SOB. No LE swelling. Is able to sleep.  Remainder of 10 point Review of Systems negative.  Objective:  Vital signs in last 24 hours:  BP 130/80 mmHg  Pulse 107   Temp(Src) 99.5 F (37.5 C) (Oral)  Resp 18  Ht 5' 3"  (1.6 m)  Wt 137 lb 4.8 oz (62.279 kg)  BMI 24.33 kg/m2  SpO2 100%  Alert, oriented and appropriate. Ambulatory without difficulty. Looks entirely comfortable. Daughter supportive Alopecia  HEENT:PERRL, sclerae not icteric. Oral mucosa moist without lesions, posterior pharynx clear.  Neck supple. No JVD.  Lymphatics:no cervical,supraclavicular, axillary or inguinal adenopathy Resp: clear to auscultation bilaterally and normal percussion bilaterally Cardio: regular rate and rhythm. No gallop. GI: soft, nontender, not distended, no mass or organomegaly.  A few bowel sounds. Surgical incision not remarkable. Musculoskeletal/ Extremities: without pitting edema, cords, tenderness Neuro: no significant peripheral neuropathy. Otherwise nonfocal. PSYCH appropriate mood and affect Skin without rash, ecchymosis, petechiae Breasts: without dominant mass, skin or nipple findings. Axillae benign.   Lab Results:  Results for orders placed or performed in visit on 11/29/14  Comprehensive metabolic panel (Cmet) - [facility]  Result Value Ref Range   Sodium 141 136 - 145 mEq/L   Potassium 3.8 3.5 - 5.1 mEq/L   Chloride 107 98 - 109 mEq/L   CO2 20 (L) 22 - 29 mEq/L   Glucose 111 70 - 140 mg/dl   BUN 14.4 7.0 - 26.0 mg/dL   Creatinine 0.8 0.6 - 1.1 mg/dL   Total Bilirubin 0.82 0.20 - 1.20 mg/dL   Alkaline Phosphatase 54 40 - 150 U/L   AST 14 5 - 34 U/L   ALT 12 0 - 55 U/L   Total Protein 7.3 6.4 - 8.3 g/dL   Albumin 4.1 3.5 - 5.0 g/dL   Calcium 9.5 8.4 - 10.4 mg/dL   Anion Gap 13 (H) 3 - 11 mEq/L   EGFR 84 (L) >90 ml/min/1.73 m2  CBC with Differential  Result Value Ref Range   WBC 5.5 3.9 - 10.3 10e3/uL   NEUT# 3.0 1.5 - 6.5 10e3/uL   HGB 12.1 11.6 - 15.9 g/dL   HCT 36.8 34.8 - 46.6 %   Platelets 216 145 - 400 10e3/uL   MCV 86.2 79.5 - 101.0 fL   MCH 28.3 25.1 - 34.0 pg   MCHC 32.9 31.5 - 36.0 g/dL   RBC 4.27 3.70 - 5.45 10e6/uL   RDW  14.7 (H) 11.2 - 14.5 %   lymph# 2.3 0.9 - 3.3 10e3/uL   MONO# 0.1 0.1 - 0.9 10e3/uL   Eosinophils Absolute 0.1 0.0 - 0.5 10e3/uL   Basophils Absolute 0.0 0.0 - 0.1 10e3/uL   NEUT% 54.4 38.4 - 76.8 %   LYMPH% 42.5 14.0 - 49.7 %   MONO% 1.8 0.0 - 14.0 %   EOS% 0.9 0.0 - 7.0 %   BASO% 0.4 0.0 - 2.0 %     Studies/Results:  No results found.  Medications: I have reviewed the patient's current medications. Continue decadron, compazine and EMEND as chemo premedications, chosen due to multiple psychiatric meds, and very effective thus far.  DISCUSSION  Assessment/Plan: 1. 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 Post laparoscopic hysterectomy, BSO, nodes at [facility] 09-15-14. IHC testing on [facility] path not available to me presently. First carbo taxol q 3 week schedule 11-01-14, tolerated well thus far. I will see her back also next week with labs. Due cycle 2 on 11-22-14. Per  Dr Elenora Gamma [facility] note 10-02-14, she needs vaginal brachytherapy;  referral to radiation oncology requested. Genetics testing sent 11-29-14 , pending. 2.personal hx DCIS right breast, post right mastectomy with sentinel node and prophylactic left mastectomy 12-2010, with bilateral reconstructions 3. Mild anemia with history of heavy vaginal bleeding prior to diagnosis: improving on oral iron even with chemo 4.ongoing tobacco, 10 pack year history. Again encouraged cessation. 5. Colon polyps: followed with yearly colonoscopies. I will send my information to GI physician when she lets me know who that is 6.Colon cancer in father who died age 60, brain cancer in paternal uncle age 24s and "eye cancer" in paternal grandmother.  7. Per psychiatrist Dr Kennyth Lose at [facility] mental health [phone]), diagnosis is Bipolar 2 disorder (hypomanic),  history of PTSD and anxiety. 8. Previous C sections x 2 9.frequent HSV lip lesions    All questions answered. Patient is pleased that she is tolerating chemo this well. She will be seen with labs 12-11-14 prior to cycle 3 on 12-14-14. She will need decadron refilled prior to cycle 3.   Gordy Levan, MD   11/30/2014, 2:39 PM

## 2014-11-30 NOTE — Telephone Encounter (Signed)
per pof ot sch pt appt-pt referred to [facility] pt copy of sch

## 2014-12-01 ENCOUNTER — Telehealth: Payer: Self-pay | Admitting: *Deleted

## 2014-12-01 NOTE — Telephone Encounter (Signed)
Per staff message and POF I have scheduled appts. Advised scheduler of appts and to move lab appt. JMW  

## 2014-12-02 ENCOUNTER — Other Ambulatory Visit: Payer: Self-pay | Admitting: Oncology

## 2014-12-02 DIAGNOSIS — C541 Malignant neoplasm of endometrium: Secondary | ICD-10-CM

## 2014-12-04 ENCOUNTER — Ambulatory Visit: Payer: Medicare Other | Admitting: Oncology

## 2014-12-04 ENCOUNTER — Other Ambulatory Visit: Payer: Medicare Other

## 2014-12-04 ENCOUNTER — Telehealth: Payer: Self-pay | Admitting: *Deleted

## 2014-12-04 DIAGNOSIS — C541 Malignant neoplasm of endometrium: Secondary | ICD-10-CM

## 2014-12-04 MED ORDER — DEXAMETHASONE 4 MG PO TABS: ORAL_TABLET | ORAL | Status: DC

## 2014-12-04 NOTE — Telephone Encounter (Signed)
Refill for decadron sent as noted below by Dr. Marko Plume. Called and spoke with patient and let her know this was done. Patient agreeable to pick up prescription. While on the phone, I reviewed future appts with patient. She states she was unaware of appts on 12/11/14. While on the phone, patient had her best friend write down appts for 12/11/14 and also confirmed chemo on 12/14/14.

## 2014-12-04 NOTE — Telephone Encounter (Signed)
-----   Message from Gordy Levan, MD sent at 12/02/2014  2:51 PM EST ----- Will need decadron refill for cycle 3 on 2-11. I see her 2-8, but ok to send it in before. Please do #10 with 3 refills.  We need to note that she is illiterate in some alert place in EMR - please do this or ask Mervyn Skeeters where to put it  thanks

## 2014-12-10 ENCOUNTER — Other Ambulatory Visit: Payer: Self-pay | Admitting: Oncology

## 2014-12-11 ENCOUNTER — Encounter: Payer: Self-pay | Admitting: Oncology

## 2014-12-11 ENCOUNTER — Ambulatory Visit (HOSPITAL_BASED_OUTPATIENT_CLINIC_OR_DEPARTMENT_OTHER): Payer: Medicare Other | Admitting: Oncology

## 2014-12-11 ENCOUNTER — Telehealth: Payer: Self-pay | Admitting: Oncology

## 2014-12-11 ENCOUNTER — Other Ambulatory Visit (HOSPITAL_BASED_OUTPATIENT_CLINIC_OR_DEPARTMENT_OTHER): Payer: Medicare Other

## 2014-12-11 VITALS — BP 119/58 | HR 70 | Temp 98.8°F | Resp 18 | Ht 63.0 in | Wt 140.8 lb

## 2014-12-11 DIAGNOSIS — K635 Polyp of colon: Secondary | ICD-10-CM

## 2014-12-11 DIAGNOSIS — Z720 Tobacco use: Secondary | ICD-10-CM

## 2014-12-11 DIAGNOSIS — Z8659 Personal history of other mental and behavioral disorders: Secondary | ICD-10-CM

## 2014-12-11 DIAGNOSIS — C541 Malignant neoplasm of endometrium: Secondary | ICD-10-CM

## 2014-12-11 DIAGNOSIS — Z853 Personal history of malignant neoplasm of breast: Secondary | ICD-10-CM

## 2014-12-11 DIAGNOSIS — D701 Agranulocytosis secondary to cancer chemotherapy: Secondary | ICD-10-CM

## 2014-12-11 DIAGNOSIS — Z800 Family history of malignant neoplasm of digestive organs: Secondary | ICD-10-CM

## 2014-12-11 DIAGNOSIS — B001 Herpesviral vesicular dermatitis: Secondary | ICD-10-CM

## 2014-12-11 DIAGNOSIS — G620 Drug-induced polyneuropathy: Secondary | ICD-10-CM

## 2014-12-11 DIAGNOSIS — T451X5A Adverse effect of antineoplastic and immunosuppressive drugs, initial encounter: Secondary | ICD-10-CM

## 2014-12-11 DIAGNOSIS — F3181 Bipolar II disorder: Secondary | ICD-10-CM

## 2014-12-11 DIAGNOSIS — F419 Anxiety disorder, unspecified: Secondary | ICD-10-CM

## 2014-12-11 DIAGNOSIS — D649 Anemia, unspecified: Secondary | ICD-10-CM

## 2014-12-11 LAB — CBC WITH DIFFERENTIAL/PLATELET
BASO%: 1.2 % (ref 0.0–2.0)
Basophils Absolute: 0.1 10*3/uL (ref 0.0–0.1)
EOS ABS: 0.1 10*3/uL (ref 0.0–0.5)
EOS%: 2.2 % (ref 0.0–7.0)
HCT: 33.7 % — ABNORMAL LOW (ref 34.8–46.6)
HGB: 10.8 g/dL — ABNORMAL LOW (ref 11.6–15.9)
LYMPH#: 2.7 10*3/uL (ref 0.9–3.3)
LYMPH%: 53.3 % — ABNORMAL HIGH (ref 14.0–49.7)
MCH: 28.1 pg (ref 25.1–34.0)
MCHC: 32.1 g/dL (ref 31.5–36.0)
MCV: 87.6 fL (ref 79.5–101.0)
MONO#: 0.4 10*3/uL (ref 0.1–0.9)
MONO%: 7.7 % (ref 0.0–14.0)
NEUT%: 35.6 % — ABNORMAL LOW (ref 38.4–76.8)
NEUTROS ABS: 1.8 10*3/uL (ref 1.5–6.5)
Platelets: 267 10*3/uL (ref 145–400)
RBC: 3.85 10*6/uL (ref 3.70–5.45)
RDW: 17.2 % — AB (ref 11.2–14.5)
WBC: 5.1 10*3/uL (ref 3.9–10.3)

## 2014-12-11 LAB — COMPREHENSIVE METABOLIC PANEL (CC13)
ALBUMIN: 3.6 g/dL (ref 3.5–5.0)
ALT: 11 U/L (ref 0–55)
AST: 13 U/L (ref 5–34)
Alkaline Phosphatase: 54 U/L (ref 40–150)
Anion Gap: 10 mEq/L (ref 3–11)
BUN: 4.3 mg/dL — ABNORMAL LOW (ref 7.0–26.0)
CO2: 26 mEq/L (ref 22–29)
Calcium: 9.1 mg/dL (ref 8.4–10.4)
Chloride: 109 mEq/L (ref 98–109)
Creatinine: 0.9 mg/dL (ref 0.6–1.1)
EGFR: 79 mL/min/{1.73_m2} — ABNORMAL LOW (ref >90)
Glucose: 102 mg/dl (ref 70–140)
Potassium: 4 mEq/L (ref 3.5–5.1)
Sodium: 145 mEq/L (ref 136–145)
TOTAL PROTEIN: 6.4 g/dL (ref 6.4–8.3)
Total Bilirubin: <0.2 mg/dL (ref 0.20–1.20)

## 2014-12-11 MED ORDER — DEXAMETHASONE 4 MG PO TABS: ORAL_TABLET | ORAL | Status: DC

## 2014-12-11 NOTE — Telephone Encounter (Signed)
gv and printed appt sched and avs forpt for Feb and March °

## 2014-12-11 NOTE — Progress Notes (Signed)
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  She is to see Dr Sondra Come in consultation 12-27-14.  Patient has been more fatigued since second chemotherapy on 11-23-14; she has not had gCSF thus far, but will need that after cycle 3 with ANC just 1.8 today. She has numbness in tips of fingers bilaterally, which is not interfering with fine motor skills, none in feet. Taxol aches were primarily neck to mid back days 3-4-5, better with heating pad and some "OTC pain medicine". She has had no nausea and bowels are moving. She feels a little dizzy when she stands at times, is drinking more tea and pepsi than gatorade and water, discussed.   No PAC Flu vaccine done Genetics testing with BreastNext panel sent 11-29-14 and pending. Ongoing tobacco  Review of systems as above, also: No fever or symptoms of infection. No SOB. No abdominal or pelvic discomfort. No bleeding. Remainder of 10 point Review of Systems negative.  Daughter [name] is expecting baby boy end of this month.   ONCOLOGIC HISTORY Patient had heavy vaginal bleeding perhaps a year ago, seen by gyn in [place] and recalls that PAP was ok. She continued to have heavy menses and spotting between periods, seen back by Dr Ellouise Newer with ultrasound 07-27-14 which showed uterus 8x5x5 cm and biopsy, with 2 cm endometrial thickness and 2 cm intramural fibroid, normal appearing ovaries. Endometrial biopsy by Dr Marvel Plan 08-17-14 revealed FIGO grade 2 endometrial adenocarcinoma, with immunostains for p53 and p16 focally +  supporting possible serous papillary component. She was seen in consultation by Dr Denman George on 08-31-14, with CT scheduled and recommendation for surgery followed possibly by adjuvant therapy. Genetics testing was also recommended, with concern for Lynch syndrome. CT AP 09-04-14 had LLL pulmonary scaring with lung bases otherwise clear, 1.6 cm hypodense lesion inferior posterior right hepatic lobe of unclear etiology, no adenopathy, no free fluid, ovaries normal, inhomogeneity of endometrium. Surgery was at [facility] 09-15-14 by Dr Alycia Rossetti, robotic assisted total laparoscopic hysterectomy, BSO, bilateral pelvic and para-arotic lymphednectomy. Pathology [facility] [phone]) from 09-15-14 found mixed adenocarcinoma ( 70% endometroid grade 3 and 30% serous grade 2) involving endometrium, with total size 6.5 x 2.8 x 0.8 cm and 2 mm myometrial invasion where wall 2.2 cm thick (9%). There was no involvement of serosa, lower uterine segment, cervix, adnexa or ovaries. There was no LVSI. Nodes: 3 right periaortic, no identified left periaortic (due to anatomy irregular on left at time of surgery), 5 right pelvic and 3 left pelvic nodes negative. ER + 90%. Pathologic stage IA. Operative note does not describe posterior liver region. Her case was discussed at [place] at [facility] on 10-04-14, with recommendation for adjuvant carboplatin taxol, and vaginal brachytherapy. 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  Patient was DC home day after surgery. She saw Dr Alycia Rossetti on 10-02-14 with small left vaginal cuff separation which was no longer apparent by 10-19-14. First carbo taxol 11-01-14  Patient also has history of DCIS right breast, post right mastectomy  with sentinel node and prophylactic left mastectomy 12-2010, with bilateral reconstructions.  Objective:  Vital signs in last 24  hours:  BP 119/58 mmHg  Pulse 70  Temp(Src) 98.8 F (37.1 C) (Oral)  Resp 18  Ht _0  (1.6 m)  Wt 140 lb 12.8 oz (63.866 kg)  BMI 24.95 kg/m2  SpO2 100% Weight is up 3 lbs. Alert, oriented and appropriate. Ambulatory without difficulty.  Alopecia  HEENT:PERRL, sclerae not icteric. Oral mucosa moist without lesions, posterior pharynx clear.  Neck supple. No JVD. Resolving herpetic lesion upper lip. Lymphatics:no cervical,supraclavicular or inguinal adenopathy Resp: clear to auscultation bilaterally and normal percussion bilaterally Cardio: regular rate and rhythm. No gallop. GI: soft, nontender, not more distended, no mass or organomegaly. Normally active bowel sounds. Surgical incision closed, not remarkable. Musculoskeletal/ Extremities: without pitting edema, cords, tenderness Neuro:  peripheral neuropathy tips of fingers. Otherwise nonfocal. PSYCH appropriate mood and affect Skin without rash, ecchymosis, petechiae  Lab Results:  Results for orders placed or performed in visit on 12/11/14  CBC with Differential  Result Value Ref Range   WBC 5.1 3.9 - 10.3 10e3/uL   NEUT# 1.8 1.5 - 6.5 10e3/uL   HGB 10.8 (L) 11.6 - 15.9 g/dL   HCT 33.7 (L) 34.8 - 46.6 %   Platelets 267 145 - 400 10e3/uL   MCV 87.6 79.5 - 101.0 fL   MCH 28.1 25.1 - 34.0 pg   MCHC 32.1 31.5 - 36.0 g/dL   RBC 3.85 3.70 - 5.45 10e6/uL   RDW 17.2 (H) 11.2 - 14.5 %   lymph# 2.7 0.9 - 3.3 10e3/uL   MONO# 0.4 0.1 - 0.9 10e3/uL   Eosinophils Absolute 0.1 0.0 - 0.5 10e3/uL   Basophils Absolute 0.1 0.0 - 0.1 10e3/uL   NEUT% 35.6 (L) 38.4 - 76.8 %   LYMPH% 53.3 (H) 14.0 - 49.7 %   MONO% 7.7 0.0 - 14.0 %   EOS% 2.2 0.0 - 7.0 %   BASO% 1.2 0.0 - 2.0 %  Comprehensive metabolic panel (Cmet) - [facility]  Result Value Ref Range   Sodium 145 136 - 145 mEq/L   Potassium 4.0 3.5 - 5.1 mEq/L   Chloride 109 98 - 109 mEq/L   CO2 26 22 - 29 mEq/L   Glucose 102 70 - 140 mg/dl   BUN 4.3 (L) 7.0 - 26.0 mg/dL   Creatinine  0.9 0.6 - 1.1 mg/dL   Total Bilirubin <0.20 0.20 - 1.20 mg/dL   Alkaline Phosphatase 54 40 - 150 U/L   AST 13 5 - 34 U/L   ALT 11 0 - 55 U/L   Total Protein 6.4 6.4 - 8.3 g/dL   Albumin 3.6 3.5 - 5.0 g/dL   Calcium 9.1 8.4 - 10.4 mg/dL   Anion Gap 10 3 - 11 mEq/L   EGFR 79 (L) >90 ml/min/1.73 m2     Studies/Results:  No results found.  Medications: I have reviewed the patient's current medications. Decadron premed for cycle 3 chemo refilled. She is using acyclovir topical for the fever blister  DISCUSSION: WBC lower than previously tho ok for chemo as planned on 12-14-14. I have recommended that she have gCSF, explaining rationale for this and possibility of increased aches also from that medication. I will coordinate my visit with gCSF injection ~ a week after upcoming chemotherapy.  DIscussed pushing noncaffeinated fluids.  ASSESSMENT and PLAN 1. 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 Tolerating chemotherapy adequately, cycle 3 planned 12-14-14 then will use gCSF when I see her back on 12-21-14. Consultation with Dr Sondra Come 12-27-14. Antiemetics chosen to give least interaction with multiple psychiatric medications. Genetics testing sent 11-29-14 , pending. 2.personal hx DCIS right breast, post right mastectomy with sentinel node and prophylactic left mastectomy 12-2010, with bilateral reconstructions 3. Mild anemia with history of heavy vaginal bleeding prior to diagnosis: on oral iron, follow 4.ongoing tobacco, 10 pack year history. Continuing to address 5. Colon polyps: followed with yearly colonoscopies. I will send my information to GI physician when she lets me know who that physician is 6.Colon cancer in father who died age 20, brain cancer in paternal uncle age 68s and "eye cancer" in paternal grandmother.  7. Per psychiatrist Dr  Kennyth Lose at [facility] mental health [phone]), diagnosis is Bipolar 2 disorder (hypomanic), history of PTSD and anxiety. 8. Previous C sections x 2 9.frequent HSV lip lesions: improving more rapidly now with topical acyclovir 10.PATIENT IS ILLITERATE    All questions answered. Patient understands discussion and is in agreement with plan. Chemo orders and neulasta order placed. Message to financial staff re neulasta preauthorization    Gordy Levan, MD   12/11/2014, 4:26 PM

## 2014-12-12 ENCOUNTER — Encounter: Payer: Self-pay | Admitting: Oncology

## 2014-12-12 DIAGNOSIS — T451X5A Adverse effect of antineoplastic and immunosuppressive drugs, initial encounter: Secondary | ICD-10-CM

## 2014-12-12 DIAGNOSIS — G620 Drug-induced polyneuropathy: Secondary | ICD-10-CM | POA: Insufficient documentation

## 2014-12-12 HISTORY — DX: Adverse effect of antineoplastic and immunosuppressive drugs, initial encounter: T45.1X5A

## 2014-12-12 NOTE — Progress Notes (Unsigned)
Medical Oncology  Per [facility] managed care, no preauth needed for neulasta or neupogen/ granix with medicare.  Godfrey Pick, MD

## 2014-12-13 ENCOUNTER — Other Ambulatory Visit: Payer: Self-pay | Admitting: *Deleted

## 2014-12-13 DIAGNOSIS — C541 Malignant neoplasm of endometrium: Secondary | ICD-10-CM

## 2014-12-13 MED ORDER — FERROUS FUMARATE 325 (106 FE) MG PO TABS: 1.0000 | ORAL_TABLET | Freq: Every day | ORAL | Status: DC

## 2014-12-13 NOTE — Telephone Encounter (Signed)
Called pt to let her know to pick up her Rx for iron at her Pharmacy. Pt verbalized understanding.

## 2014-12-14 ENCOUNTER — Ambulatory Visit (HOSPITAL_BASED_OUTPATIENT_CLINIC_OR_DEPARTMENT_OTHER): Payer: Medicare Other

## 2014-12-14 ENCOUNTER — Other Ambulatory Visit: Payer: Medicare Other

## 2014-12-14 ENCOUNTER — Other Ambulatory Visit: Payer: Self-pay | Admitting: Oncology

## 2014-12-14 ENCOUNTER — Other Ambulatory Visit (HOSPITAL_BASED_OUTPATIENT_CLINIC_OR_DEPARTMENT_OTHER): Payer: Medicare Other

## 2014-12-14 DIAGNOSIS — C541 Malignant neoplasm of endometrium: Secondary | ICD-10-CM

## 2014-12-14 DIAGNOSIS — Z5111 Encounter for antineoplastic chemotherapy: Secondary | ICD-10-CM

## 2014-12-14 LAB — COMPREHENSIVE METABOLIC PANEL (CC13)
ALT: 10 U/L (ref 0–55)
ANION GAP: 12 meq/L — AB (ref 3–11)
AST: 13 U/L (ref 5–34)
Albumin: 3.9 g/dL (ref 3.5–5.0)
Alkaline Phosphatase: 62 U/L (ref 40–150)
BUN: 10.7 mg/dL (ref 7.0–26.0)
CHLORIDE: 110 meq/L — AB (ref 98–109)
CO2: 23 mEq/L (ref 22–29)
Calcium: 9.4 mg/dL (ref 8.4–10.4)
Creatinine: 1 mg/dL (ref 0.6–1.1)
EGFR: 64 mL/min/{1.73_m2} — ABNORMAL LOW (ref >90)
Glucose: 175 mg/dl — ABNORMAL HIGH (ref 70–140)
POTASSIUM: 3.8 meq/L (ref 3.5–5.1)
SODIUM: 145 meq/L (ref 136–145)
TOTAL PROTEIN: 7.1 g/dL (ref 6.4–8.3)
Total Bilirubin: <0.2 mg/dL (ref 0.20–1.20)

## 2014-12-14 LAB — CBC WITH DIFFERENTIAL/PLATELET
BASO%: 0.8 % (ref 0.0–2.0)
Basophils Absolute: 0 10*3/uL (ref 0.0–0.1)
EOS%: 0.1 % (ref 0.0–7.0)
Eosinophils Absolute: 0 10*3/uL (ref 0.0–0.5)
HEMATOCRIT: 35.9 % (ref 34.8–46.6)
HGB: 11.7 g/dL (ref 11.6–15.9)
LYMPH%: 16.2 % (ref 14.0–49.7)
MCH: 28.3 pg (ref 25.1–34.0)
MCHC: 32.4 g/dL (ref 31.5–36.0)
MCV: 87.3 fL (ref 79.5–101.0)
MONO#: 0 10*3/uL — AB (ref 0.1–0.9)
MONO%: 0.6 % (ref 0.0–14.0)
NEUT#: 4 10*3/uL (ref 1.5–6.5)
NEUT%: 82.3 % — AB (ref 38.4–76.8)
PLATELETS: 260 10*3/uL (ref 145–400)
RBC: 4.12 10*6/uL (ref 3.70–5.45)
RDW: 17.5 % — ABNORMAL HIGH (ref 11.2–14.5)
WBC: 4.8 10*3/uL (ref 3.9–10.3)
lymph#: 0.8 10*3/uL — ABNORMAL LOW (ref 0.9–3.3)

## 2014-12-14 MED ORDER — FAMOTIDINE IN NACL 20-0.9 MG/50ML-% IV SOLN
INTRAVENOUS | Status: AC
  Filled 2014-12-14: qty 50

## 2014-12-14 MED ORDER — DIPHENHYDRAMINE HCL 50 MG/ML IJ SOLN
INTRAMUSCULAR | Status: AC
  Filled 2014-12-14: qty 1

## 2014-12-14 MED ORDER — PROCHLORPERAZINE EDISYLATE 5 MG/ML IJ SOLN
10.0000 mg | Freq: Once | INTRAMUSCULAR | Status: AC
  Administered 2014-12-14: 10 mg via INTRAVENOUS

## 2014-12-14 MED ORDER — FOSAPREPITANT DIMEGLUMINE INJECTION 150 MG
150.0000 mg | Freq: Once | INTRAVENOUS | Status: AC
  Administered 2014-12-14: 150 mg via INTRAVENOUS
  Filled 2014-12-14: qty 5

## 2014-12-14 MED ORDER — DEXAMETHASONE SODIUM PHOSPHATE 20 MG/5ML IJ SOLN
INTRAMUSCULAR | Status: AC
  Filled 2014-12-14: qty 5

## 2014-12-14 MED ORDER — FAMOTIDINE IN NACL 20-0.9 MG/50ML-% IV SOLN
20.0000 mg | Freq: Once | INTRAVENOUS | Status: AC
  Administered 2014-12-14: 20 mg via INTRAVENOUS

## 2014-12-14 MED ORDER — PACLITAXEL CHEMO INJECTION 300 MG/50ML
135.0000 mg/m2 | Freq: Once | INTRAVENOUS | Status: AC
  Administered 2014-12-14: 222 mg via INTRAVENOUS
  Filled 2014-12-14: qty 37

## 2014-12-14 MED ORDER — SODIUM CHLORIDE 0.9 % IV SOLN
Freq: Once | INTRAVENOUS | Status: AC
  Administered 2014-12-14: 11:00:00 via INTRAVENOUS

## 2014-12-14 MED ORDER — PROCHLORPERAZINE EDISYLATE 5 MG/ML IJ SOLN
INTRAMUSCULAR | Status: AC
Start: 2014-12-14 — End: 2014-12-14
  Filled 2014-12-14: qty 2

## 2014-12-14 MED ORDER — DEXAMETHASONE SODIUM PHOSPHATE 20 MG/5ML IJ SOLN
12.0000 mg | Freq: Once | INTRAMUSCULAR | Status: AC
  Administered 2014-12-14: 12 mg via INTRAVENOUS

## 2014-12-14 MED ORDER — SODIUM CHLORIDE 0.9 % IV SOLN
453.0000 mg | Freq: Once | INTRAVENOUS | Status: AC
  Administered 2014-12-14: 450 mg via INTRAVENOUS
  Filled 2014-12-14: qty 45

## 2014-12-14 MED ORDER — DIPHENHYDRAMINE HCL 50 MG/ML IJ SOLN
50.0000 mg | Freq: Once | INTRAMUSCULAR | Status: AC
  Administered 2014-12-14: 50 mg via INTRAVENOUS

## 2014-12-14 NOTE — Progress Notes (Signed)
Difficulty locating adequate PIV site for chemotherapy. Patient states she is interested in having port placed for remaining treatments, stating "i've been wanting one for a while." Barbaraann Share, RN to Dr. Marko Plume notified, to discuss with MD.

## 2014-12-14 NOTE — Patient Instructions (Signed)
[facility] Discharge Instructions for Patients Receiving Chemotherapy  Today you received the following chemotherapy agents: Taxol and Carboplatin.  To help prevent nausea and vomiting after your treatment, we encourage you to take your nausea medication as prescribed.   If you develop nausea and vomiting that is not controlled by your nausea medication, call the clinic.   BELOW ARE SYMPTOMS THAT SHOULD BE REPORTED IMMEDIATELY:  *FEVER GREATER THAN 100.5 F  *CHILLS WITH OR WITHOUT FEVER  NAUSEA AND VOMITING THAT IS NOT CONTROLLED WITH YOUR NAUSEA MEDICATION  *UNUSUAL SHORTNESS OF BREATH  *UNUSUAL BRUISING OR BLEEDING  TENDERNESS IN MOUTH AND THROAT WITH OR WITHOUT PRESENCE OF ULCERS  *URINARY PROBLEMS  *BOWEL PROBLEMS  UNUSUAL RASH Items with * indicate a potential emergency and should be followed up as soon as possible.  Feel free to call the clinic you have any questions or concerns. The clinic phone number is [phone].    

## 2014-12-15 ENCOUNTER — Telehealth: Payer: Self-pay | Admitting: Oncology

## 2014-12-15 NOTE — Telephone Encounter (Signed)
per pof ot sch pt IR-cld Tiffany she stated she will sch & call pt-adv to spk w/daughter in re to appt

## 2014-12-17 ENCOUNTER — Other Ambulatory Visit: Payer: Self-pay | Admitting: Oncology

## 2014-12-17 DIAGNOSIS — C541 Malignant neoplasm of endometrium: Secondary | ICD-10-CM

## 2014-12-21 ENCOUNTER — Ambulatory Visit: Payer: Medicare Other | Admitting: Oncology

## 2014-12-21 ENCOUNTER — Other Ambulatory Visit: Payer: Medicare Other

## 2014-12-21 ENCOUNTER — Ambulatory Visit: Payer: Medicare Other

## 2014-12-22 ENCOUNTER — Encounter: Payer: Self-pay | Admitting: Genetic Counselor

## 2014-12-22 ENCOUNTER — Ambulatory Visit (HOSPITAL_BASED_OUTPATIENT_CLINIC_OR_DEPARTMENT_OTHER): Payer: Medicare Other

## 2014-12-22 ENCOUNTER — Ambulatory Visit: Payer: Medicare Other | Admitting: Genetic Counselor

## 2014-12-22 ENCOUNTER — Other Ambulatory Visit: Payer: Self-pay | Admitting: Oncology

## 2014-12-22 ENCOUNTER — Other Ambulatory Visit: Payer: Self-pay

## 2014-12-22 ENCOUNTER — Telehealth: Payer: Self-pay | Admitting: *Deleted

## 2014-12-22 ENCOUNTER — Telehealth: Payer: Self-pay | Admitting: Genetic Counselor

## 2014-12-22 DIAGNOSIS — C541 Malignant neoplasm of endometrium: Secondary | ICD-10-CM

## 2014-12-22 DIAGNOSIS — Z1509 Genetic susceptibility to other malignant neoplasm: Secondary | ICD-10-CM | POA: Insufficient documentation

## 2014-12-22 DIAGNOSIS — D649 Anemia, unspecified: Secondary | ICD-10-CM

## 2014-12-22 DIAGNOSIS — Z1379 Encounter for other screening for genetic and chromosomal anomalies: Secondary | ICD-10-CM

## 2014-12-22 DIAGNOSIS — D0511 Intraductal carcinoma in situ of right breast: Secondary | ICD-10-CM

## 2014-12-22 HISTORY — DX: Encounter for other screening for genetic and chromosomal anomalies: Z13.79

## 2014-12-22 LAB — COMPREHENSIVE METABOLIC PANEL (CC13)
ALBUMIN: 3.8 g/dL (ref 3.5–5.0)
ALT: 47 U/L (ref 0–55)
AST: 57 U/L — AB (ref 5–34)
Alkaline Phosphatase: 66 U/L (ref 40–150)
Anion Gap: 9 mEq/L (ref 3–11)
BUN: 8.8 mg/dL (ref 7.0–26.0)
CALCIUM: 9.3 mg/dL (ref 8.4–10.4)
CHLORIDE: 106 meq/L (ref 98–109)
CO2: 26 mEq/L (ref 22–29)
Creatinine: 0.8 mg/dL (ref 0.6–1.1)
EGFR: 84 mL/min/{1.73_m2} — ABNORMAL LOW (ref >90)
Glucose: 106 mg/dl (ref 70–140)
POTASSIUM: 3.9 meq/L (ref 3.5–5.1)
SODIUM: 142 meq/L (ref 136–145)
Total Bilirubin: 0.25 mg/dL (ref 0.20–1.20)
Total Protein: 6.7 g/dL (ref 6.4–8.3)

## 2014-12-22 LAB — CBC WITH DIFFERENTIAL/PLATELET
BASO%: 0.3 % (ref 0.0–2.0)
Basophils Absolute: 0 10*3/uL (ref 0.0–0.1)
EOS%: 2.8 % (ref 0.0–7.0)
Eosinophils Absolute: 0.1 10*3/uL (ref 0.0–0.5)
HCT: 32.1 % — ABNORMAL LOW (ref 34.8–46.6)
HGB: 10.5 g/dL — ABNORMAL LOW (ref 11.6–15.9)
LYMPH%: 56 % — ABNORMAL HIGH (ref 14.0–49.7)
MCH: 28.8 pg (ref 25.1–34.0)
MCHC: 32.7 g/dL (ref 31.5–36.0)
MCV: 87.9 fL (ref 79.5–101.0)
MONO#: 0.2 10*3/uL (ref 0.1–0.9)
MONO%: 5.5 % (ref 0.0–14.0)
NEUT#: 1.2 10*3/uL — ABNORMAL LOW (ref 1.5–6.5)
NEUT%: 35.4 % — ABNORMAL LOW (ref 38.4–76.8)
Platelets: 153 10*3/uL (ref 145–400)
RBC: 3.65 10*6/uL — AB (ref 3.70–5.45)
RDW: 15.6 % — ABNORMAL HIGH (ref 11.2–14.5)
WBC: 3.3 10*3/uL — ABNORMAL LOW (ref 3.9–10.3)
lymph#: 1.8 10*3/uL (ref 0.9–3.3)

## 2014-12-22 MED ORDER — PEGFILGRASTIM INJECTION 6 MG/0.6ML
6.0000 mg | Freq: Once | SUBCUTANEOUS | Status: AC
  Administered 2014-12-22: 6 mg via SUBCUTANEOUS
  Filled 2014-12-22: qty 0.6

## 2014-12-22 NOTE — Progress Notes (Signed)
GYN Location of Tumor / Histology: [place] serous / endometrioid carcinoma of uterus  Laurie Benson presented withheavy vaginal bleeding perhaps a year ago, seen by gyn in [place] and recalls that PAP was ok. She continued to have heavy menses and spotting between periods, seen back by Dr Ellouise Newer with ultrasound 07-27-14 which showed uterus 8x5x5 cm and biopsy, with 2 cm endometrial thickness and 2 cm intramural fibroid, normal appearing ovaries. Endometrial biopsy by Dr Marvel Plan 08-17-14 revealed FIGO grade 2 endometrial adenocarcinoma, with immunostains for p53 and p16 focally + supporting possible serous papillary component.     Biopsies revealed:   09/15/14 A:Uterus, cervix, bilateral tubes and ovaries, hysterectomy and bilateral  salpingo-oophorectomy  Histologic type:mixed subtype adenocarcinoma: endometrioid adenocarcinoma  (70%) and 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%)(see comment)   Histologic grade:FIGO grade 3 (endometrioid adenocarcinoma: FIGO grade 2;  serous carcinoma FIGO grade 3)   Tumor site:endometrium, anterior and posterior     Tumor size (gross):6.5 x 2.8 x 0.8 cm     Myometrial invasion: Inner half   Depth:2 mmWall thickness:2.2 cmPercent: 9% (A12)  (see comment)    Serosal involvement:not identified     Lower uterine segment involvement:not identified     Cervical involvement:not identified     Adnexal involvement:not identified     Other involved sites:not applicable     Cervical/vaginal margin and distance:widely negative (>3 cm)    Lymphovascular space invasion:not identified     Regional lymph nodes (see other specimens):  Total number involved:0  Total number examined:11    Additional pathologic findings:  Right  ovary:luteinized cyst, corpus luteum, scant adhesions and  endosalpingiosis   Left ovary:luteinized hemorrhagic cyst, endosalpingiosis and dystrophic  calcifications   Right fallopian tube:Hydrosalpinx; no definite fimbria identified.   Reactive epithelium (see comment)  Left fallopian tube:chronic salpingitis and scant adhesions; no fimbria  identified  Myometrium: adenomyosis and uterine serosal adhesions  Cervix: Nabothian cysts and parakeratosis    AJCC Pathologic Stage: pT1a   FIGO (2009 classification) Stage Grouping:IA  Note:This pathologic stage assessment is based on information available at the  time of this report, and is subject to change pending clinical review and  additional information.  Best block for future studies:A12 (endometrioid carcinoma); A23 (serous  carcinoma)  Normal block for future studies:A2    B: Lymph nodes, right periaortic, dissection   - Three lymph nodes negative for metastatic carcinoma (0/3)   C: Lymph nodes, left periaortic, dissection   - Large nerve and fibroadipose connective tissue   - No lymph node identified   - No malignancy identified  - All specimen submitted for light microscopy examination  D: Lymph nodes, right pelvic, dissection  - Five lymph nodes, negative for metastatic carcinoma (0/5)   E: Lymph nodes, left pelvic, dissection   - Three lymph nodes, negative for metastatic carcinoma (0/3)  - All tissue submitted for light microscopic examination     Past/Anticipated interventions by Gyn/Onc surgery, if any: Surgery was at [facility] 09-15-14 by Dr Alycia Rossetti, robotic assisted total laparoscopic hysterectomy, BSO, bilateral pelvic and para-arotic lymphednectomy  Past/Anticipated interventions by medical oncology, if any: due cycle 3 carbo taxol on 12-14-14.   Weight changes, if any: denies unintentional weight loss; describes her appetite as  fair.  Bowel/Bladder complaints, if any: reports constipation related to "running out of my colace" denies dysuria denies hematuria denies vaginal discharge itchying odor  Nausea/Vomiting, if any: None  Pain issues, if any:  Reports chest and back pain since Sunday morning, reports  she presented to the emergency dept in [place] because she was in such pain, she was prescribed oxycodone but, "has used it up," reports her right hip hurts "extremely bad" while ambulating.  SAFETY ISSUES:  Prior radiation? no   Pacemaker/ICD? no   Possible current pregnancy? no  Is the patient on methotrexate? no  Current Complaints / other details:  Patient has lynch syndrome with MSH6 mutation. Patient also has history of DCIS right breast, post right mastectomy with sentinel node and prophylactic left mastectomy 12-2010, with bilateral reconstructions.  Reports while in the emergency room in [place] over the weekend a scan was done "and they found something on my liver."

## 2014-12-22 NOTE — Patient Instructions (Signed)
Neutropenia Neutropenia is a condition that occurs when the level of a certain type of white blood cell (neutrophil) in your body becomes lower than normal. Neutrophils are made in the bone marrow and fight infections. These cells protect against bacteria and viruses. The fewer neutrophils you have, and the longer your body remains without them, the greater your risk of getting a severe infection becomes. CAUSES  The cause of neutropenia may be hard to determine. However, it is usually due to 3 main problems:   Decreased production of neutrophils. This may be due to:  Certain medicines such as chemotherapy.  Genetic problems.  Cancer.  Radiation treatments.  Vitamin deficiency.  Some pesticides.  Increased destruction of neutrophils. This may be due to:  Overwhelming infections.  Hemolytic anemia. This is when the body destroys its own blood cells.  Chemotherapy.  Neutrophils moving to areas of the body where they cannot fight infections. This may be due to:  Dialysis procedures.  Conditions where the spleen becomes enlarged. Neutrophils are held in the spleen and are not available to the rest of the body.  Overwhelming infections. The neutrophils are held in the area of the infection and are not available to the rest of the body. SYMPTOMS  There are no specific symptoms of neutropenia. The lack of neutrophils can result in an infection, and an infection can cause various problems. DIAGNOSIS  Diagnosis is made by a blood test. A complete blood count is performed. The normal level of neutrophils in human blood differs with age and race. Infants have lower counts than older children and adults. African Americans have lower counts than Caucasians or Asians. The average adult level is 1500 cells/mm3 of blood. Neutrophil counts are interpreted as follows:  Greater than 1000 cells/mm3 gives normal protection against infection.  500 to 1000 cells/mm3 gives an increased risk for  infection.  200 to 500 cells/mm3 is a greater risk for severe infection.  Lower than 200 cells/mm3 is a marked risk of infection. This may require hospitalization and treatment with antibiotic medicines. TREATMENT  Treatment depends on the underlying cause, severity, and presence of infections or symptoms. It also depends on your health. Your caregiver will discuss the treatment plan with you. Mild cases are often easily treated and have a good outcome. Preventative measures may also be started to limit your risk of infections. Treatment can include:  Taking antibiotics.  Stopping medicines that are known to cause neutropenia.  Correcting nutritional deficiencies by eating green vegetables to supply folic acid and taking vitamin B supplements.  Stopping exposure to pesticides if your neutropenia is related to pesticide exposure.  Taking a blood growth factor called sargramostim, pegfilgrastim, or filgrastim if you are undergoing chemotherapy for cancer. This stimulates white blood cell production.  Removal of the spleen if you have Felty's syndrome and have repeated infections. HOME CARE INSTRUCTIONS   Follow your caregiver's instructions about when you need to have blood work done.  Wash your hands often. Make sure others who come in contact with you also wash their hands.  Wash raw fruits and vegetables before eating them. They can carry bacteria and fungi.  Avoid people with colds or spreadable (contagious) diseases (chickenpox, herpes zoster, influenza).  Avoid large crowds.  Avoid construction areas. The dust can release fungus into the air.  Be cautious around children in daycare or school environments.  Take care of your respiratory system by coughing and deep breathing.  Bathe daily.  Protect your skin from cuts and   burns.  Do not work in the garden or with flowers and plants.  Care for the mouth before and after meals by brushing with a soft toothbrush. If you have  mucositis, do not use mouthwash. Mouthwash contains alcohol and can dry out the mouth even more.  Clean the area between the genitals and the anus (perineal area) after urination and bowel movements. Women need to wipe from front to back.  Use a water soluble lubricant during sexual intercourse and practice good hygiene after. Do not have intercourse if you are severely neutropenic. Check with your caregiver for guidelines.  Exercise daily as tolerated.  Avoid people who were vaccinated with a live vaccine in the past 30 days. You should not receive live vaccines (polio, typhoid).  Do not provide direct care for pets. Avoid animal droppings. Do not clean litter boxes and bird cages.  Do not share food utensils.  Do not use tampons, enemas, or rectal suppositories unless directed by your caregiver.  Use an electric razor to remove hair.  Wash your hands after handling magazines, letters, and newspapers. SEEK IMMEDIATE MEDICAL CARE IF:   You have a fever.  You have chills or start to shake.  You feel nauseous or vomit.  You develop mouth sores.  You develop aches and pains.  You have redness and swelling around open wounds.  Your skin is warm to the touch.  You have pus coming from your wounds.  You develop swollen lymph nodes.  You feel weak or fatigued.  You develop red streaks on the skin. MAKE SURE YOU:  Understand these instructions.  Will watch your condition.  Will get help right away if you are not doing well or get worse. Document Released: 04/11/2002 Document Revised: 01/12/2012 Document Reviewed: 05/09/2011 ExitCare Patient Information 2015 [employer]. This information is not intended to replace advice given to you by your health care provider. Make sure you discuss any questions you have with your health care provider.  

## 2014-12-22 NOTE — Progress Notes (Signed)
REFERRING PROVIDER: Evlyn Clines, MD  PRIMARY PROVIDER:  Pcp Not In System  PRIMARY REASON FOR VISIT:  1. 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 Lynch syndrome      HISTORY OF PRESENT ILLNESS:   Ms. RABANAL, a 49 y.o. 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  Ms. HORACE presents to clinic today, with her son [name], to discuss the possibility of a hereditary predisposition to cancer, genetic testing, and to further clarify her future cancer risks, as well as potential cancer risks for family members.   In January 2016, Ms. Joy underwent genetic testing for MSH6 and BreastNext based on her personal and family history of cancer.  She was diagnosed with breast cancer at age 54 and uterine cancer at age 80.  Tumor testing on her uterine cancer suggested an MSH6 mutation.  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     CANCER HISTORY:    Breast cancer   11/20/2011 Initial Diagnosis Breast cancer    Endometrial cancer   08/31/2014 Initial Diagnosis Endometrial cancer   09/15/2014 Surgery TRH/BSO and staging. IA UPSC    Chemotherapy      Past Medical History  Diagnosis Date  . Anxiety   . Depression   . Complication of anesthesia     heard talking during teeth extraction  . Breast cancer   . Cancer 2012    bilat br ca  . Uterine cancer   . Family history of colon cancer   . Family history of brain cancer   . Lynch syndrome     MSH6 mutation    Past Surgical History  Procedure Laterality Date  . Breast surgery  2012    bilat mastectomy-rt snbx  . Tubal ligation    . Cesarean section      x2  . Multiple tooth extractions    . Breast reconstruction  11/20/2011    Procedure: BREAST RECONSTRUCTION;  Surgeon: Theodoro Kos, DO;  Location: [facility];  Service: Plastics;   Laterality: Bilateral;  bilateral implant exchange for breast reconstruction and capsulectomy    History   Social History  . Marital Status: Legally Separated    Spouse Name: N/A  . Number of Children: 6  . Years of Education: N/A   Social History Main Topics  . Smoking status: Heavy Tobacco Smoker -- 1.00 packs/day for 14 years    Types: Cigarettes  . Smokeless tobacco: Never Used  . Alcohol Use: Yes  . Drug Use: No  . Sexual Activity: Not Currently   Other Topics Concern  . None   Social History Narrative     FAMILY HISTORY:  We obtained a detailed, 4-generation family history.  Significant diagnoses are listed below: Family History  Problem Relation Age of Onset  . Colon cancer Father 12  . Brain cancer Paternal Uncle 66  . Cancer Paternal Grandmother     cancer of her "eyes"   Ms. Boivin has two full brothers and one full sister, and one maternal half brother. Her sister died at age 1 from suicide, but other siblings are cancer free. Her full brothers may have had colon polyps. MS. Dragovich has six children - three boys and three girls, ranging in age of 24-32. Ms. Yerby' mother is one of 12 siblings, none of whom had cancer.  Her father had mutiple brothers and sisters but she is not familiar with them. The exception is that she knows his brother had a brain tumor at 63. Today, Ms, Faerber came to clinic with her son [name].  [name] indicated that he has blood in his stool, and had severe abdominal cramping last week. Patient's maternal ancestors are of [place] descent, and paternal ancestors are of [place] descent. There is no reported Ashkenazi Jewish ancestry. There is no known consanguinity.  GENETIC COUNSELING ASSESSMENT: RENAY CRAMMER is a 49 y.o. female with a diagnosed with Lynch syndrome predisposition to cancer. We, therefore, discussed and recommended the following at today's visit.   DISCUSSION:  Because of the atypical tumor studies, we recommended to proceed  with genetic testing which involved sequencing of the MSH6 gene.  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  This confirms the diagnosis of Lynch syndrome.  SURVEILLANCE:  Because of the increased risk for cancer with Lynch syndrome, we recommend Ms. Bickhart follow the screening recommendations outlined below.   1.  Annual colonoscopy and removal of any visible polyps.  2. Upper endoscopy to screen for stomach and duodenal cancers at intervals determined by the gastroenterologist. 3.  Annual urine cytology. 4.  Although Ms. Lares has already had a TAH/BSO, other women in the family who have not had a TAH/BSO should consider an annual pelvic exam and transvaginal ultrasound of ovaries.  Consideration of serum CA-125, though screening for ovarian cancer has not been shown to be effective. 5.  For women, annual screening for uterine cancer, which can include an ultrasound and/or biopsy.  Alternatively, once finished having children, if planned, women may choose to have a total hysterectomy with removal of the ovaries to reduce the risk of uterine and ovarian cancers dramatically.   FAMILY MEMBERS:  All of Ms. Coxe' paternal relatives should be made aware of this result because they may be at increased risk of cancer.  Genetic testing in her relatives is critical as it can sort out who in her family is at risk and who is not.  Ms. Watson' children and brothers have a 50% chance to have inherited the same mutation and can thus undergo genetic testing.  Once children reach age 27, they should be offered genetic counseling and testing so that they can use this important information to guide their medical care.  Ms. Quintanilla' son, [name], indicated that he has blood in his stool and had severe abdominal cramps last week.  We offered to have him undergo genetic testing through [employer], as he is uninsured.  However, he declined.  I  also offered to contact Merceda Elks, the GI Navigator, to learn more about the orange card in order to obtain a colonoscopy.  Again, he declined.  I strongly urged him to find a primary care doctor so that he could discuss with them his concerns and issues so that he can be followed.  PLAN: Ms. Kyer has a copy of her genetic test result and was told that all relatives needed a copy of this result in order to get tested.  They were provided a brochure about Lynch syndrome, along with the NCCN guidelines for screening based on MSH6 mutation.  Lastly, we encouraged Ms. Gongora to remain in contact with cancer genetics annually so that we can continuously update the family history and inform her of any changes in cancer genetics and testing that may be  of benefit for this family.   Ms.  OGDEN questions were answered to her satisfaction today. Our contact information was provided should additional questions or concerns arise. Thank you for the referral and allowing [place] to share in the care of your patient.   Christopherjohn Schiele P. Florene Glen, [place], [facility] Certified Genetic Counselor [email or web address] phone: [phone]  The patient was seen for a total of 20 minutes in face-to-face genetic counseling.  This patient was discussed with Drs. Magrinat, Lindi Adie and/or Burr Medico who agrees with the above.    _______________________________________________________________________ For Office Staff:  Number of people involved in session: 2 Was an Intern/ student involved with case: no

## 2014-12-22 NOTE — Telephone Encounter (Signed)
Received call from Webb Silversmith in scheduling that patient states she cannot come in today [place] Per Dr.[name] - patient needs this injection.  Called and spoke with patient and she is agreeable to come back to [facility] for lab and injection appt. She states she has been feeling weak. Asked patient if she has been eating and drinking okay - patient states she is. Only complaint is fatigue and weakness. Asked patient if someone will be driving her back since she is feeling that way - patient states she does have a driver. Told patient to arrive at [facility] at 1pm. Patient agreeable to this.

## 2014-12-22 NOTE — Telephone Encounter (Signed)
Revealed positive genetic testing for MSH6 mutation.  Patient is coming in today for blood work and a "shot".  Scheduled her for genetic counseling, as her son will be coming with her, at 2 PM.

## 2014-12-25 ENCOUNTER — Ambulatory Visit: Payer: Medicare Other | Admitting: Oncology

## 2014-12-25 ENCOUNTER — Other Ambulatory Visit: Payer: Medicare Other

## 2014-12-26 ENCOUNTER — Encounter: Payer: Self-pay | Admitting: Radiation Oncology

## 2014-12-27 ENCOUNTER — Encounter: Payer: Self-pay | Admitting: Radiation Oncology

## 2014-12-27 ENCOUNTER — Ambulatory Visit
Admission: RE | Admit: 2014-12-27 | Discharge: 2014-12-27 | Disposition: A | Discharge status: Home / Self Care | Payer: Medicare Other | Source: Ambulatory Visit | Attending: Radiation Oncology | Admitting: Radiation Oncology

## 2014-12-27 VITALS — BP 113/62 | HR 94 | Temp 98.8°F | Resp 16 | Ht 63.0 in | Wt 140.7 lb

## 2014-12-27 DIAGNOSIS — C50919 Malignant neoplasm of unspecified site of unspecified female breast: Secondary | ICD-10-CM | POA: Insufficient documentation

## 2014-12-27 DIAGNOSIS — C541 Malignant neoplasm of endometrium: Secondary | ICD-10-CM

## 2014-12-27 DIAGNOSIS — F4323 Adjustment disorder with mixed anxiety and depressed mood: Secondary | ICD-10-CM

## 2014-12-27 NOTE — Progress Notes (Signed)
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 female who is seen out courtesy of Dr. Marko Plume for an opinion concerning radiation therapy as part of management of patient's recently diagnosed endometrial cancer.. Last year the patient presented with vaginal bleeding. She was seen by Dr. Ellouise Newer in [place] and endometrial biopsy revealed FIGO grade 2 endometrial adenocarcinoma. In addition immuno stains supported possible serous papillary component. Patient was referred to Dr. Denman [place] and ultimately underwent surgery by Dr. Alycia Rossetti at [facility] on 09/15/2014. The patient underwent a robotic assisted total laparoscopic hysterectomy, BSO, bilateral pelvic and periaortic lymphadenectomy. 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 The serous component was FIGO grade 3. The tumor measures 6.5 x 2.8 x 0.87 cm. The tumor penetrated 2 mm in a wall thickness of 2.2 cm. A total of 11 lymph nodes were recovered none of which showed metastasis. The patient's case was presented at the multidisciplinary gynecological oncology conference in December 2015 at [facility]. It was recommended patient proceed with adjuvant therapy consisting of carboplatinum and Taxol and vaginal brachii therapy. Patient is close to finishing her adjuvant chemotherapy and is now referred to radiation oncology for consideration for treatment.  PREVIOUS RADIATION THERAPY: No  PAST MEDICAL HISTORY:  has a past medical history of Anxiety; Depression; Complication of anesthesia; Breast cancer; Cancer (2012); Uterine cancer; Family  history of colon cancer; Family history of brain cancer; and Lynch syndrome.    PAST SURGICAL HISTORY: Past Surgical History  Procedure Laterality Date  . Breast surgery  2012    bilat mastectomy-rt snbx  . Tubal ligation    . Cesarean section      x2  . Multiple tooth extractions    . Breast reconstruction  11/20/2011    Procedure: BREAST RECONSTRUCTION;  Surgeon: Theodoro Kos, DO;  Location: [place];  Service: Plastics;  Laterality: Bilateral;  bilateral implant exchange for breast reconstruction and capsulectomy  . Robotic assisted laparoscopic hysterectomy and salpingectomy  09/15/14    at [facility] by Dr. Rogelio Seen  . Pelvic and para-aortic lymph node dissection  09/15/14    FAMILY HISTORY: family history includes Brain cancer (age of onset: 29) in her paternal uncle; Cancer in her paternal grandmother; Colon cancer (age of onset: 81) in her father.  SOCIAL HISTORY:  reports that she has been smoking Cigarettes.  She has a 14 pack-year smoking history. She has never used smokeless tobacco. She reports that she drinks alcohol. She reports that she does not use illicit drugs.  ALLERGIES: Tetracyclines & related  MEDICATIONS:  Current Outpatient Prescriptions  Medication Sig Dispense Refill  . acyclovir ointment (ZOVIRAX) 5 % Apply to fever blister 5 x daily. 30 g 1  . ARIPiprazole (ABILIFY) 20 MG tablet Take 20 mg by mouth daily.    Marland Kitchen aspirin-acetaminophen-caffeine (EXCEDRIN MIGRAINE) 250-250-65 MG per tablet Take 1 tablet by mouth every 6 (six) hours as needed.    . Choline Fenofibrate (FENOFIBRIC ACID) 135 MG CPDR Take 135 mg by mouth daily.     . clonazePAM (KLONOPIN) 2 MG tablet Take 2 mg by mouth 3 (three) times daily.     Marland Kitchen  dexamethasone (DECADRON) 4 MG tablet Take 5 tablets by with food 12 hours and 6 hours before Taxol treatment 10 tablet 0  . docusate sodium (COLACE) 100 MG capsule Take 1 capsule (100 mg total) by mouth 2 (two) times daily. 60 capsule 2  .  ferrous fumarate (HEMOCYTE - 106 MG FE) 325 (106 FE) MG TABS tablet Take 1 tablet (106 mg of iron total) by mouth daily. Take 1 tablet by mouth daily on empty stomach with orange juice. 30 each 0  . folic acid (FOLVITE) 016 MCG tablet Take 400 mcg by mouth daily.    Marland Kitchen ibuprofen (ADVIL,MOTRIN) 600 MG tablet Take 600 mg by mouth every 6 (six) hours as needed for moderate pain.    Marland Kitchen ondansetron (ZOFRAN-ODT) 8 MG disintegrating tablet     . pravastatin (PRAVACHOL) 40 MG tablet Take 40 mg by mouth daily.     . prochlorperazine (COMPAZINE) 10 MG tablet Take 1 tablet (10 mg total) by mouth every 6 (six) hours as needed for nausea or vomiting. 30 tablet 0  . QUEtiapine (SEROQUEL) 100 MG tablet Take 100 mg by mouth at bedtime.    . thiamine (VITAMIN B-1) 100 MG tablet Take 100 mg by mouth daily.    . vitamin B-12 (CYANOCOBALAMIN) 1000 MCG tablet Take 1,000 mcg by mouth daily.    Marland Kitchen FLUoxetine (PROZAC) 40 MG capsule Take 40 mg by mouth daily.      No current facility-administered medications for this encounter.    REVIEW OF SYSTEMS:  A 15 point review of systems is documented in the electronic medical record. This was obtained by the nursing staff. However, I reviewed this with the patient to discuss relevant findings and make appropriate changes.  She denies any pelvic pain vaginal bleeding or discharge. She denies any urination difficulties or bowel complaints. Overall she has tolerated her chemotherapy well   PHYSICAL EXAM:  height is 5\' 3"  (1.6 m) and weight is 140 lb 11.2 oz (63.821 kg). Her oral temperature is 98.8 F (37.1 C). Her blood pressure is 113/62 and her pulse is 94. Her respiration is 16 and oxygen saturation is 100%.   BP 113/62 mmHg  Pulse 94  Temp(Src) 98.8 F (37.1 C) (Oral)  Resp 16  Ht 5\' 3"  (1.6 m)  Wt 140 lb 11.2 oz (63.821 kg)  BMI 24.93 kg/m2  SpO2 100%  General Appearance:    Alert, cooperative, no distress, appears stated age  Head:    Normocephalic, without obvious  abnormality, atraumatic  Eyes:    PERRL, conjunctiva/corneas clear, EOM's intact,       Nose:   Nares normal, septum midline, mucosa normal, no drainage    or sinus tenderness  Throat:   Lips, mucosa, and tongue normal;  gums normal  Neck:   Supple, symmetrical, trachea midline, no adenopathy;    thyroid:  no enlargement/tenderness/nodules; no carotid   bruit or JVD  Back:     Symmetric, no curvature, ROM normal, no CVA tenderness  Lungs:     Clear to auscultation bilaterally, respirations unlabored  Chest Wall:    No tenderness or deformity   Heart:    Regular rate and rhythm, S1 and S2 normal, no murmur, rub   or gallop  Breast Exam:    reconstructed breasts bilaterally. No palpable or visible signs of recurrence   Abdomen:     Soft, non-tender, bowel sounds active all four quadrants,    no masses, no organomegaly, laparotomy scars are well-healed  Genitalia:    deferred until simulation and planning day      Extremities:   Extremities normal, atraumatic, no cyanosis or edema  Pulses:   2+ and symmetric all extremities  Skin:   Skin color, texture, turgor normal, no rashes or lesions  Lymph nodes:   Cervical, supraclavicular, and axillary nodes normal  Neurologic:    normal strength, sensation and reflexes    throughout     ECOG = 0  0 - Asymptomatic (Fully active, able to carry on all predisease activities without restriction)  LABORATORY DATA:  Lab Results  Component Value Date   WBC 3.3* 12/22/2014   HGB 10.5* 12/22/2014   HCT 32.1* 12/22/2014   MCV 87.9 12/22/2014   PLT 153 12/22/2014   NEUTROABS 1.2* 12/22/2014   Lab Results  Component Value Date   NA 142 12/22/2014   K 3.9 12/22/2014   CL 107 12/26/2010   CO2 26 12/22/2014   GLUCOSE 106 12/22/2014   CREATININE 0.8 12/22/2014   CALCIUM 9.3 12/22/2014      RADIOGRAPHY: No results found.    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  The patient would be at risk  for vaginal cuff recurrence given her serous carcinoma component. I would recommend vaginal brachytherapy as part of her overall management. Given the early stage disease with lack of lymph node metastasis,  I would not recommend pelvic radiation therapy as part of her management especially since she has received adjuvant chemotherapy.  I discussed the treatment course side effects and potential toxicities of radiation therapy in this situation with the patient and her daughter. The patient appears to understand and wishes to proceed with planned course of treatment.  PLAN: Simulation and planning in the near future once the patient's adjuvant chemotherapy is complete. Anticipate 5 intracavitary brachytherapy treatments directed at the proximal vagina.  I spent 60 minutes minutes face to face with the patient and more than 50% of that time was spent in counseling and/or coordination of care.   ------------------------------------------------  Blair Promise, PhD, MD

## 2014-12-27 NOTE — Progress Notes (Signed)
See progress note under physician encounter. 

## 2014-12-28 ENCOUNTER — Other Ambulatory Visit: Payer: Self-pay | Admitting: Radiology

## 2014-12-28 ENCOUNTER — Encounter: Payer: Self-pay | Admitting: Genetic Counselor

## 2014-12-28 ENCOUNTER — Telehealth: Payer: Self-pay | Admitting: Genetic Counselor

## 2014-12-28 NOTE — Progress Notes (Signed)
HPI: Ms. GOSSETT was previously seen in the [place] [facility] due to a personal history of cancer and concerns regarding a hereditary predisposition to cancer. Please refer to our prior cancer genetics clinic note for more information regarding Ms. Schreiter's medical, social and family histories, and our assessment and recommendations, at the time. Ms. MIKRUT recent genetic test results were disclosed to her, as were recommendations warranted by these results. These results and recommendations are discussed in more detail below.  GENETIC TEST RESULTS: At the time of Ms. Dzikowski's visit, we recommended she pursue genetic testing of the BreastNext gene panel. Ms. 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  Please see previous genetic note regarding her genetic counseling on her positive MSH6 mutation causing Lynch syndrome. The BreastNext gene panel offered by [facility] and includes sequencing and rearrangement analysis for the following 17 genes: ATM, BARD1, BRCA1, BRCA2, BRIP1, CDH1, CHEK2, MRE11A, MUTYH, NBN, NF1, PALB2, PTEN, RAD50, RAD51C, RAD51D, and TP53.  The report date is December 25, 2014.  Testing was performed at [facility]. Genetic testing was normal, and did not reveal a deleterious mutation in these genes. 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. Both test reports have been scanned into EPIC and is located under the Media tab.   We discussed with Ms. Mahoney that since the current genetic testing is not perfect, it is possible there may be a gene mutation in one of these genes that current testing cannot detect, but that chance is small. We also discussed, that it is possible that another gene that has not yet been discovered, or that we have not yet tested, is responsible for the cancer diagnoses in the  family, and it is, therefore, important to remain in touch with cancer genetics in the future so that we can continue to offer Ms. Cicio the most up to date genetic testing.   CANCER SCREENING RECOMMENDATIONS: This result is reassuring and suggests that Ms. Pomeroy's endometrial cancer and possibly breast cancer was most likely due to an inherited predisposition associated with an MSH6 mutation. Ms. Delio should be screened according to NCCN guidelines regarding colon cancer screening.    RECOMMENDATIONS FOR FAMILY MEMBERS: All of Ms. Berhe' paternal relatives should be made aware of this result because they may be at increased risk of cancer. Genetic testing in her relatives is critical as it can sort out who in her family is at risk and who is not. Ms. Gallery' children and brothers have a 50% chance to have inherited the same mutation and can thus undergo genetic testing. Once children reach age 36, they should be offered genetic counseling and testing so that they can use this important information to guide their medical care.  FOLLOW-UP: Lastly, we discussed with Ms. Palomino that cancer genetics is a rapidly advancing field and it is possible that new genetic tests will be appropriate for her and/or her family members in the future. We encouraged her to remain in contact with cancer genetics on an annual basis so we can update her personal and family histories and let her know of advances in cancer genetics that may benefit this family.   Our contact number was provided. Ms. WINIARSKI questions were answered to her satisfaction, and she knows she is welcome to call us at anytime with additional questions or concerns.  Roma Kayser, MS, [facility] Certified Genetic Counselor [email or web address]

## 2014-12-28 NOTE — Telephone Encounter (Signed)
Revealed negative genetic testing on BreastNExt panel testing.  Discussed that MSH6 mutation may be implicated for her early onset breast cancer.  She asked if her daughters were at risk for breast cancer, and we discussed that while the breastNExt panel did not indicate an additional gene mutation that increases the risk for breast cancer, her MSH6 mutation may increase the risk for breast cancer in her daughters who test positive.  Also discussed my concern for her son [name], who reported rectal bleeding and severe abdominal cramping.  Discussed that he needs to see a doctor about these symptoms, even if he does not under go genetic testing.

## 2014-12-29 ENCOUNTER — Encounter: Payer: Self-pay | Admitting: *Deleted

## 2014-12-29 ENCOUNTER — Other Ambulatory Visit: Payer: Self-pay | Admitting: Radiology

## 2014-12-29 NOTE — Progress Notes (Signed)
[facility] Psychosocial Distress Screening Clinical Social Work  Clinical Social Work was referred by distress screening protocol.  The patient scored a 7 on the Psychosocial Distress Thermometer which indicates moderate distress. Clinical Social Worker contacted patient by phone to assess for distress and other psychosocial needs. Ms. Krinke shared she is doing well with chemotherapy treatment and plans to start radiation once chemotherapy is done.  She shared she has struggled with depression and anxiety for a long time but currently received the support/treatment she feels she needs.  CSW encouraged patient to call CSW if she has any questions or concerns.  ONCBCN DISTRESS SCREENING 12/27/2014  Screening Type Initial Screening  Distress experienced in past week (1-10) 7  Practical problem type   Family Problem type   Emotional problem type Depression;Nervousness/Anxiety;Boredom;Adjusting to appearance changes  Spiritual/Religous concerns type   Information Concerns Type   Physical Problem type Pain;Sleep/insomnia;Loss of appetitie;Tingling hands/feet  Physician notified of physical symptoms Yes  Referral to clinical psychology No  Referral to clinical social work Yes  Referral to dietition No  Referral to financial advocate No  Referral to support programs No  Referral to palliative care No    Clinical Social Worker follow up needed: No.  If yes, follow up plan:    Polo Riley, MSW, LCSW, OSW-C Clinical Social Worker [place] [phone]

## 2014-12-31 ENCOUNTER — Other Ambulatory Visit: Payer: Self-pay | Admitting: Oncology

## 2014-12-31 DIAGNOSIS — C541 Malignant neoplasm of endometrium: Secondary | ICD-10-CM

## 2015-01-01 ENCOUNTER — Encounter (HOSPITAL_COMMUNITY): Payer: Self-pay

## 2015-01-01 ENCOUNTER — Ambulatory Visit (HOSPITAL_COMMUNITY)
Admission: RE | Admit: 2015-01-01 | Discharge: 2015-01-01 | Disposition: A | Discharge status: Home / Self Care | Payer: Medicare Other | Source: Ambulatory Visit | Attending: Diagnostic Radiology | Admitting: Diagnostic Radiology

## 2015-01-01 ENCOUNTER — Other Ambulatory Visit: Payer: Self-pay | Admitting: Oncology

## 2015-01-01 ENCOUNTER — Other Ambulatory Visit: Payer: Self-pay

## 2015-01-01 ENCOUNTER — Ambulatory Visit (HOSPITAL_COMMUNITY)
Admission: RE | Admit: 2015-01-01 | Discharge: 2015-01-01 | Disposition: A | Discharge status: Home / Self Care | Payer: Medicare Other | Source: Ambulatory Visit | Attending: Oncology | Admitting: Oncology

## 2015-01-01 DIAGNOSIS — C541 Malignant neoplasm of endometrium: Secondary | ICD-10-CM | POA: Diagnosis not present

## 2015-01-01 DIAGNOSIS — F419 Anxiety disorder, unspecified: Secondary | ICD-10-CM | POA: Diagnosis not present

## 2015-01-01 DIAGNOSIS — F329 Major depressive disorder, single episode, unspecified: Secondary | ICD-10-CM | POA: Diagnosis not present

## 2015-01-01 DIAGNOSIS — Z9013 Acquired absence of bilateral breasts and nipples: Secondary | ICD-10-CM | POA: Insufficient documentation

## 2015-01-01 DIAGNOSIS — F17210 Nicotine dependence, cigarettes, uncomplicated: Secondary | ICD-10-CM | POA: Diagnosis not present

## 2015-01-01 DIAGNOSIS — Z79899 Other long term (current) drug therapy: Secondary | ICD-10-CM | POA: Diagnosis not present

## 2015-01-01 DIAGNOSIS — Z90710 Acquired absence of both cervix and uterus: Secondary | ICD-10-CM | POA: Diagnosis not present

## 2015-01-01 DIAGNOSIS — Z853 Personal history of malignant neoplasm of breast: Secondary | ICD-10-CM | POA: Insufficient documentation

## 2015-01-01 LAB — CBC WITH DIFFERENTIAL/PLATELET
Basophils Absolute: 0 10*3/uL (ref 0.0–0.1)
Basophils Relative: 0 % (ref 0–1)
Eosinophils Absolute: 0.1 10*3/uL (ref 0.0–0.7)
Eosinophils Relative: 1 % (ref 0–5)
HEMATOCRIT: 35.1 % — AB (ref 36.0–46.0)
HEMOGLOBIN: 11.3 g/dL — AB (ref 12.0–15.0)
Lymphocytes Relative: 23 % (ref 12–46)
Lymphs Abs: 2.4 10*3/uL (ref 0.7–4.0)
MCH: 29 pg (ref 26.0–34.0)
MCHC: 32.2 g/dL (ref 30.0–36.0)
MCV: 90 fL (ref 78.0–100.0)
MONO ABS: 0.4 10*3/uL (ref 0.1–1.0)
MONOS PCT: 3 % (ref 3–12)
NEUTROS ABS: 7.7 10*3/uL (ref 1.7–7.7)
Neutrophils Relative %: 73 % (ref 43–77)
Platelets: 167 10*3/uL (ref 150–400)
RBC: 3.9 MIL/uL (ref 3.87–5.11)
RDW: 17 % — ABNORMAL HIGH (ref 11.5–15.5)
WBC: 10.5 10*3/uL (ref 4.0–10.5)

## 2015-01-01 LAB — PROTIME-INR
INR: 0.95 (ref 0.00–1.49)
PROTHROMBIN TIME: 12.8 s (ref 11.6–15.2)

## 2015-01-01 LAB — APTT: aPTT: 26 seconds (ref 24–37)

## 2015-01-01 MED ORDER — LIDOCAINE-EPINEPHRINE 2 %-1:100000 IJ SOLN
INTRAMUSCULAR | Status: DC
Start: 2015-01-01 — End: 2015-01-02
  Filled 2015-01-01: qty 1

## 2015-01-01 MED ORDER — CEFAZOLIN SODIUM-DEXTROSE 2-3 GM-% IV SOLR
INTRAVENOUS | Status: AC
Start: 2015-01-01 — End: 2015-01-01
  Filled 2015-01-01: qty 50

## 2015-01-01 MED ORDER — FENTANYL CITRATE 0.05 MG/ML IJ SOLN
INTRAMUSCULAR | Status: AC
  Filled 2015-01-01: qty 4

## 2015-01-01 MED ORDER — HEPARIN SOD (PORK) LOCK FLUSH 100 UNIT/ML IV SOLN
INTRAVENOUS | Status: AC | PRN
  Administered 2015-01-01: 500 [IU]

## 2015-01-01 MED ORDER — MIDAZOLAM HCL 2 MG/2ML IJ SOLN
INTRAMUSCULAR | Status: AC
  Filled 2015-01-01: qty 6

## 2015-01-01 MED ORDER — FENTANYL CITRATE 0.05 MG/ML IJ SOLN
INTRAMUSCULAR | Status: AC | PRN
  Administered 2015-01-01 (×6): 25 ug via INTRAVENOUS

## 2015-01-01 MED ORDER — DEXAMETHASONE 4 MG PO TABS: ORAL_TABLET | ORAL | Status: DC

## 2015-01-01 MED ORDER — MIDAZOLAM HCL 2 MG/2ML IJ SOLN
INTRAMUSCULAR | Status: AC | PRN
  Administered 2015-01-01 (×6): 1 mg via INTRAVENOUS

## 2015-01-01 MED ORDER — CEFAZOLIN SODIUM-DEXTROSE 2-3 GM-% IV SOLR
2.0000 g | Freq: Once | INTRAVENOUS | Status: AC
  Administered 2015-01-01: 2 g via INTRAVENOUS

## 2015-01-01 MED ORDER — SODIUM CHLORIDE 0.9 % IV SOLN: INTRAVENOUS | Status: DC

## 2015-01-01 MED ORDER — HEPARIN SOD (PORK) LOCK FLUSH 100 UNIT/ML IV SOLN
INTRAVENOUS | Status: AC
  Filled 2015-01-01: qty 5

## 2015-01-01 NOTE — Telephone Encounter (Signed)
Spoke with Laurie Benson. She stated that she needed to be at [facility] at 0800 on Thursday 01-05-15.  She is to take the decadron premed for the Taxol treatment at 2100 3-2 and 0300 on 01-04-15 with food.  Pt. Verbalized understanding.  She stated that she wrote down the times.

## 2015-01-01 NOTE — Discharge Instructions (Signed)

## 2015-01-01 NOTE — Procedures (Signed)
Placement of right jugular port.  Tip at SVC/RA junction.  Ready to use.

## 2015-01-01 NOTE — Sedation Documentation (Signed)
Patient is resting comfortably. Pt prepped and draped.  Dr. Anselm Pancoast paged.

## 2015-01-01 NOTE — H&P (Signed)
Chief Complaint: " I am here for a port."  Referring Physician(s): [name],Lennis P  History of Present Illness: Laurie Benson is a 49 y.o. 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 She has been seen by Dr. Sondra Come on 12/27/2014 and Dr. Marko Plume on 12/11/2014 and has underwent some chemotherapy with poor venous access she is now scheduled today for image guided port a catheter placement. She denies any chest pain, shortness of breath or palpitations. She denies any active signs of bleeding or excessive bruising. She denies any recent fever or chills. The patient denies any history of sleep apnea or chronic oxygen use. She has previously tolerated sedation without complications.   Past Medical History  Diagnosis Date  . Anxiety   . Depression   . Complication of anesthesia     heard talking during teeth extraction  . Breast cancer   . Cancer 2012    bilat br ca  . Uterine cancer   . Family history of colon cancer   . Family history of brain cancer   . Lynch syndrome     MSH6 mutation    Past Surgical History  Procedure Laterality Date  . Breast surgery  2012    bilat mastectomy-rt snbx  . Tubal ligation    . Cesarean section      x2  . Multiple tooth extractions    . Breast reconstruction  11/20/2011    Procedure: BREAST RECONSTRUCTION;  Surgeon: Theodoro Kos, DO;  Location: [place];  Service: Plastics;  Laterality: Bilateral;  bilateral implant exchange for breast reconstruction and capsulectomy  . Robotic assisted laparoscopic hysterectomy and salpingectomy  09/15/14    at [facility] by Dr. Rogelio Seen  . Pelvic and para-aortic lymph node dissection  09/15/14    Allergies: Tetracyclines & related  Medications: Prior to Admission medications   Medication Sig Start Date End Date Taking? Authorizing Provider  acyclovir ointment (ZOVIRAX) 5 % Apply to fever blister 5 x daily. 10/11/14  Yes Lennis Marion Downer, MD  ARIPiprazole (ABILIFY) 20 MG tablet Take 20 mg by mouth daily.   Yes Historical Provider, MD  aspirin-acetaminophen-caffeine (EXCEDRIN MIGRAINE) [phone] MG per tablet Take 1 tablet by mouth every 6 (six) hours as needed.   Yes Historical Provider, MD  Choline Fenofibrate (FENOFIBRIC ACID) 135 MG CPDR Take 135 mg by mouth daily.  08/15/14  Yes Historical Provider, MD  clonazePAM (KLONOPIN) 2 MG tablet Take 2 mg by mouth 3 (three) times daily.    Yes Historical Provider, MD  dexamethasone (DECADRON) 4 MG tablet Take 5 tablets by with food 12 hours and 6 hours before Taxol treatment 12/11/14  Yes Lennis Marion Downer, MD  docusate sodium (COLACE) 100 MG capsule Take 1 capsule (100 mg total) by mouth 2 (two) times daily. 10/11/14  Yes Lennis Marion Downer, MD  ferrous fumarate (HEMOCYTE - 106 MG FE) 325 (106 FE) MG TABS tablet Take 1 tablet (106 mg of iron total) by mouth daily. Take 1 tablet by mouth daily on empty stomach with orange juice. 12/13/14  Yes Lennis Marion Downer, MD  folic acid (FOLVITE) 315 MCG tablet Take 400 mcg by mouth daily. 08/23/14  Yes Historical Provider, MD  ibuprofen (ADVIL,MOTRIN) 600 MG tablet Take 600 mg by mouth every 6 (six) hours as needed for moderate pain.   Yes Historical Provider, MD  ondansetron (ZOFRAN-ODT) 8 MG disintegrating tablet Take 8 mg by mouth. Takes one before and  one after chemo 10/11/14  Yes Historical Provider, MD  pravastatin (PRAVACHOL) 40 MG tablet Take 40 mg by mouth daily.  06/29/14  Yes Historical Provider, MD  prochlorperazine (COMPAZINE) 10 MG tablet Take 1 tablet (10 mg total) by mouth every 6 (six) hours as needed for nausea or vomiting. 10/19/14  Yes Lennis P [name], MD  QUEtiapine (SEROQUEL) 100 MG tablet Take 100 mg by mouth at bedtime.   Yes Historical Provider, MD  thiamine (VITAMIN B-1) 100 MG tablet Take 100 mg by mouth daily. 08/23/14  Yes Historical Provider, MD  vitamin B-12 (CYANOCOBALAMIN) 1000 MCG tablet Take 1,000 mcg by mouth  daily. 08/23/14  Yes Historical Provider, MD     Family History  Problem Relation Age of Onset  . Colon cancer Father 44  . Brain cancer Paternal Uncle 43  . Cancer Paternal Grandmother     cancer of her "eyes"    History   Social History  . Marital Status: Legally Separated    Spouse Name: N/A  . Number of Children: 6  . Years of Education: N/A   Social History Main Topics  . Smoking status: Heavy Tobacco Smoker -- 1.00 packs/day for 14 years    Types: Cigarettes  . Smokeless tobacco: Never Used  . Alcohol Use: Yes  . Drug Use: No  . Sexual Activity: Not Currently   Other Topics Concern  . None   Social History Narrative   Review of Systems: A 12 point ROS discussed and pertinent positives are indicated in the HPI above.  All other systems are negative.  Review of Systems  Vital Signs: T: 98.33F, HR: 103bpm, BP: 133/24mHg, O2: 100%RA  Physical Exam  Constitutional: She is oriented to person, place, and time. No distress.  HENT:  Head: Normocephalic and atraumatic.  Neck: No tracheal deviation present.  Cardiovascular: Regular rhythm.  Exam reveals no gallop and no friction rub.   No murmur heard. Tachycardic  Pulmonary/Chest: Effort normal and breath sounds normal. No respiratory distress. She has no wheezes. She has no rales.  Abdominal: Soft. Bowel sounds are normal. She exhibits no distension. There is no tenderness.  Neurological: She is alert and oriented to person, place, and time.  Skin: She is not diaphoretic.  Psychiatric: She has a normal mood and affect. Her behavior is normal. Thought content normal.    Mallampati Score:  MD Evaluation Airway: WNL Heart: WNL Abdomen: WNL Chest/ Lungs: WNL ASA  Classification: 3 Mallampati/Airway Score: Two  Imaging: No results found.  Labs:  CBC:  Recent Labs  12/11/14 1533 12/14/14 1000 12/22/14 1355 01/01/15 1215  WBC 5.1 4.8 3.3* 10.5  HGB 10.8* 11.7 10.5* 11.3*  HCT 33.7* 35.9 32.1* 35.1*   PLT 267 260 153 167    COAGS:  Recent Labs  01/01/15 1215  INR 0.95  APTT 26    BMP:  Recent Labs  11/29/14 1348 12/11/14 1534 12/14/14 1001 12/22/14 1356  NA 141 145 145 142  K 3.8 4.0 3.8 3.9  CO2 20* 26 23 26   GLUCOSE 111 102 175* 106  BUN 14.4 4.3* 10.7 8.8  CALCIUM 9.5 9.1 9.4 9.3  CREATININE 0.8 0.9 1.0 0.8    LIVER FUNCTION TESTS:  Recent Labs  11/29/14 1348 12/11/14 1534 12/14/14 1001 12/22/14 1356  BILITOT 0.82 <0.20 <0.20 0.25  AST 14 13 13  57*  ALT 12 11 10  47  ALKPHOS 54 54 62 66  PROT 7.3 6.4 7.1 6.7  ALBUMIN 4.1 3.6 3.9 3.8  Assessment and Plan: 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 Sondra Come on 12/27/2014, Dr. Marko Plume on 12/11/2014 Scheduled today for image guided port a catheter placement with moderate sedation for chemotherapy Patient has been nothing by mouth, no blood thinners taken, afebrile, labs reviewed. Risks and Benefits discussed with the patient. All of the patient's questions were answered, patient is agreeable to proceed. Consent signed and in chart.   Thank you for this interesting consult.  I greatly enjoyed meeting Laurie Benson and look forward to participating in their care.  SignedHedy Jacob 01/01/2015, 2:11 PM   I spent a total of 20 Minutes in face to face in clinical consultation, greater than 50% of which was counseling/coordinating care for endometrial cancer.

## 2015-01-01 NOTE — Progress Notes (Signed)
Patient in short stay prior to procedure in interventional radiology. Patient stated had chest pain 12/25/14 which was three days post neupogen injection (12/22/14). She went to [facility] and was kept overnight for observation. Stress test done and negative at that time.  No chest pain at this time. Informed Dr. Anselm Pancoast and Tsosie Billing PA about above.

## 2015-01-02 ENCOUNTER — Telehealth: Payer: Self-pay | Admitting: Radiology

## 2015-01-02 ENCOUNTER — Other Ambulatory Visit: Payer: Self-pay | Admitting: *Deleted

## 2015-01-02 ENCOUNTER — Telehealth: Payer: Self-pay | Admitting: *Deleted

## 2015-01-02 DIAGNOSIS — C541 Malignant neoplasm of endometrium: Secondary | ICD-10-CM

## 2015-01-02 MED ORDER — LIDOCAINE-PRILOCAINE 2.5-2.5 % EX CREA: 1.0000 "application " | TOPICAL_CREAM | CUTANEOUS | Status: DC | PRN

## 2015-01-02 NOTE — Telephone Encounter (Signed)
EMLA yes    thanks

## 2015-01-02 NOTE — Telephone Encounter (Signed)
Pt called today regarding pain at rt IJ port site. Port was placed 2/29 at [facility] without immediate complications. Pt states she has taken excedrin and ibuprofen with minimal relief. She denies fever, drainage or erythema at site. She has been unable to sleep comfortably secondary to pain. Case d/w Dr. Annamaria Boots. Will give pt prescription for vicodin 5/325  # 20 no refills- pt to take 1-2 tablets every 4-6 hrs as needed and contact us if symptoms do not improve.

## 2015-01-02 NOTE — Telephone Encounter (Signed)
MAY EMLA CREAM PRESCRIPTION BE SENT TO PT.'S PHARMACY?

## 2015-01-04 ENCOUNTER — Ambulatory Visit (HOSPITAL_BASED_OUTPATIENT_CLINIC_OR_DEPARTMENT_OTHER): Payer: Medicare Other

## 2015-01-04 ENCOUNTER — Other Ambulatory Visit (HOSPITAL_BASED_OUTPATIENT_CLINIC_OR_DEPARTMENT_OTHER): Payer: Medicare Other

## 2015-01-04 ENCOUNTER — Telehealth: Payer: Self-pay | Admitting: *Deleted

## 2015-01-04 ENCOUNTER — Other Ambulatory Visit: Payer: Medicare Other

## 2015-01-04 ENCOUNTER — Encounter: Payer: Self-pay | Admitting: Oncology

## 2015-01-04 ENCOUNTER — Ambulatory Visit: Payer: Medicare Other

## 2015-01-04 ENCOUNTER — Ambulatory Visit (HOSPITAL_BASED_OUTPATIENT_CLINIC_OR_DEPARTMENT_OTHER): Payer: Medicare Other | Admitting: Oncology

## 2015-01-04 ENCOUNTER — Telehealth: Payer: Self-pay | Admitting: Oncology

## 2015-01-04 VITALS — BP 114/55 | HR 108 | Temp 98.7°F | Resp 18 | Ht 63.0 in | Wt 145.6 lb

## 2015-01-04 DIAGNOSIS — Z95828 Presence of other vascular implants and grafts: Secondary | ICD-10-CM

## 2015-01-04 DIAGNOSIS — T451X5A Adverse effect of antineoplastic and immunosuppressive drugs, initial encounter: Secondary | ICD-10-CM

## 2015-01-04 DIAGNOSIS — F3181 Bipolar II disorder: Secondary | ICD-10-CM

## 2015-01-04 DIAGNOSIS — D0511 Intraductal carcinoma in situ of right breast: Secondary | ICD-10-CM

## 2015-01-04 DIAGNOSIS — D72829 Elevated white blood cell count, unspecified: Secondary | ICD-10-CM

## 2015-01-04 DIAGNOSIS — D701 Agranulocytosis secondary to cancer chemotherapy: Secondary | ICD-10-CM

## 2015-01-04 DIAGNOSIS — Z720 Tobacco use: Secondary | ICD-10-CM

## 2015-01-04 DIAGNOSIS — Q254 Congenital malformation of aorta unspecified: Secondary | ICD-10-CM

## 2015-01-04 DIAGNOSIS — Z550 Illiteracy and low-level literacy: Secondary | ICD-10-CM

## 2015-01-04 DIAGNOSIS — Z853 Personal history of malignant neoplasm of breast: Secondary | ICD-10-CM

## 2015-01-04 DIAGNOSIS — D6481 Anemia due to antineoplastic chemotherapy: Secondary | ICD-10-CM

## 2015-01-04 DIAGNOSIS — Z5111 Encounter for antineoplastic chemotherapy: Secondary | ICD-10-CM

## 2015-01-04 DIAGNOSIS — Z1509 Genetic susceptibility to other malignant neoplasm: Secondary | ICD-10-CM

## 2015-01-04 DIAGNOSIS — C541 Malignant neoplasm of endometrium: Secondary | ICD-10-CM

## 2015-01-04 DIAGNOSIS — K635 Polyp of colon: Secondary | ICD-10-CM

## 2015-01-04 LAB — COMPREHENSIVE METABOLIC PANEL (CC13)
ALBUMIN: 3.6 g/dL (ref 3.5–5.0)
ALK PHOS: 111 U/L (ref 40–150)
ALT: 69 U/L — ABNORMAL HIGH (ref 0–55)
AST: 92 U/L — AB (ref 5–34)
Anion Gap: 11 mEq/L (ref 3–11)
BUN: 14.1 mg/dL (ref 7.0–26.0)
CO2: 24 mEq/L (ref 22–29)
CREATININE: 0.8 mg/dL (ref 0.6–1.1)
Calcium: 9.6 mg/dL (ref 8.4–10.4)
Chloride: 110 mEq/L — ABNORMAL HIGH (ref 98–109)
EGFR: 80 mL/min/{1.73_m2} — AB (ref >90)
Glucose: 181 mg/dl — ABNORMAL HIGH (ref 70–140)
POTASSIUM: 4.5 meq/L (ref 3.5–5.1)
Sodium: 145 mEq/L (ref 136–145)
Total Protein: 6.8 g/dL (ref 6.4–8.3)

## 2015-01-04 LAB — CBC WITH DIFFERENTIAL/PLATELET
BASO%: 0 % (ref 0.0–2.0)
Basophils Absolute: 0 10*3/uL (ref 0.0–0.1)
EOS ABS: 0 10*3/uL (ref 0.0–0.5)
EOS%: 0 % (ref 0.0–7.0)
HCT: 32.5 % — ABNORMAL LOW (ref 34.8–46.6)
HEMOGLOBIN: 10.6 g/dL — AB (ref 11.6–15.9)
LYMPH#: 0.5 10*3/uL — AB (ref 0.9–3.3)
LYMPH%: 7.7 % — ABNORMAL LOW (ref 14.0–49.7)
MCH: 29.4 pg (ref 25.1–34.0)
MCHC: 32.6 g/dL (ref 31.5–36.0)
MCV: 90 fL (ref 79.5–101.0)
MONO#: 0 10*3/uL — AB (ref 0.1–0.9)
MONO%: 0.6 % (ref 0.0–14.0)
NEUT%: 91.7 % — AB (ref 38.4–76.8)
NEUTROS ABS: 6.2 10*3/uL (ref 1.5–6.5)
PLATELETS: 235 10*3/uL (ref 145–400)
RBC: 3.61 10*6/uL — AB (ref 3.70–5.45)
RDW: 16.8 % — ABNORMAL HIGH (ref 11.2–14.5)
WBC: 6.8 10*3/uL (ref 3.9–10.3)

## 2015-01-04 MED ORDER — FAMOTIDINE IN NACL 20-0.9 MG/50ML-% IV SOLN
20.0000 mg | Freq: Once | INTRAVENOUS | Status: AC
  Administered 2015-01-04: 20 mg via INTRAVENOUS

## 2015-01-04 MED ORDER — SODIUM CHLORIDE 0.9 % IV SOLN
Freq: Once | INTRAVENOUS | Status: AC
  Administered 2015-01-04: 10:00:00 via INTRAVENOUS

## 2015-01-04 MED ORDER — PROCHLORPERAZINE EDISYLATE 5 MG/ML IJ SOLN
10.0000 mg | Freq: Once | INTRAMUSCULAR | Status: AC
Start: 2015-01-04 — End: 2015-01-04
  Administered 2015-01-04: 10 mg via INTRAVENOUS

## 2015-01-04 MED ORDER — SODIUM CHLORIDE 0.9 % IV SOLN
450.0000 mg | Freq: Once | INTRAVENOUS | Status: AC
  Administered 2015-01-04: 450 mg via INTRAVENOUS
  Filled 2015-01-04: qty 45

## 2015-01-04 MED ORDER — PROCHLORPERAZINE EDISYLATE 5 MG/ML IJ SOLN
INTRAMUSCULAR | Status: AC
  Filled 2015-01-04: qty 2

## 2015-01-04 MED ORDER — HEPARIN SOD (PORK) LOCK FLUSH 100 UNIT/ML IV SOLN
500.0000 [IU] | Freq: Once | INTRAVENOUS | Status: AC | PRN
  Administered 2015-01-04: 500 [IU]
  Filled 2015-01-04: qty 5

## 2015-01-04 MED ORDER — DIPHENHYDRAMINE HCL 50 MG/ML IJ SOLN
INTRAMUSCULAR | Status: AC
  Filled 2015-01-04: qty 1

## 2015-01-04 MED ORDER — SODIUM CHLORIDE 0.9 % IV SOLN
150.0000 mg | Freq: Once | INTRAVENOUS | Status: AC
  Administered 2015-01-04: 150 mg via INTRAVENOUS
  Filled 2015-01-04: qty 5

## 2015-01-04 MED ORDER — DIPHENHYDRAMINE HCL 50 MG/ML IJ SOLN
50.0000 mg | Freq: Once | INTRAMUSCULAR | Status: AC
  Administered 2015-01-04: 50 mg via INTRAVENOUS

## 2015-01-04 MED ORDER — DEXAMETHASONE SODIUM PHOSPHATE 20 MG/5ML IJ SOLN
INTRAMUSCULAR | Status: AC
  Filled 2015-01-04: qty 5

## 2015-01-04 MED ORDER — SODIUM CHLORIDE 0.9 % IJ SOLN
10.0000 mL | INTRAMUSCULAR | Status: DC | PRN
  Administered 2015-01-04: 10 mL
  Filled 2015-01-04: qty 10

## 2015-01-04 MED ORDER — PACLITAXEL CHEMO INJECTION 300 MG/50ML
135.0000 mg/m2 | Freq: Once | INTRAVENOUS | Status: AC
  Administered 2015-01-04: 222 mg via INTRAVENOUS
  Filled 2015-01-04: qty 37

## 2015-01-04 MED ORDER — FAMOTIDINE IN NACL 20-0.9 MG/50ML-% IV SOLN
INTRAVENOUS | Status: AC
  Filled 2015-01-04: qty 50

## 2015-01-04 MED ORDER — DEXAMETHASONE SODIUM PHOSPHATE 20 MG/5ML IJ SOLN
12.0000 mg | Freq: Once | INTRAMUSCULAR | Status: AC
  Administered 2015-01-04: 12 mg via INTRAVENOUS

## 2015-01-04 NOTE — Progress Notes (Signed)
VO per phone given and read back - Dr. Marko Plume.  OK to treat with AST of 92 (taxol and carbo).

## 2015-01-04 NOTE — Patient Instructions (Signed)
[facility] Discharge Instructions for Patients Receiving Chemotherapy  Today you received the following chemotherapy agents: Taxol and Carboplatin.  To help prevent nausea and vomiting after your treatment, we encourage you to take your nausea medication as prescribed.   If you develop nausea and vomiting that is not controlled by your nausea medication, call the clinic.   BELOW ARE SYMPTOMS THAT SHOULD BE REPORTED IMMEDIATELY:  *FEVER GREATER THAN 100.5 F  *CHILLS WITH OR WITHOUT FEVER  NAUSEA AND VOMITING THAT IS NOT CONTROLLED WITH YOUR NAUSEA MEDICATION  *UNUSUAL SHORTNESS OF BREATH  *UNUSUAL BRUISING OR BLEEDING  TENDERNESS IN MOUTH AND THROAT WITH OR WITHOUT PRESENCE OF ULCERS  *URINARY PROBLEMS  *BOWEL PROBLEMS  UNUSUAL RASH Items with * indicate a potential emergency and should be followed up as soon as possible.  Feel free to call the clinic you have any questions or concerns. The clinic phone number is [phone].    

## 2015-01-04 NOTE — Telephone Encounter (Signed)
Received records and scan results from [facility] emergency room visit on 12/24/14. Copy given to Dr. Marko Plume and original records sent to HIM to be scanned into patient's chart.

## 2015-01-04 NOTE — Telephone Encounter (Signed)
appts made and avs printed for pt °

## 2015-01-04 NOTE — Progress Notes (Signed)
OFFICE PROGRESS NOTE   January 04, 2015   Physicians:Paola Gehrig/ [name], Gerald Stabs RIchardson (gyn [place]), Erroll Luna, Louretta Parma [place], PCP [facility], Dr Melina Copa (GI in [place]), Kennyth Lose (psychiatry, [facility] in [place]), Gery Pray   Information from [place] ED including labs and CT reports reviewed by MD when available following this visit.  INTERVAL HISTORY:  Patient is seen, together with son, in continuing attention to adjuvant chemotherapy in process for IA serous/ endometioid carcinoma of uterus, due cycle 4 carboplatin taxol today. I saw her last on 12-11-14, as she missed another appointment on 12-21-14.  After being contacted by RN, patient did come for neulasta on 12-22-14.She had severe aches including sternum and back beginning day after neulasta, and tells [place] now that she was seen in [place] ED on 12-24-14 due to that pain, which improved with interventions in ED. Per patient and son, she had CT in ED which had something of concern with the liver. We have requested ED information now, however this was not available at time of visit.  She had consultation with Dr Sondra Come on 12-27-14, plan for brachytherapy to follow completion of chemotherapy.  Patient also met with genetics counselor on 12-22-14 as work in, as genetics testing has confirmed Lynch syndrome in her, this based on MSH6 by Cephus Shelling , with rest of Breast Next panel unremarkable. She is able to tell me today that GI physician in [place] is Dr Melina Copa, who has been doing yearly colonoscopies, due upcoming. I will let him know this information re Lynch testing. I have mentioned that her children and siblings should also be screened, as did Retail buyer; son was with her for meeting with genetics counselor but was reluctant to proceed himself.   Patient has no aching now and is otherwise feeling fairly well today. She has minimal peripheral neuropathy that is not bothersome, mostly tips of  fingers. She has been eating and drinking fluids well. She has some hydrocodone at home, as well as ibuprofen, which would be reasonable to try.  PAC was placed by IR on 01-01-15, still sore but improving.  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 Flu vaccine done Tobacco ongoing, however she is no longer smoking in car and believes that she is now < 1 ppd.     Daughter had baby boy by C section last weekend, Lavone Orn, doing well.  ONCOLOGIC HISTORY  Patient had heavy vaginal bleeding perhaps a year ago, seen by gyn in [place] and recalls that PAP was ok. She continued to have heavy menses and spotting between periods, seen back by Dr Ellouise Newer with ultrasound 07-27-14 which showed uterus 8x5x5 cm and biopsy, with 2 cm endometrial thickness and 2 cm intramural fibroid, normal appearing ovaries. Endometrial biopsy by Dr Marvel Plan 08-17-14 revealed FIGO grade 2 endometrial adenocarcinoma, with immunostains for p53 and p16 focally + supporting possible serous papillary component. She was seen in consultation by Dr Denman George on 08-31-14, with CT scheduled and recommendation for surgery followed possibly by adjuvant therapy. Genetics testing was also recommended, with concern for Lynch syndrome. CT AP 09-04-14 had LLL pulmonary scaring with lung bases otherwise clear, 1.6 cm hypodense lesion inferior posterior right hepatic lobe of unclear etiology, no adenopathy, no free fluid, ovaries normal, inhomogeneity of endometrium. Surgery was at [facility] 09-15-14 by Dr Alycia Rossetti, robotic assisted total laparoscopic hysterectomy, BSO, bilateral pelvic and para-arotic lymphednectomy. Pathology [facility] [phone]) from 09-15-14 found mixed adenocarcinoma ( 70% endometroid  grade 3 and 30% serous grade 2) involving endometrium, with total size 6.5 x 2.8 x 0.8 cm and 2 mm myometrial invasion where wall 2.2 cm thick (9%). There was no involvement of serosa, lower uterine  segment, cervix, adnexa or ovaries. There was no LVSI. Nodes: 3 right periaortic, no identified left periaortic (due to anatomy irregular on left at time of surgery), 5 right pelvic and 3 left pelvic nodes negative. ER + 90%. Pathologic stage IA. Operative note does not describe posterior liver region. Her case was discussed at [place] at [facility] on 10-04-14, with recommendation for adjuvant carboplatin taxol, and vaginal brachytherapy. 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  Patient was DC home day after surgery. She saw Dr Alycia Rossetti on 10-02-14 with small left vaginal cuff separation which was no longer apparent by 10-19-14. First carbo taxol 11-01-14. Lynch syndrome documented by genetics testing sent 11-2014.  Patient also has history of DCIS right breast, post right mastectomy with sentinel node and prophylactic left mastectomy 12-2010, with bilateral reconstructions. No genetic concerns on Breast Next panel also sent 11-2014.  Review of systems as above, also: No fever or symptoms of infection. No bleeding. No abdominal or pelvic pain. Bowels ok. No swelling LE. No increased SOB or chest pain.Remainder of 10 point Review of Systems negative.  Objective:  Vital signs in last 24 hours:  BP 114/55 mmHg  Pulse 108  Temp(Src) 98.7 F (37.1 C) (Oral)  Resp 18  Ht _0  (1.6 m)  Wt 145 lb 9.6 oz (66.044 kg)  BMI 25.80 kg/m2 Weight up 5.5 lbs Alert, oriented and appropriate. Ambulatory without difficulty, easily able to get on and off exam table.Very pleasant as always. Alopecia  HEENT:PERRL, sclerae not icteric. Oral mucosa moist without lesions, posterior pharynx clear.  Neck supple. No JVD.  Lymphatics:no cervical,supraclavicular adenopathy Resp: clear to auscultation bilaterally and normal percussion bilaterally Cardio: regular rate and  rhythm. No gallop. GI: soft, nontender, not distended, no mass or organomegaly. Normally active bowel sounds. Surgical incision not remarkable. Musculoskeletal/ Extremities: without pitting edema, cords, tenderness Neuro: no significant peripheral neuropathy. Otherwise nonfocal. PSYCH appropriate mood and affect Skin without rash, ecchymosis, petechiae Breasts: exam not repeated with son present today PAC site healing well, surgical glue still on supraclavicular incision, position good.  Lab Results:  Results for orders placed or performed in visit on 01/04/15  CBC with Differential  Result Value Ref Range   WBC 6.8 3.9 - 10.3 10e3/uL   NEUT# 6.2 1.5 - 6.5 10e3/uL   HGB 10.6 (L) 11.6 - 15.9 g/dL   HCT 32.5 (L) 34.8 - 46.6 %   Platelets 235 145 - 400 10e3/uL   MCV 90.0 79.5 - 101.0 fL   MCH 29.4 25.1 - 34.0 pg   MCHC 32.6 31.5 - 36.0 g/dL   RBC 3.61 (L) 3.70 - 5.45 10e6/uL   RDW 16.8 (H) 11.2 - 14.5 %   lymph# 0.5 (L) 0.9 - 3.3 10e3/uL   MONO# 0.0 (L) 0.1 - 0.9 10e3/uL   Eosinophils Absolute 0.0 0.0 - 0.5 10e3/uL   Basophils Absolute 0.0 0.0 - 0.1 10e3/uL   NEUT% 91.7 (H) 38.4 - 76.8 %   LYMPH% 7.7 (L) 14.0 - 49.7 %   MONO% 0.6 0.0 - 14.0 %   EOS% 0.0 0.0 - 7.0 %   BASO% 0.0 0.0 - 2.0 %   CMET available  during appointment has glucose 181 on premed decadron, AST 92, ALT 69, AP 111, T bili <0.2, creatinine 0.8.  Studies/Results:  No results found.  Medications: I have reviewed the patient's current medications. OK to try hydrocodone for neulasta aches; also needs to take claritin and ok to use ibuprofen with food.   DISCUSSION: suggested heating pad or hot shower also for aches. Discussed etiology of aches after neulasta; she tells me that she "will not panic" next time.  Discussed in general the Lynch syndrome diagnosis and assured patient and son that this is useful information to be sure that she and any other affected family members are followed carefully. Discussed smoking  cessation again and encouraged her to continue to cut out habit cigarettes; in addition to the car, her hardest times are with coffee. She did not use EML[facility] to [facility] correctly, instructed by RN now.   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  Cardiolite Lexiscan: LVEF 74%, normal LV wall motion, "low risk stress test findings" ED physical exam pertinent for O2 sat 97%, lungs and heart normal EKG reportedly NSR, normal ST Prescriptions given for oxycodone 5 mg #15 and chantix 1 bid #60 LABS:  WBC 6.1, ANC 1.53, Hgb 10.1, plt 176k.  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 I will see her back shortly prior to cycle 5, labs with my visit.  Plan vaginal brachytherapy by Dr Sondra Come after chemo completes. 2.severe bone aches after neulasta cycle 3: evaluated in [facility] ED. Option would be daily neupogen/ granix x several days, which would require trips to [place], vs other  interventions to manage neulasta pain as noted. 3.Lynch syndrome, with MSH6 documented from testing sent 11-2014. I will let Dr Melina Copa know. Continue to encourage testing of family members, which our genetics counseling department is glad to facilitate. Note she has 6 children and ~ 11 grands, 2 children in [place] and others out of state and in [place]. Note family history of colon cancer in father who died age 78, brain cancer paternal uncle in his 55s. 4.colon polyps: already followed with yearly colonoscopies, tho we do not know that she has had genetics testing prior to now. She has only just given me Dr Carmie End name, cc this note to him now. 5.personal hx DCIS right breast 2012, treated with right mastectomy and prophylactic left mastectomy with bilateral reconstruction, no radiation and no hormonal intervention 6.ongoing tobacco, which she has begun to address 7.leukopenia with ANC 1.2 following cycle 3 chemotherapy, with neulasta then complicated by aches severe to point  of ED evaluation. I will ask RN to talk with her about 2-3 doses of granix instead of neulasta after upcoming treatments, as aches may be much less severe with that gCSF. 8.bipolar 2 disorder (hypomanic) with history of PTSD and anxiety, followed by psychiatrist at [facility], on multiple medications, which have limited choice of antiemetics with chemo, but fortunately compazine and EMEND are working well 9. Slight elevation of AST, ALT, AP on chemistries today, bili ok. Per ED note, may be ETOH. 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  All questions answered and patient/ son are comfortable with discussion and plans. They are encouraged to call prior to next scheduled visit if needed. We will be back  in touch to let her know ED information, to reinforce no ETOH and to discuss granix instead of neulasta.   Time spent 60 min including review of outside hospital records, >50% counseling and coordination of care.     [name],LENNIS P, MD   01/04/2015, 8:59 AM

## 2015-01-05 ENCOUNTER — Telehealth: Payer: Self-pay | Admitting: Oncology

## 2015-01-05 NOTE — Telephone Encounter (Signed)
Ret pt call to r/s her inj   anne

## 2015-01-10 ENCOUNTER — Telehealth: Payer: Self-pay | Admitting: *Deleted

## 2015-01-10 NOTE — Telephone Encounter (Signed)
CALLED PATIENT TO INFORM OF MOM'S HDR TX., LVM FOR A RETURN CALL

## 2015-01-10 NOTE — Telephone Encounter (Signed)
Called patient to inform of HDR Case , spoke with her son Laurie Benson and he is aware of these appts.

## 2015-01-10 NOTE — Telephone Encounter (Signed)
CALLED PATIENT'S SON- TRACY HILL PER DR. KINARD REQUEST TO INFORM OF HIS MOM'S HDR CASE, SON IS ASLEEP WILL CALL LATER.

## 2015-01-11 ENCOUNTER — Ambulatory Visit (HOSPITAL_BASED_OUTPATIENT_CLINIC_OR_DEPARTMENT_OTHER): Payer: Medicare Other

## 2015-01-11 ENCOUNTER — Other Ambulatory Visit: Payer: Self-pay | Admitting: Oncology

## 2015-01-11 ENCOUNTER — Other Ambulatory Visit: Payer: Self-pay | Admitting: *Deleted

## 2015-01-11 ENCOUNTER — Telehealth: Payer: Self-pay | Admitting: *Deleted

## 2015-01-11 DIAGNOSIS — C541 Malignant neoplasm of endometrium: Secondary | ICD-10-CM

## 2015-01-11 DIAGNOSIS — Z5189 Encounter for other specified aftercare: Secondary | ICD-10-CM

## 2015-01-11 MED ORDER — PEGFILGRASTIM INJECTION 6 MG/0.6ML ~~LOC~~
6.0000 mg | PREFILLED_SYRINGE | Freq: Once | SUBCUTANEOUS | Status: AC
  Administered 2015-01-11: 6 mg via SUBCUTANEOUS
  Filled 2015-01-11: qty 0.6

## 2015-01-11 MED ORDER — TRAMADOL HCL 50 MG PO TABS: ORAL_TABLET | ORAL | Status: DC

## 2015-01-11 NOTE — Telephone Encounter (Signed)
Patient met in lobby today prior to injection appt. Patient given the option per Dr. Marko Plume to either have Neulasta injection today or having Granix daily for 3 days. Reminded patient that she will probably have more side effects such as bone pain and aches with the Neulasta in comparison to the Granix given daily. Patient states she does not want to come 3 days in a row so she will do the Neulasta injection. Patient requesting something to take for pain at home for several days after Neulasta. Patient states that she has had tramadol in the hospital before and it was effective for her.   Ultram prescription given to patient and patient reminded to try taking Claritin as well the next few days. Patient's son requesting something stronger for his mom stating "I am sick of seeing her hurt. Ultram is what we gave our dog and it doesn't touch pain." Told patient to try taking Claritin and Ultram first and if she is still having bad aches/joint pain to please call [place] and we will reevaluate. Patient agreeable to this.   Note: Patient was given Vicodin 5-325 #20 by IR on 01/02/15 for pain after PAC insertion. Patient states she no longer has any of this medication left.

## 2015-01-11 NOTE — Patient Instructions (Signed)
Pegfilgrastim injection What is this medicine? PEGFILGRASTIM (peg fil GRA stim) is a long-acting granulocyte colony-stimulating factor that stimulates the growth of neutrophils, a type of white blood cell important in the body's fight against infection. It is used to reduce the incidence of fever and infection in patients with certain types of cancer who are receiving chemotherapy that affects the bone marrow. This medicine may be used for other purposes; ask your health care provider or pharmacist if you have questions. COMMON BRAND NAME(S): Neulasta What should I tell my health care provider before I take this medicine? They need to know if you have any of these conditions: -latex allergy -ongoing radiation therapy -sickle cell disease -skin reactions to acrylic adhesives (On-Body Injector only) -an unusual or allergic reaction to pegfilgrastim, filgrastim, other medicines, foods, dyes, or preservatives -pregnant or trying to get pregnant -breast-feeding How should I use this medicine? This medicine is for injection under the skin. If you get this medicine at home, you will be taught how to prepare and give the pre-filled syringe or how to use the On-body Injector. Refer to the patient Instructions for Use for detailed instructions. Use exactly as directed. Take your medicine at regular intervals. Do not take your medicine more often than directed. It is important that you put your used needles and syringes in a special sharps container. Do not put them in a trash can. If you do not have a sharps container, call your pharmacist or healthcare provider to get one. Talk to your pediatrician regarding the use of this medicine in children. Special care may be needed. Overdosage: If you think you have taken too much of this medicine contact a poison control center or emergency room at once. NOTE: This medicine is only for you. Do not share this medicine with others. What if I miss a dose? It is  important not to miss your dose. Call your doctor or health care professional if you miss your dose. If you miss a dose due to an On-body Injector failure or leakage, a new dose should be administered as soon as possible using a single prefilled syringe for manual use. What may interact with this medicine? Interactions have not been studied. Give your health care provider a list of all the medicines, herbs, non-prescription drugs, or dietary supplements you use. Also tell them if you smoke, drink alcohol, or use illegal drugs. Some items may interact with your medicine. This list may not describe all possible interactions. Give your health care provider a list of all the medicines, herbs, non-prescription drugs, or dietary supplements you use. Also tell them if you smoke, drink alcohol, or use illegal drugs. Some items may interact with your medicine. What should I watch for while using this medicine? You may need blood work done while you are taking this medicine. If you are going to need a MRI, CT scan, or other procedure, tell your doctor that you are using this medicine (On-Body Injector only). What side effects may I notice from receiving this medicine? Side effects that you should report to your doctor or health care professional as soon as possible: -allergic reactions like skin rash, itching or hives, swelling of the face, lips, or tongue -dizziness -fever -pain, redness, or irritation at site where injected -pinpoint red spots on the skin -shortness of breath or breathing problems -stomach or side pain, or pain at the shoulder -swelling -tiredness -trouble passing urine Side effects that usually do not require medical attention (report to your doctor   or health care professional if they continue or are bothersome): -bone pain -muscle pain This list may not describe all possible side effects. Call your doctor for medical advice about side effects. You may report side effects to FDA at  1-800-FDA-1088. Where should I keep my medicine? Keep out of the reach of children. Store pre-filled syringes in a refrigerator between 2 and 8 degrees C (36 and 46 degrees F). Do not freeze. Keep in carton to protect from light. Throw away this medicine if it is left out of the refrigerator for more than 48 hours. Throw away any unused medicine after the expiration date. NOTE: This sheet is a summary. It may not cover all possible information. If you have questions about this medicine, talk to your doctor, pharmacist, or health care provider.  2015, Elsevier/[employer] Standard. (2014-01-19 16:14:05)  

## 2015-01-12 ENCOUNTER — Encounter: Payer: Self-pay | Admitting: Gynecologic Oncology

## 2015-01-12 ENCOUNTER — Ambulatory Visit: Payer: Medicare Other

## 2015-01-15 ENCOUNTER — Telehealth: Payer: Self-pay | Admitting: *Deleted

## 2015-01-15 ENCOUNTER — Other Ambulatory Visit: Payer: Self-pay | Admitting: *Deleted

## 2015-01-15 DIAGNOSIS — K1230 Oral mucositis (ulcerative), unspecified: Secondary | ICD-10-CM

## 2015-01-15 DIAGNOSIS — C541 Malignant neoplasm of endometrium: Secondary | ICD-10-CM

## 2015-01-15 MED ORDER — MAGIC MOUTHWASH W/LIDOCAINE: 5.0000 mL | Freq: Four times a day (QID) | ORAL | Status: DC | PRN

## 2015-01-15 MED ORDER — MAGIC MOUTHWASH W/LIDOCAINE: ORAL | Status: DC

## 2015-01-15 NOTE — Telephone Encounter (Signed)
Phoned in [place]

## 2015-01-15 NOTE — Telephone Encounter (Addendum)
Patient's son called to say his mother is having terrible mouth pain and needs something stronger than Tramadol.  Asked to speak with her.  She reports burning of her tongue and all over her mouth.  She said she cannot smoke or drink sodas due to the pain.  But she can drink water, Gatorade, and she has eaten some soup and milk shake.  She says she does not have any sort of prescribed mouth wash on hand.  Advised her I would take with the MD and let her know what we call in.  Her son got back on the line and said she needs at least "Percocet 10" for the pain, that the Tramadol is not helping and that those are all gone anyway.  Tramadol was prescribed 4 days ago, number 20.  Dr. Marko Plume advised me to call in [place] for her to use and to keep appointments that are already scheduled.

## 2015-01-16 NOTE — Telephone Encounter (Signed)
Jenell Milliner with [facility] in [place] called requesting concentrations of Magic Mouthwash ordered for this patient.  Instructed to give a 1:1 concentration.

## 2015-01-21 ENCOUNTER — Other Ambulatory Visit: Payer: Self-pay | Admitting: Oncology

## 2015-01-22 ENCOUNTER — Telehealth: Payer: Self-pay | Admitting: Oncology

## 2015-01-22 ENCOUNTER — Other Ambulatory Visit (HOSPITAL_BASED_OUTPATIENT_CLINIC_OR_DEPARTMENT_OTHER): Payer: Medicare Other

## 2015-01-22 ENCOUNTER — Ambulatory Visit (HOSPITAL_BASED_OUTPATIENT_CLINIC_OR_DEPARTMENT_OTHER): Payer: Medicare Other | Admitting: Oncology

## 2015-01-22 ENCOUNTER — Encounter: Payer: Self-pay | Admitting: Oncology

## 2015-01-22 VITALS — BP 87/50 | HR 108 | Temp 99.3°F | Resp 18 | Ht 63.0 in | Wt 142.4 lb

## 2015-01-22 DIAGNOSIS — Z95828 Presence of other vascular implants and grafts: Secondary | ICD-10-CM

## 2015-01-22 DIAGNOSIS — D72819 Decreased white blood cell count, unspecified: Secondary | ICD-10-CM

## 2015-01-22 DIAGNOSIS — K1230 Oral mucositis (ulcerative), unspecified: Secondary | ICD-10-CM | POA: Diagnosis not present

## 2015-01-22 DIAGNOSIS — Z1509 Genetic susceptibility to other malignant neoplasm: Secondary | ICD-10-CM

## 2015-01-22 DIAGNOSIS — D6481 Anemia due to antineoplastic chemotherapy: Secondary | ICD-10-CM

## 2015-01-22 DIAGNOSIS — T451X5A Adverse effect of antineoplastic and immunosuppressive drugs, initial encounter: Secondary | ICD-10-CM

## 2015-01-22 DIAGNOSIS — K635 Polyp of colon: Secondary | ICD-10-CM

## 2015-01-22 DIAGNOSIS — C541 Malignant neoplasm of endometrium: Secondary | ICD-10-CM

## 2015-01-22 DIAGNOSIS — F3181 Bipolar II disorder: Secondary | ICD-10-CM

## 2015-01-22 DIAGNOSIS — D701 Agranulocytosis secondary to cancer chemotherapy: Secondary | ICD-10-CM

## 2015-01-22 DIAGNOSIS — G620 Drug-induced polyneuropathy: Secondary | ICD-10-CM

## 2015-01-22 DIAGNOSIS — Z720 Tobacco use: Secondary | ICD-10-CM

## 2015-01-22 LAB — CBC WITH DIFFERENTIAL/PLATELET
BASO%: 0.1 % (ref 0.0–2.0)
BASOS ABS: 0 10*3/uL (ref 0.0–0.1)
EOS ABS: 0.1 10*3/uL (ref 0.0–0.5)
EOS%: 0.8 % (ref 0.0–7.0)
HEMATOCRIT: 29.9 % — AB (ref 34.8–46.6)
HEMOGLOBIN: 9.7 g/dL — AB (ref 11.6–15.9)
LYMPH#: 2.3 10*3/uL (ref 0.9–3.3)
LYMPH%: 27.2 % (ref 14.0–49.7)
MCH: 29.9 pg (ref 25.1–34.0)
MCHC: 32.4 g/dL (ref 31.5–36.0)
MCV: 92.3 fL (ref 79.5–101.0)
MONO#: 0.4 10*3/uL (ref 0.1–0.9)
MONO%: 4.8 % (ref 0.0–14.0)
NEUT%: 67.1 % (ref 38.4–76.8)
NEUTROS ABS: 5.6 10*3/uL (ref 1.5–6.5)
Platelets: 148 10*3/uL (ref 145–400)
RBC: 3.24 10*6/uL — ABNORMAL LOW (ref 3.70–5.45)
RDW: 17.7 % — ABNORMAL HIGH (ref 11.2–14.5)
WBC: 8.4 10*3/uL (ref 3.9–10.3)

## 2015-01-22 LAB — COMPREHENSIVE METABOLIC PANEL (CC13)
ALT: 25 U/L (ref 0–55)
ANION GAP: 12 meq/L — AB (ref 3–11)
AST: 21 U/L (ref 5–34)
Albumin: 3.3 g/dL — ABNORMAL LOW (ref 3.5–5.0)
Alkaline Phosphatase: 117 U/L (ref 40–150)
BUN: 6.1 mg/dL — AB (ref 7.0–26.0)
CHLORIDE: 110 meq/L — AB (ref 98–109)
CO2: 23 mEq/L (ref 22–29)
Calcium: 8.9 mg/dL (ref 8.4–10.4)
Creatinine: 0.8 mg/dL (ref 0.6–1.1)
EGFR: 89 mL/min/{1.73_m2} — AB (ref >90)
Glucose: 83 mg/dl (ref 70–140)
POTASSIUM: 3.9 meq/L (ref 3.5–5.1)
Sodium: 145 mEq/L (ref 136–145)
Total Bilirubin: <0.2 mg/dL (ref 0.20–1.20)
Total Protein: 6.4 g/dL (ref 6.4–8.3)

## 2015-01-22 MED ORDER — DEXAMETHASONE 4 MG PO TABS: ORAL_TABLET | ORAL | Status: DC

## 2015-01-22 MED ORDER — ACYCLOVIR 200 MG PO CAPS: 200.0000 mg | ORAL_CAPSULE | Freq: Three times a day (TID) | ORAL | Status: DC

## 2015-01-22 NOTE — Progress Notes (Signed)
OFFICE PROGRESS NOTE   January 22, 2015   Physicians:Paola Gehrig/ [name], Gerald Stabs RIchardson (gyn [place]), Erroll Luna, Louretta Parma Sanger, PCP [facility], Dr Melina Copa (GI in [place]), Kennyth Lose (psychiatry, [facility] in [place]), Gery Pray  INTERVAL HISTORY:  Patient is seen, together with daughter [name], in scheduled follow up of adjuvant chemotherapy in process for IA serous / endometrioid carcinoma of uterus, due cycle 5 carbo taxol on 01-25-15. She requires gCSF due to previous chemo neutropenia, given a week after treatment due to taxol aches. HDR will be given 3-24, 3-29, 4-5, 4-12, 4-19.  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 Even prior to Lynch diagnosis, she has been followed with yearly colonoscopies in [place] due to polyps. She has history of DCIS right breast, post bilateral mastectomies, but no abnormalities on breast/ ovarian genetics testing.  Major problem in past week has been oral ulcers, improved with MMW. Patient had aches after taxol and neulasta with cycle 4. See RN note 01-11-15. Patient is not complaining of pain now and has not requested any pain medication now. She has some minimal neuropathy tips of fingers and toes intermittently. Nausea is controlled and bowels are moving. PAC is not bothersome. She is fatigued, but is enjoying 3 week visit from her daughter, who is in [employer], in [place] x 2 years and will be going to [place].  PAC in Lynch syndrome   ONCOLOGIC HISTORY    Review of systems as above, also:  Remainder of 10 point Review of Systems negative.  Objective:  Vital signs in last 24 hours:  BP 87/50 mmHg  Pulse 108  Temp(Src) 99.3 F (37.4 C) (Oral)  Resp 18  Ht 5' 3"  (1.6 m)  Wt 142 lb 6.4 oz (64.592 kg)  BMI 25.23 kg/m2  Alert, oriented and appropriate. Ambulatory without assistance difficulty.  Alopecia  HEENT:PERRL, sclerae not icteric. Oral mucosa moist without  lesions, posterior pharynx clear.  Neck supple. No JVD.  Lymphatics:no cervical,suraclavicular, axillary or inguinal adenopathy Resp: clear to auscultation bilaterally and normal percussion bilaterally Cardio: regular rate and rhythm. No gallop. GI: soft, nontender, not distended, no mass or organomegaly. Normally active bowel sounds. Surgical incision not remarkable. Musculoskeletal/ Extremities: without pitting edema, cords, tenderness Neuro: no peripheral neuropathy. Otherwise nonfocal Skin without rash, ecchymosis, petechiae Breasts: without dominant mass, skin or nipple findings. Axillae benign. Portacath-without erythema or tenderness  Lab Results:  Results for orders placed or performed in visit on 01/22/15  CBC with Differential  Result Value Ref Range   WBC 8.4 3.9 - 10.3 10e3/uL   NEUT# 5.6 1.5 - 6.5 10e3/uL   HGB 9.7 (L) 11.6 - 15.9 g/dL   HCT 29.9 (L) 34.8 - 46.6 %   Platelets 148 145 - 400 10e3/uL   MCV 92.3 79.5 - 101.0 fL   MCH 29.9 25.1 - 34.0 pg   MCHC 32.4 31.5 - 36.0 g/dL   RBC 3.24 (L) 3.70 - 5.45 10e6/uL   RDW 17.7 (H) 11.2 - 14.5 %   lymph# 2.3 0.9 - 3.3 10e3/uL   MONO# 0.4 0.1 - 0.9 10e3/uL   Eosinophils Absolute 0.1 0.0 - 0.5 10e3/uL   Basophils Absolute 0.0 0.0 - 0.1 10e3/uL   NEUT% 67.1 38.4 - 76.8 %   LYMPH% 27.2 14.0 - 49.7 %   MONO% 4.8 0.0 - 14.0 %   EOS% 0.8 0.0 - 7.0 %   BASO% 0.1 0.0 - 2.0 %  Comprehensive metabolic panel (  Cmet) - [facility]  Result Value Ref Range   Sodium 145 136 - 145 mEq/L   Potassium 3.9 3.5 - 5.1 mEq/L   Chloride 110 (H) 98 - 109 mEq/L   CO2 23 22 - 29 mEq/L   Glucose 83 70 - 140 mg/dl   BUN 6.1 (L) 7.0 - 26.0 mg/dL   Creatinine 0.8 0.6 - 1.1 mg/dL   Total Bilirubin <0.20 0.20 - 1.20 mg/dL   Alkaline Phosphatase 117 40 - 150 U/L   AST 21 5 - 34 U/L   ALT 25 0 - 55 U/L   Total Protein 6.4 6.4 - 8.3 g/dL   Albumin 3.3 (L) 3.5 - 5.0 g/dL   Calcium 8.9 8.4 - 10.4 mg/dL   Anion Gap 12 (H) 3 - 11 mEq/L   EGFR 89 (L) >90  ml/min/1.73 m2     Studies/Results:  No results found.  Medications: I have reviewed the patient's current medications.  DISCUSSION  Assessment/Plan:        Gordy Levan, MD   01/22/2015, 4:32 PM

## 2015-01-22 NOTE — Telephone Encounter (Signed)
Spoke with patient and she is aware of her April appointments

## 2015-01-23 ENCOUNTER — Telehealth: Payer: Self-pay | Admitting: *Deleted

## 2015-01-23 NOTE — Telephone Encounter (Signed)
CALLED PATIENT TO REMIND OF HDR CASE FOR 01-24-15, CONFIRMED HDR CASE WITH PATIENT.

## 2015-01-24 ENCOUNTER — Ambulatory Visit
Admission: RE | Admit: 2015-01-24 | Discharge: 2015-01-24 | Disposition: A | Discharge status: Home / Self Care | Payer: Medicare Other | Source: Ambulatory Visit | Attending: Radiation Oncology | Admitting: Radiation Oncology

## 2015-01-24 ENCOUNTER — Encounter: Payer: Self-pay | Admitting: Radiation Oncology

## 2015-01-24 ENCOUNTER — Other Ambulatory Visit: Payer: Self-pay | Admitting: *Deleted

## 2015-01-24 VITALS — BP 96/61 | HR 98 | Temp 98.8°F | Resp 16 | Ht 63.0 in | Wt 140.3 lb

## 2015-01-24 DIAGNOSIS — C541 Malignant neoplasm of endometrium: Secondary | ICD-10-CM

## 2015-01-24 NOTE — Progress Notes (Signed)
  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 The patient will be treated with a vaginal cylinder measuring 3 cm in diameter. 3 vaginal rings,  3 cm diameter will be placed within the proximal vagina. More distally will be to 2.5 cm rings.    ________________________________  Blair Promise, PhD, MD

## 2015-01-24 NOTE — Progress Notes (Signed)
  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  Identity was confirmed.  All relevant records and images related to the planned course of therapy were reviewed.  The patient freely provided informed written consent to proceed with treatment after reviewing the details related to the planned course of therapy. The consent form was witnessed and verified by the simulation staff.  Then, the patient was set-up in a stable reproducible  supine position for radiation therapy.  the patient's custom vaginal cylinder was inserted into the vaginal vault. This was affixed to the CT/MR stabilization plate to prevent slippage. A fiducial marker was placed within the vaginal cylinder. CT images were obtained.  Surface markings were placed.  The CT images were loaded into the planning software.  Then the target and avoidance structures were contoured.  Treatment planning then occurred.  The radiation prescription was entered and confirmed.   I have requested : Brachytherapy Isodose Plan and Dosimetry Calculations to plan the radiation distribution.    PLAN:  The patient will receive 30  Gy in 5 fractions.  The patient will be treated with iridium 192 as the high-dose-rate source. A 3 cm diameter cylinder will be used to deliver the patient's treatment. Prescription will be to the mucosal surface.  Anticipated treatment length will be 3-4 cm.    ________________________________  Blair Promise, PhD, MD

## 2015-01-24 NOTE — Progress Notes (Signed)
  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  Identity was confirmed.  All relevant records and images related to the planned course of therapy were reviewed.  the patient was placed on the high-dose-rate treatment table. Her previously constructed custom vaginal cylinder was placed in the proximal vagina. This was affixed to the CT/MR stabilization plate to prevent slippage.   Verification simulation note  A fiducial marker was placed within the vaginal cylinder. An AP and lateral film was obtained. This was compared to the patient's planning films earlier in the day documenting accurate position of the vaginal cylinder for treatment area  HDR BRACHYTHERAPY treatment  The patient proceeded to undergo her first high-dose-rate treatment directed at the proximal vagina. The patient was prescribed a dose of 6 gray to be delivered to the mucosal surface. This was achieved with a 3 cm diameter cylinder. 9 dwell positions were used to deliver the patient's treatment using 1 channel. Total treatment time was 440.9 seconds. Patient tolerated the procedure well. After completion of her therapy a radiation survey was performed documenting return of the iridium source into the gamma med radiation safe.   ________________________________  Blair Promise, PhD, MD

## 2015-01-24 NOTE — Progress Notes (Signed)
Radiation Oncology         [phone]) [phone] ________________________________  Name: Laurie Benson MRN: [id number]  Date: 01/24/2015  DOB: 1966-07-25  Vaginal brachytherapy  Note  CC: Pcp Not In System  [name], [name], MD    ICD-9-CM ICD-10-CM   1. 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 The patient is close to completing her adjuvant chemotherapy. She denies any pelvic pain vaginal bleeding or vaginal discharge urination difficulties or bowel complaints.  She is fatigued from her chemotherapy. She admits to discomfort in the oral cavity with her chemotherapy                        ALLERGIES:  is allergic to tetracyclines & related.  Meds: Current Outpatient Prescriptions  Medication Sig Dispense Refill  . acyclovir (ZOVIRAX) 200 MG capsule Take 1 capsule (200 mg total) by mouth 3 (three) times daily. For mouth ulcers 21 capsule 0  . acyclovir ointment (ZOVIRAX) 5 % Apply to fever blister 5 x daily. 30 g 1  . Alum & Mag Hydroxide-Simeth (MAGIC MOUTHWASH W/LIDOCAINE) SOLN Swish and spit 5 mls four times per day 240 mL 1  . ARIPiprazole (ABILIFY) 20 MG tablet Take 20 mg by mouth daily.    Marland Kitchen aspirin-acetaminophen-caffeine (EXCEDRIN MIGRAINE) 250-250-65 MG per tablet Take 1 tablet by mouth every 6 (six) hours as needed.    . Choline Fenofibrate (FENOFIBRIC ACID) 135 MG CPDR Take 135 mg by mouth daily.     . clonazePAM (KLONOPIN) 2 MG tablet Take 2 mg by mouth 3 (three) times daily.     Marland Kitchen dexamethasone (DECADRON) 4 MG tablet Take 5 tablets by with food 12 hours and 6 hours before Taxol treatment 10 tablet 0  . docusate sodium (COLACE) 100 MG capsule Take 1 capsule (100 mg total) by mouth 2 (two) times daily. 60 capsule 2  . ferrous fumarate (HEMOCYTE - 106 MG FE) 325 (106 FE) MG TABS tablet Take 1 tablet (106 mg of  iron total) by mouth daily. Take 1 tablet by mouth daily on empty stomach with orange juice. 30 each 0  . FLUoxetine (PROZAC) 40 MG capsule Take 40 mg by mouth 2 (two) times daily.     . folic acid (FOLVITE) 973 MCG tablet Take 400 mcg by mouth daily.    Marland Kitchen ibuprofen (ADVIL,MOTRIN) 600 MG tablet Take 600 mg by mouth every 6 (six) hours as needed for moderate pain.    Marland Kitchen lidocaine-prilocaine (EMLA) cream Apply 1 application topically as needed. APPLY TO PORT A CATH ONE TO TWO HOURS PRIOR TO TREATMENT. 30 g 2  . ondansetron (ZOFRAN-ODT) 8 MG disintegrating tablet Take 8 mg by mouth. Takes one before and one after chemo    . pravastatin (PRAVACHOL) 40 MG tablet Take 40 mg by mouth daily.     . prochlorperazine (COMPAZINE) 10 MG tablet Take 1 tablet (10 mg total) by mouth every 6 (six) hours as needed for nausea or vomiting. 30 tablet 0  . QUEtiapine (SEROQUEL) 100 MG tablet Take 100 mg by mouth at bedtime.    . thiamine (VITAMIN B-1) 100 MG tablet Take 100 mg by mouth daily.    . vitamin B-12 (CYANOCOBALAMIN) 1000 MCG tablet Take 1,000 mcg by mouth daily.    . traMADol (ULTRAM) 50 MG  tablet Take 1-2 tablets by mouth every 6 hours as needed for pain. (Patient not taking: Reported on 01/22/2015) 20 tablet 0   No current facility-administered medications for this encounter.    Physical Findings: The patient is in no acute distress. Patient is alert and oriented.  height is 5\' 3"  (1.6 m) and weight is 140 lb 4.8 oz (63.64 kg). Her oral temperature is 98.8 F (37.1 C). Her blood pressure is 96/61 and her pulse is 98. Her respiration is 16. Marland Kitchen  No palpable inguinal adenopathy. On pelvic examination the external genitalia are unremarkable. A speculum exam is performed. There are no mucosal lesions noted in the vaginal vault. On bimanual examination there no pelvic masses appreciated. The vaginal cuff is intact.  Lab Findings: Lab Results  Component Value Date   WBC 8.4 01/22/2015   HGB 9.7* 01/22/2015    HCT 29.9* 01/22/2015   MCV 92.3 01/22/2015   PLT 148 01/22/2015   Vaginal brachytherapy procedure note  Patient proceeded to undergo fitting for her custom vaginal cylinder. The patient will be treated with a 3 cm diameter cylinder. This diameter cylinder distended the vaginal vault without undue discomfort.   Impression:  Successful vaginal brachytherapy procedure.  Plan:  Patient will proceed to the CT simulation suite for planning. Later in the day the patient will receive her first high-dose-rate treatment.  She will receive 5 brachytherapy treatments  ____________________________________ Blair Promise, MD

## 2015-01-24 NOTE — Progress Notes (Signed)
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 She requires gCSF due to previous chemo neutropenia, given a week after treatment due to taxol aches. HDR will be given 3-24, 3-29, 4-5, 4-12, 4-19.  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 Even prior to Lynch diagnosis, she has been followed with yearly colonoscopies in [place] due to polyps. She has history of DCIS right breast, post bilateral mastectomies, but no abnormalities on breast/ ovarian genetics testing.  Major problem in past week has been oral ulcers, improved with MMW but not fully resolved. Patient had aches after taxol and neulasta; see RN note 01-11-15. Patient is not complaining of pain now and has not requested any pain medication now. She has some minimal neuropathy tips of fingers and toes intermittently. Nausea is controlled and bowels are moving. PAC is not bothersome. She is fatigued, but is enjoying 3 week visit from her daughter, who is in [employer], in [place] x 2 years and will be going to [place].  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 She continued to have heavy menses and spotting between periods, seen back by Dr Ellouise Newer with ultrasound 07-27-14 which showed uterus 8x5x5 cm and biopsy, with 2 cm endometrial thickness and 2 cm intramural fibroid, normal appearing ovaries. Endometrial biopsy by Dr Marvel Plan 08-17-14 revealed FIGO grade 2  endometrial adenocarcinoma, with immunostains for p53 and p16 focally + supporting possible serous papillary component. She was seen in consultation by Dr Denman George on 08-31-14, with CT scheduled and recommendation for surgery followed possibly by adjuvant therapy. Genetics testing was also recommended, with concern for Lynch syndrome. CT AP 09-04-14 had LLL pulmonary scaring with lung bases otherwise clear, 1.6 cm hypodense lesion inferior posterior right hepatic lobe of unclear etiology, no adenopathy, no free fluid, ovaries normal, inhomogeneity of endometrium. Surgery was at [facility] 09-15-14 by Dr Alycia Rossetti, robotic assisted total laparoscopic hysterectomy, BSO, bilateral pelvic and para-arotic lymphednectomy. Pathology [facility] [phone]) from 09-15-14 found mixed adenocarcinoma ( 70% endometroid grade 3 and 30% serous grade 2) involving endometrium, with total size 6.5 x 2.8 x 0.8 cm and 2 mm myometrial invasion where wall 2.2 cm thick (9%). There was no involvement of serosa, lower uterine segment, cervix, adnexa or ovaries. There was no LVSI. Nodes: 3 right periaortic, no identified left periaortic (due to anatomy irregular on left at time of surgery), 5 right pelvic and 3 left pelvic nodes negative. ER + 90%. Pathologic stage IA. Operative note does not describe posterior liver region. Her case was discussed at [place] at [facility] on 10-04-14, with recommendation for adjuvant carboplatin taxol, and vaginal brachytherapy. Tumor was tested for microsatellite instability by 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  Patient was DC home day after surgery. She saw Dr Alycia Rossetti on 10-02-14 with small left vaginal cuff separation which was no longer apparent by 10-19-14. First carbo taxol 11-01-14. Lynch syndrome documented by  genetics testing sent 11-2014.  Patient also has history of DCIS right breast, post right mastectomy with  sentinel node and prophylactic left mastectomy 12-2010, with bilateral reconstructions. No genetic concerns on Breast Next panel also sent 11-2014.   Review of systems as above, also: No orthostatic symptoms but not drinking fluids optimally - discussed. Denies increased SOB or cough. Wearing dentures despite oral ulcers. Has decreased smoking further with oral ulcers. No external herpetic oral lesions.No soreness in throat. No fever. She has cut back cigarettes even further, discussed. Remainder of 10 point Review of Systems negative.  Objective:  Vital signs in last 24 hours:  BP 87/50 mmHg  Pulse 108  Temp(Src) 99.3 F (37.4 C) (Oral)  Resp 18  Ht $R'5\' 3"'TK$  (1.6 m)  Wt 142 lb 6.4 oz (64.592 kg)  BMI 25.23 kg/m2 Weight down 3 lbs. Usual BP frequently 96-295 systolic Alert, oriented and appropriate. Ambulatory without assistance. Looks comfortable, very pleasant, seems to have good rapport with daughter Complete alopecia  HEENT:PERRL, sclerae not icteric. Oral mucosa moist, aphthous type ulcers lower gums anterior and left, posterior pharynx clear. No thrush. Neck supple. No JVD.  Lymphatics:no cervical,supraclavicular, axillary or inguinal adenopathy Resp: clear to auscultation bilaterally and normal percussion bilaterally Cardio: regular rate and rhythm. No gallop. GI: soft, nontender, not distended, no mass or organomegaly. Normally active bowel sounds. Surgical incision not remarkable. Musculoskeletal/ Extremities: without pitting edema, cords, tenderness Neuro: no peripheral neuropathy. Otherwise nonfocal Skin without rash, ecchymosis, petechiae Portacath-without erythema or tenderness  Lab Results:  Results for orders placed or performed in visit on 01/22/15  CBC with Differential  Result Value Ref Range   WBC 8.4 3.9 - 10.3 10e3/uL   NEUT# 5.6 1.5 - 6.5 10e3/uL   HGB 9.7 (L) 11.6 - 15.9 g/dL   HCT 29.9 (L) 34.8 - 46.6 %   Platelets 148 145 - 400 10e3/uL   MCV 92.3 79.5 -  101.0 fL   MCH 29.9 25.1 - 34.0 pg   MCHC 32.4 31.5 - 36.0 g/dL   RBC 3.24 (L) 3.70 - 5.45 10e6/uL   RDW 17.7 (H) 11.2 - 14.5 %   lymph# 2.3 0.9 - 3.3 10e3/uL   MONO# 0.4 0.1 - 0.9 10e3/uL   Eosinophils Absolute 0.1 0.0 - 0.5 10e3/uL   Basophils Absolute 0.0 0.0 - 0.1 10e3/uL   NEUT% 67.1 38.4 - 76.8 %   LYMPH% 27.2 14.0 - 49.7 %   MONO% 4.8 0.0 - 14.0 %   EOS% 0.8 0.0 - 7.0 %   BASO% 0.1 0.0 - 2.0 %  Comprehensive metabolic panel (Cmet) - [facility]  Result Value Ref Range   Sodium 145 136 - 145 mEq/L   Potassium 3.9 3.5 - 5.1 mEq/L   Chloride 110 (H) 98 - 109 mEq/L   CO2 23 22 - 29 mEq/L   Glucose 83 70 - 140 mg/dl   BUN 6.1 (L) 7.0 - 26.0 mg/dL   Creatinine 0.8 0.6 - 1.1 mg/dL   Total Bilirubin <0.20 0.20 - 1.20 mg/dL   Alkaline Phosphatase 117 40 - 150 U/L   AST 21 5 - 34 U/L   ALT 25 0 - 55 U/L   Total Protein 6.4 6.4 - 8.3 g/dL   Albumin 3.3 (L) 3.5 - 5.0 g/dL   Calcium 8.9 8.4 - 10.4 mg/dL   Anion Gap 12 (H) 3 - 11 mEq/L   EGFR 89 (L) >90 ml/min/1.73 m2     Studies/Results:  No results found.  Medications:  I have reviewed the patient's current medications. Continue prn MMW. Add acyclovir 200 mg tid x 1 week See A/P re pain meds.  DISCUSSION: Lynch syndrome information reviewed with patient and this daughter. I have written note for daughter in hopes that she can have testing done thru [employer]. 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 Patient is in agreement with continuing treatment as planned.   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 I will see her back shortly prior to cycle 6, labs with my visit. Vaginal brachytherapy by Dr Sondra Come will be given concomitantly with completion of chemotherapy. 2.severe bone aches after  neulasta (and taxol). I believe that she used tramadol and claritin with most recent cycle, tho per phone note #20 tramadol prescribed 3-10 gone by 3-14. Would not prescribe large amounts when needed next. 3.Lynch syndrome, with MSH6 documented from testing sent 11-2014. Continue to encourage testing of family members, which our genetics counseling department is glad to facilitate. Note she has 6 children and ~ 11 grands, 2 children in [place] and others out of state and overseas. Note family history of colon cancer in father who died age 66, brain cancer paternal uncle in his 22s. 4.colon polyps: New diagnosis of Lynch syndrome, already followed with yearly colonoscopies by Dr Melina Copa.  5.personal hx DCIS right breast 2012, treated with right mastectomy and prophylactic left mastectomy with bilateral reconstruction, no radiation and no hormonal intervention 6.ongoing tobacco, which she has begun to address 7.chemo leukopenia and aches with neulasta, but has not wanted to make multiple trips for granix 8.bipolar 2 disorder (hypomanic) with history of PTSD and anxiety, followed by psychiatrist at [facility], on multiple medications, which have limited choice of antiemetics with chemo, but fortunately compazine and EMEND are working well 9. Slight elevation of AST, ALT, AP on chemistries today, bili ok. Per ED note, may be ETOH. 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 14.oral ulcers: better with MMW, will add oral acyclovir.  All questions answered. Chemo and neulasta orders confirmed for cycle 5. Patient and daughter expressed appreciation for care. TIme spent 25 min including >50% counseling and coordination of care.   Gordy Levan, MD   01/22/2015, 4:32 PM

## 2015-01-24 NOTE — Progress Notes (Signed)
Laurie Benson here for follow up.  She reports pain due to mouth sores from chemotherapy at a 1/10.  She is using magic mouthwash and acyclovir.  She denies bladder issues, bowel issues, vaginal/rectal bleeding and nausea.  She had her last round of chemotherapy 3 weeks ago and is scheduled for her next round tomorrow.  BP 96/61 mmHg  Pulse 98  Temp(Src) 98.8 F (37.1 C) (Oral)  Resp 16  Ht 5\' 3"  (1.6 m)  Wt 140 lb 4.8 oz (63.64 kg)  BMI 24.86 kg/m2  LMP 09/04/2014

## 2015-01-25 ENCOUNTER — Encounter: Payer: Self-pay | Admitting: Radiation Oncology

## 2015-01-25 ENCOUNTER — Ambulatory Visit (HOSPITAL_BASED_OUTPATIENT_CLINIC_OR_DEPARTMENT_OTHER): Payer: Medicare Other

## 2015-01-25 ENCOUNTER — Other Ambulatory Visit (HOSPITAL_BASED_OUTPATIENT_CLINIC_OR_DEPARTMENT_OTHER): Payer: Medicare Other

## 2015-01-25 DIAGNOSIS — C541 Malignant neoplasm of endometrium: Secondary | ICD-10-CM | POA: Diagnosis present

## 2015-01-25 DIAGNOSIS — Z5111 Encounter for antineoplastic chemotherapy: Secondary | ICD-10-CM

## 2015-01-25 LAB — CBC WITH DIFFERENTIAL/PLATELET
BASO%: 0.2 % (ref 0.0–2.0)
Basophils Absolute: 0 10*3/uL (ref 0.0–0.1)
EOS ABS: 0 10*3/uL (ref 0.0–0.5)
EOS%: 0 % (ref 0.0–7.0)
HCT: 31.2 % — ABNORMAL LOW (ref 34.8–46.6)
HEMOGLOBIN: 10.5 g/dL — AB (ref 11.6–15.9)
LYMPH%: 4.4 % — ABNORMAL LOW (ref 14.0–49.7)
MCH: 30.4 pg (ref 25.1–34.0)
MCHC: 33.5 g/dL (ref 31.5–36.0)
MCV: 90.7 fL (ref 79.5–101.0)
MONO#: 0 10*3/uL — AB (ref 0.1–0.9)
MONO%: 0.6 % (ref 0.0–14.0)
NEUT#: 6.5 10*3/uL (ref 1.5–6.5)
NEUT%: 94.8 % — ABNORMAL HIGH (ref 38.4–76.8)
Platelets: 242 10*3/uL (ref 145–400)
RBC: 3.44 10*6/uL — ABNORMAL LOW (ref 3.70–5.45)
RDW: 19.2 % — AB (ref 11.2–14.5)
WBC: 6.9 10*3/uL (ref 3.9–10.3)
lymph#: 0.3 10*3/uL — ABNORMAL LOW (ref 0.9–3.3)

## 2015-01-25 LAB — COMPREHENSIVE METABOLIC PANEL (CC13)
ALT: 35 U/L (ref 0–55)
AST: 28 U/L (ref 5–34)
Albumin: 3.5 g/dL (ref 3.5–5.0)
Alkaline Phosphatase: 114 U/L (ref 40–150)
Anion Gap: 13 mEq/L — ABNORMAL HIGH (ref 3–11)
BILIRUBIN TOTAL: 0.2 mg/dL (ref 0.20–1.20)
BUN: 6.7 mg/dL — ABNORMAL LOW (ref 7.0–26.0)
CO2: 20 mEq/L — ABNORMAL LOW (ref 22–29)
Calcium: 9.5 mg/dL (ref 8.4–10.4)
Chloride: 110 mEq/L — ABNORMAL HIGH (ref 98–109)
Creatinine: 0.9 mg/dL (ref 0.6–1.1)
EGFR: 73 mL/min/{1.73_m2} — ABNORMAL LOW (ref >90)
Glucose: 234 mg/dl — ABNORMAL HIGH (ref 70–140)
Potassium: 3.9 mEq/L (ref 3.5–5.1)
Sodium: 143 mEq/L (ref 136–145)
Total Protein: 7 g/dL (ref 6.4–8.3)

## 2015-01-25 MED ORDER — DIPHENHYDRAMINE HCL 50 MG/ML IJ SOLN
50.0000 mg | Freq: Once | INTRAMUSCULAR | Status: AC
  Administered 2015-01-25: 50 mg via INTRAVENOUS

## 2015-01-25 MED ORDER — DIPHENHYDRAMINE HCL 50 MG/ML IJ SOLN
INTRAMUSCULAR | Status: AC
  Filled 2015-01-25: qty 1

## 2015-01-25 MED ORDER — SODIUM CHLORIDE 0.9 % IV SOLN
388.0000 mg | Freq: Once | INTRAVENOUS | Status: AC
  Administered 2015-01-25: 390 mg via INTRAVENOUS
  Filled 2015-01-25: qty 39

## 2015-01-25 MED ORDER — SODIUM CHLORIDE 0.9 % IV SOLN
Freq: Once | INTRAVENOUS | Status: AC
  Administered 2015-01-25: 11:00:00 via INTRAVENOUS

## 2015-01-25 MED ORDER — PROCHLORPERAZINE EDISYLATE 5 MG/ML IJ SOLN
INTRAMUSCULAR | Status: AC
  Filled 2015-01-25: qty 2

## 2015-01-25 MED ORDER — PROCHLORPERAZINE EDISYLATE 5 MG/ML IJ SOLN
10.0000 mg | Freq: Once | INTRAMUSCULAR | Status: AC
  Administered 2015-01-25: 10 mg via INTRAVENOUS

## 2015-01-25 MED ORDER — SODIUM CHLORIDE 0.9 % IV SOLN
Freq: Once | INTRAVENOUS | Status: AC
  Administered 2015-01-25: 12:00:00 via INTRAVENOUS
  Filled 2015-01-25: qty 5

## 2015-01-25 MED ORDER — FAMOTIDINE IN NACL 20-0.9 MG/50ML-% IV SOLN
INTRAVENOUS | Status: AC
  Filled 2015-01-25: qty 50

## 2015-01-25 MED ORDER — PACLITAXEL CHEMO INJECTION 300 MG/50ML
135.0000 mg/m2 | Freq: Once | INTRAVENOUS | Status: AC
  Administered 2015-01-25: 222 mg via INTRAVENOUS
  Filled 2015-01-25: qty 37

## 2015-01-25 MED ORDER — HEPARIN SOD (PORK) LOCK FLUSH 100 UNIT/ML IV SOLN
500.0000 [IU] | Freq: Once | INTRAVENOUS | Status: AC | PRN
  Administered 2015-01-25: 500 [IU]
  Filled 2015-01-25: qty 5

## 2015-01-25 MED ORDER — FAMOTIDINE IN NACL 20-0.9 MG/50ML-% IV SOLN
20.0000 mg | Freq: Once | INTRAVENOUS | Status: AC
  Administered 2015-01-25: 20 mg via INTRAVENOUS

## 2015-01-25 MED ORDER — SODIUM CHLORIDE 0.9 % IJ SOLN
10.0000 mL | INTRAMUSCULAR | Status: DC | PRN
  Administered 2015-01-25: 10 mL
  Filled 2015-01-25: qty 10

## 2015-01-25 NOTE — Patient Instructions (Signed)
Central Discharge Instructions for Patients Receiving Chemotherapy  Today you received the following chemotherapy agents: Taxol and Carboplatin  To help prevent nausea and vomiting after your treatment, we encourage you to take your nausea medication:zofran 8 mg after chemotherapy.   If you develop nausea and vomiting that is not controlled by your nausea medication, call the clinic.   BELOW ARE SYMPTOMS THAT SHOULD BE REPORTED IMMEDIATELY:  *FEVER GREATER THAN 100.5 F  *CHILLS WITH OR WITHOUT FEVER  NAUSEA AND VOMITING THAT IS NOT CONTROLLED WITH YOUR NAUSEA MEDICATION  *UNUSUAL SHORTNESS OF BREATH  *UNUSUAL BRUISING OR BLEEDING  TENDERNESS IN MOUTH AND THROAT WITH OR WITHOUT PRESENCE OF ULCERS  *URINARY PROBLEMS  *BOWEL PROBLEMS  UNUSUAL RASH Items with * indicate a potential emergency and should be followed up as soon as possible.  Feel free to call the clinic you have any questions or concerns. The clinic phone number is [phone].  Please show the [place] at check-in to the Emergency Department and triage nurse.

## 2015-01-29 ENCOUNTER — Telehealth: Payer: Self-pay | Admitting: *Deleted

## 2015-01-29 NOTE — Telephone Encounter (Signed)
Called patient to remind of tx. For 01-30-15 @ 10 am, spoke with patient and she is aware of this appt.

## 2015-01-30 ENCOUNTER — Ambulatory Visit
Admission: RE | Admit: 2015-01-30 | Discharge: 2015-01-30 | Disposition: A | Discharge status: Home / Self Care | Payer: Medicare Other | Source: Ambulatory Visit | Attending: Radiation Oncology | Admitting: Radiation Oncology

## 2015-01-30 DIAGNOSIS — C541 Malignant neoplasm of endometrium: Secondary | ICD-10-CM | POA: Diagnosis not present

## 2015-01-30 NOTE — Progress Notes (Signed)
  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 The patient will be treated with a vaginal cylinder measuring 3 cm in diameter. 3 vaginal rings, 3 cm diameter will be placed within the proximal vagina. More distally will be to 2.5 cm rings.  Vaginal brachytherapy Note:  The patient was taken to the high-dose-rate suite and placed on the treatment table. A pelvic exam was performed showing the vaginal cuff to be intact. No pelvic masses appreciated. Patient then had placement of her custom vaginal cylinder within the vaginal vault. This was affixed to the CT/MR stabilization plate to prevent slippage. The patient tolerated the procedure well.   Verification simulation note  A fiducial marker was placed within the vaginal cylinder. An AP and lateral film was obtained. This was compared to the patient's planning films earlier in the day documenting accurate position of the vaginal cylinder for treatment area  HDR BRACHYTHERAPY treatment  The patient proceeded to undergo her second high-dose-rate treatment directed at the proximal vagina. The patient was prescribed a dose of 6 gray to be delivered to the mucosal surface. This was achieved with a 3 cm diameter cylinder. 9 dwell positions were used to deliver the patient's treatment using 1 channel. Total treatment time was 466.5 seconds. Patient tolerated the procedure well. After completion of her therapy a radiation survey was performed documenting return of the iridium source into the gamma med radiation safe.  ________________________________  Blair Promise, PhD, MD

## 2015-02-01 ENCOUNTER — Encounter: Payer: Self-pay | Admitting: Radiation Oncology

## 2015-02-01 ENCOUNTER — Ambulatory Visit (HOSPITAL_BASED_OUTPATIENT_CLINIC_OR_DEPARTMENT_OTHER): Payer: Medicare Other

## 2015-02-01 DIAGNOSIS — C541 Malignant neoplasm of endometrium: Secondary | ICD-10-CM | POA: Diagnosis present

## 2015-02-01 MED ORDER — PEGFILGRASTIM INJECTION 6 MG/0.6ML ~~LOC~~
6.0000 mg | PREFILLED_SYRINGE | Freq: Once | SUBCUTANEOUS | Status: AC
  Administered 2015-02-01: 6 mg via SUBCUTANEOUS
  Filled 2015-02-01: qty 0.6

## 2015-02-05 ENCOUNTER — Telehealth: Payer: Self-pay | Admitting: *Deleted

## 2015-02-05 NOTE — Telephone Encounter (Signed)
CALLED PATIENT TO REMIND OF HDR [place] 02-06-15 @ 10 AM, SPOKE WITH PATIENT AND SHE IS AWARE OF THIS APPT.

## 2015-02-06 ENCOUNTER — Ambulatory Visit
Admission: RE | Admit: 2015-02-06 | Discharge: 2015-02-06 | Disposition: A | Discharge status: Home / Self Care | Payer: Medicare Other | Source: Ambulatory Visit | Attending: Radiation Oncology | Admitting: Radiation Oncology

## 2015-02-06 DIAGNOSIS — C541 Malignant neoplasm of endometrium: Secondary | ICD-10-CM

## 2015-02-06 NOTE — Progress Notes (Signed)
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 The patient will be treated with a vaginal cylinder measuring 3 cm in diameter. 3 vaginal rings, 3 cm diameter will be placed within the proximal vagina. More distally will be to 2.5 cm rings.  Vaginal brachytherapy Note:  The patient was taken to the high-dose-rate suite and placed on the treatment table. A pelvic exam was performed showing the vaginal cuff to be intact. No pelvic masses appreciated. Patient then had placement of her custom vaginal cylinder within the vaginal vault. This was affixed to the CT/MR stabilization plate to prevent slippage. The patient tolerated the procedure well.   Verification simulation note  A fiducial marker was placed within the vaginal cylinder. An AP and lateral film was obtained. This was compared to the patient's planning films earlier in the day documenting accurate position of the vaginal cylinder for treatment area  HDR BRACHYTHERAPY treatment  The patient proceeded to undergo her third high-dose-rate treatment directed at the proximal vagina. The patient was prescribed a dose of 6 gray to be delivered to the mucosal surface. This was achieved with a 3 cm diameter cylinder. 9 dwell positions were used to deliver the patient's treatment using 1 channel. Total treatment time was 191.00 seconds. Patient tolerated the procedure well. After completion of her therapy a radiation survey was performed documenting return of the iridium source into the gamma med radiation safe.  ________________________________  Blair Promise, PhD, MD

## 2015-02-07 ENCOUNTER — Telehealth: Payer: Self-pay | Admitting: Oncology

## 2015-02-07 NOTE — Telephone Encounter (Signed)
pt cld to see when appt was-gave pt time & date

## 2015-02-08 ENCOUNTER — Encounter: Payer: Self-pay | Admitting: Radiation Oncology

## 2015-02-08 ENCOUNTER — Other Ambulatory Visit: Payer: Self-pay | Admitting: *Deleted

## 2015-02-08 DIAGNOSIS — C541 Malignant neoplasm of endometrium: Secondary | ICD-10-CM

## 2015-02-11 ENCOUNTER — Other Ambulatory Visit: Payer: Self-pay | Admitting: Oncology

## 2015-02-12 ENCOUNTER — Ambulatory Visit (HOSPITAL_BASED_OUTPATIENT_CLINIC_OR_DEPARTMENT_OTHER): Payer: Medicare Other | Admitting: Oncology

## 2015-02-12 ENCOUNTER — Encounter: Payer: Self-pay | Admitting: Oncology

## 2015-02-12 ENCOUNTER — Telehealth: Payer: Self-pay | Admitting: *Deleted

## 2015-02-12 ENCOUNTER — Other Ambulatory Visit (HOSPITAL_BASED_OUTPATIENT_CLINIC_OR_DEPARTMENT_OTHER): Payer: Medicare Other

## 2015-02-12 VITALS — BP 103/57 | HR 108 | Temp 99.1°F | Resp 18 | Ht 63.0 in | Wt 139.3 lb

## 2015-02-12 DIAGNOSIS — F419 Anxiety disorder, unspecified: Secondary | ICD-10-CM | POA: Diagnosis not present

## 2015-02-12 DIAGNOSIS — C541 Malignant neoplasm of endometrium: Secondary | ICD-10-CM

## 2015-02-12 DIAGNOSIS — F3181 Bipolar II disorder: Secondary | ICD-10-CM | POA: Diagnosis not present

## 2015-02-12 DIAGNOSIS — D701 Agranulocytosis secondary to cancer chemotherapy: Secondary | ICD-10-CM

## 2015-02-12 DIAGNOSIS — T451X5A Adverse effect of antineoplastic and immunosuppressive drugs, initial encounter: Secondary | ICD-10-CM

## 2015-02-12 DIAGNOSIS — K1230 Oral mucositis (ulcerative), unspecified: Secondary | ICD-10-CM

## 2015-02-12 DIAGNOSIS — D6481 Anemia due to antineoplastic chemotherapy: Secondary | ICD-10-CM | POA: Diagnosis not present

## 2015-02-12 DIAGNOSIS — Z1509 Genetic susceptibility to other malignant neoplasm: Secondary | ICD-10-CM | POA: Diagnosis not present

## 2015-02-12 DIAGNOSIS — Z853 Personal history of malignant neoplasm of breast: Secondary | ICD-10-CM | POA: Diagnosis not present

## 2015-02-12 DIAGNOSIS — Z720 Tobacco use: Secondary | ICD-10-CM | POA: Diagnosis not present

## 2015-02-12 DIAGNOSIS — F4310 Post-traumatic stress disorder, unspecified: Secondary | ICD-10-CM | POA: Diagnosis not present

## 2015-02-12 DIAGNOSIS — Z550 Illiteracy and low-level literacy: Secondary | ICD-10-CM | POA: Diagnosis not present

## 2015-02-12 DIAGNOSIS — Z95828 Presence of other vascular implants and grafts: Secondary | ICD-10-CM

## 2015-02-12 DIAGNOSIS — G620 Drug-induced polyneuropathy: Secondary | ICD-10-CM

## 2015-02-12 LAB — CBC WITH DIFFERENTIAL/PLATELET
BASO%: 0.6 % (ref 0.0–2.0)
BASOS ABS: 0 10*3/uL (ref 0.0–0.1)
EOS ABS: 0.1 10*3/uL (ref 0.0–0.5)
EOS%: 1.1 % (ref 0.0–7.0)
HCT: 26.6 % — ABNORMAL LOW (ref 34.8–46.6)
HEMOGLOBIN: 8.9 g/dL — AB (ref 11.6–15.9)
LYMPH%: 20.3 % (ref 14.0–49.7)
MCH: 31.6 pg (ref 25.1–34.0)
MCHC: 33.4 g/dL (ref 31.5–36.0)
MCV: 94.4 fL (ref 79.5–101.0)
MONO#: 0.3 10*3/uL (ref 0.1–0.9)
MONO%: 4.5 % (ref 0.0–14.0)
NEUT%: 73.5 % (ref 38.4–76.8)
NEUTROS ABS: 5.6 10*3/uL (ref 1.5–6.5)
Platelets: 136 10*3/uL — ABNORMAL LOW (ref 145–400)
RBC: 2.82 10*6/uL — ABNORMAL LOW (ref 3.70–5.45)
RDW: 18.9 % — ABNORMAL HIGH (ref 11.2–14.5)
WBC: 7.6 10*3/uL (ref 3.9–10.3)
lymph#: 1.5 10*3/uL (ref 0.9–3.3)

## 2015-02-12 LAB — COMPREHENSIVE METABOLIC PANEL (CC13)
ALT: 18 U/L (ref 0–55)
ANION GAP: 9 meq/L (ref 3–11)
AST: 17 U/L (ref 5–34)
Albumin: 3.3 g/dL — ABNORMAL LOW (ref 3.5–5.0)
Alkaline Phosphatase: 108 U/L (ref 40–150)
BUN: 4.8 mg/dL — ABNORMAL LOW (ref 7.0–26.0)
CALCIUM: 8.9 mg/dL (ref 8.4–10.4)
CHLORIDE: 109 meq/L (ref 98–109)
CO2: 24 meq/L (ref 22–29)
CREATININE: 0.7 mg/dL (ref 0.6–1.1)
EGFR: >90 mL/min/{1.73_m2} (ref >90)
Glucose: 115 mg/dl (ref 70–140)
Potassium: 3.6 mEq/L (ref 3.5–5.1)
Sodium: 142 mEq/L (ref 136–145)
Total Bilirubin: 0.3 mg/dL (ref 0.20–1.20)
Total Protein: 6.1 g/dL — ABNORMAL LOW (ref 6.4–8.3)

## 2015-02-12 MED ORDER — DEXAMETHASONE 4 MG PO TABS: ORAL_TABLET | ORAL | Status: DC

## 2015-02-12 NOTE — Progress Notes (Signed)
OFFICE PROGRESS NOTE   February 12, 2015   Physicians:Paola Gehrig/ [name], Gerald Stabs RIchardson (gyn [place]), Erroll Luna, Louretta Parma [place], PCP [facility], Dr Melina Copa (GI in [place]), Kennyth Lose (psychiatry, [facility] in [place]), Gery Pray  INTERVAL HISTORY:   Patient is seen, together with friend, as she continues adjuvant chemotherapy for IA serous/ endometrioid carcinoma of uterus, due cycle 6 carboplatin taxol on 02-15-15. She has required gCSF support with neulasta for this chemotherapy. HDR is underway by Dr Sondra Come, with treatments 4-12 and 02-20-15.  Patient has had more fatigue with exertion since last chemotherapy, including making her bed. She was visiting friends after last radiation, felt weak and had brief syncopal episodes. She had no seizure activity, no chest pain or palpitations, no increased SOB, recalls that she had eaten and drunk fluids. She had syncope once remotely, taken to ED and given blood transfusion.  She denies bleeding, SOB at rest, fever or symptoms of infection. Nausea has been minimal. She has minimal tingling in tips of fingers and toes, not interfering with activity.     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 She continued to have heavy menses and spotting between periods, seen back by Dr Ellouise Newer with ultrasound 07-27-14 which showed uterus 8x5x5 cm and biopsy, with 2 cm endometrial thickness and 2 cm intramural fibroid, normal appearing ovaries. Endometrial biopsy by Dr Marvel Plan 08-17-14 revealed FIGO grade 2 endometrial adenocarcinoma, with immunostains for p53 and p16 focally + supporting possible serous papillary component. She was seen in consultation by Dr Denman George on 08-31-14, with CT scheduled and recommendation for surgery followed possibly by adjuvant therapy. Genetics testing was also recommended, with  concern for Lynch syndrome. CT AP 09-04-14 had LLL pulmonary scaring with lung bases otherwise clear, 1.6 cm hypodense lesion inferior posterior right hepatic lobe of unclear etiology, no adenopathy, no free fluid, ovaries normal, inhomogeneity of endometrium. Surgery was at [facility] 09-15-14 by Dr Alycia Rossetti, robotic assisted total laparoscopic hysterectomy, BSO, bilateral pelvic and para-arotic lymphednectomy. Pathology [facility] [phone]) from 09-15-14 found mixed adenocarcinoma ( 70% endometroid grade 3 and 30% serous grade 2) involving endometrium, with total size 6.5 x 2.8 x 0.8 cm and 2 mm myometrial invasion where wall 2.2 cm thick (9%). There was no involvement of serosa, lower uterine segment, cervix, adnexa or ovaries. There was no LVSI. Nodes: 3 right periaortic, no identified left periaortic (due to anatomy irregular on left at time of surgery), 5 right pelvic and 3 left pelvic nodes negative. ER + 90%. Pathologic stage IA. Operative note does not describe posterior liver region. Her case was discussed at [place] at [facility] on 10-04-14, with recommendation for adjuvant carboplatin taxol, and vaginal brachytherapy. 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  Patient was DC home day after surgery. She saw Dr Alycia Rossetti on 10-02-14 with small left vaginal cuff separation which was no longer apparent by 10-19-14. First carbo taxol 11-01-14. Lynch syndrome documented by genetics testing sent 11-2014. Vaginal brachytherapy given.  Patient also has history of DCIS right breast, post right mastectomy with sentinel node and prophylactic left mastectomy 12-2010, with bilateral reconstructions. No genetic concerns on Breast Next panel also sent 11-2014.    Review of systems as above, also: Bladder ok. No problems with PAC. No  LE swelling. Remainder of 10 point Review of  Systems negative.  Objective:  Vital signs in last 24 hours:  BP 103/57 mmHg  Pulse 108  Temp(Src) 99.1 F (37.3 C) (Oral)  Resp 18  Ht 5' 3"  (1.6 m)  Wt 139 lb 4.8 oz (63.186 kg)  BMI 24.68 kg/m2  LMP 09/04/2014 Weight down 3 lbs Alert, oriented and appropriate. Ambulatory without assistance.  Alopecia  HEENT:PERRL, sclerae not icteric. Oral mucosa moist without lesions, posterior pharynx clear.  Neck supple. No JVD. No bruising or tender areas on head. Lymphatics:no cervical,supraclavicular, axillary or inguinal adenopathy Resp: clear to auscultation bilaterally and normal percussion bilaterally Cardio: regular rate and rhythm. No gallop.Clear heart sounds GI: soft, nontender, not distended, no mass or organomegaly. Normally active bowel sounds. Surgical incision not remarkable. Musculoskeletal/ Extremities: without pitting edema, cords, tenderness Neuro: no significant peripheral neuropathy. Otherwise nonfocal. PSYCH appropriate mood and affect. Skin without rash, ecchymosis, petechiae  Lab Results:  Results for orders placed or performed in visit on 02/12/15  CBC with Differential  Result Value Ref Range   WBC 7.6 3.9 - 10.3 10e3/uL   NEUT# 5.6 1.5 - 6.5 10e3/uL   HGB 8.9 (L) 11.6 - 15.9 g/dL   HCT 26.6 (L) 34.8 - 46.6 %   Platelets 136 (L) 145 - 400 10e3/uL   MCV 94.4 79.5 - 101.0 fL   MCH 31.6 25.1 - 34.0 pg   MCHC 33.4 31.5 - 36.0 g/dL   RBC 2.82 (L) 3.70 - 5.45 10e6/uL   RDW 18.9 (H) 11.2 - 14.5 %   lymph# 1.5 0.9 - 3.3 10e3/uL   MONO# 0.3 0.1 - 0.9 10e3/uL   Eosinophils Absolute 0.1 0.0 - 0.5 10e3/uL   Basophils Absolute 0.0 0.0 - 0.1 10e3/uL   NEUT% 73.5 38.4 - 76.8 %   LYMPH% 20.3 14.0 - 49.7 %   MONO% 4.5 0.0 - 14.0 %   EOS% 1.1 0.0 - 7.0 %   BASO% 0.6 0.0 - 2.0 %  Comprehensive metabolic panel (Cmet) - [facility]  Result Value Ref Range   Sodium 142 136 - 145 mEq/L   Potassium 3.6 3.5 - 5.1 mEq/L   Chloride 109 98 - 109 mEq/L   CO2 24 22 - 29 mEq/L    Glucose 115 70 - 140 mg/dl   BUN 4.8 (L) 7.0 - 26.0 mg/dL   Creatinine 0.7 0.6 - 1.1 mg/dL   Total Bilirubin 0.30 0.20 - 1.20 mg/dL   Alkaline Phosphatase 108 40 - 150 U/L   AST 17 5 - 34 U/L   ALT 18 0 - 55 U/L   Total Protein 6.1 (L) 6.4 - 8.3 g/dL   Albumin 3.3 (L) 3.5 - 5.0 g/dL   Calcium 8.9 8.4 - 10.4 mg/dL   Anion Gap 9 3 - 11 mEq/L   EGFR >90 >90 ml/min/1.73 m2    Hemoglobin down from 10.5 - 9.7 most recently. Iron studies ordered with next lab draw.  Studies/Results:  No results found.  Medications: I have reviewed the patient's current medications. Encouraged her to continue ferrous fumarate.   DISCUSSION: will repeat CBC later this week when she comes for last planned chemo, particularly with lower hemoglobin today and recent syncopal episodes. She needs to stay well hydrated. She should seek attention in ED if syncope recurs. Note HDR being given concomitantly with these last chemotherapy treatments.  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 (timing due to taxol aches).  Vaginal brachytherapy being given concomitantly with completion of chemotherapy. 2.severe bone aches after neulasta (and taxol). 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 Continue to encourage testing of family members, which our genetics counseling department is glad to facilitate. Note she has 6 children and ~ 11 grands, 2 children in [place] and others out of state and overseas. Note family history of colon cancer in father who died age 33, brain cancer paternal uncle in his 9s. 4.colon polyps: New diagnosis of Lynch syndrome, already followed with yearly colonoscopies by Dr Melina Copa.  5.personal hx DCIS right breast 2012, treated  with right mastectomy and prophylactic left mastectomy with bilateral reconstruction, no radiation and no hormonal intervention 6.ongoing tobacco, which she has begun to address 7.chemo leukopenia and aches with neulasta, but has not wanted to make multiple trips for granix 8.bipolar 2 disorder (hypomanic) with history of PTSD and anxiety, followed by psychiatrist at [facility], on multiple medications, which have limited choice of antiemetics with chemo, but fortunately compazine and EMEND are working well 9. Syncopal episodes as above. No findings obvious on exam now. Multiple medications. Plan as above 10 Chemo anemia: I have reminded her to take oral iron.  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 14.oral ulcers: better with MMW, will add oral acyclovir.   Patient and friend understand discussion and instructions. She knows to call prior to next scheduled appointment if needed. Chemo orders and neulasta confirmed. I will see her again with labs on 4-28, and she will need repeat scans prior to visit back to gyn oncology in 4-6 weeks.  [name],LENNIS P, MD   02/12/2015, 7:17 PM

## 2015-02-12 NOTE — Telephone Encounter (Signed)
Called patient to remind of HDR Tx. For 02-13-15, spoke with patient and she is aware of this tx.

## 2015-02-13 ENCOUNTER — Ambulatory Visit
Admission: RE | Admit: 2015-02-13 | Discharge: 2015-02-13 | Disposition: A | Discharge status: Home / Self Care | Payer: Medicare Other | Source: Ambulatory Visit | Attending: Radiation Oncology | Admitting: Radiation Oncology

## 2015-02-13 ENCOUNTER — Telehealth: Payer: Self-pay | Admitting: Oncology

## 2015-02-13 ENCOUNTER — Other Ambulatory Visit: Payer: Self-pay | Admitting: *Deleted

## 2015-02-13 DIAGNOSIS — C541 Malignant neoplasm of endometrium: Secondary | ICD-10-CM

## 2015-02-13 MED ORDER — IBUPROFEN 600 MG PO TABS: 600.0000 mg | ORAL_TABLET | Freq: Four times a day (QID) | ORAL | Status: DC | PRN

## 2015-02-13 NOTE — Progress Notes (Signed)
  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 The patient will be treated with a vaginal cylinder measuring 3 cm in diameter. 3 vaginal rings, 3 cm diameter will be placed within the proximal vagina. More distally will be to 2.5 cm rings.  Vaginal brachytherapy Note  The patient was taken to the high-dose-rate suite and placed on the treatment table. A pelvic exam was performed showing the vaginal cuff to be intact. No pelvic masses appreciated. Patient then had placement of her custom vaginal cylinder within the vaginal vault. This was affixed to the CT/MR stabilization plate to prevent slippage. The patient tolerated the procedure well.   Verification simulation note  A fiducial marker was placed within the vaginal cylinder. An AP and lateral film was obtained. This was compared to the patient's planning films earlier in the day documenting accurate position of the vaginal cylinder for treatment area  HDR BRACHYTHERAPY treatment  The patient proceeded to undergo her fourth high-dose-rate treatment directed at the proximal vagina. The patient was prescribed a dose of 6 gray to be delivered to the mucosal surface. This was achieved with a 3 cm diameter cylinder. 9 dwell positions were used to deliver the patient's treatment using 1 channel. Total treatment time was 204.0 seconds. Patient tolerated the procedure well. After completion of her therapy a radiation survey was performed documenting return of the iridium source into the gamma med radiation safe.  ________________________________  Blair Promise, PhD, MD

## 2015-02-13 NOTE — Telephone Encounter (Signed)
Spoke with patient and she is aware of her lab on 4/14

## 2015-02-13 NOTE — Progress Notes (Signed)
Called patient and let her know refill for motrin has been sent to her pharmacy. Patient agreeable to pickup medication.

## 2015-02-14 ENCOUNTER — Other Ambulatory Visit: Payer: Self-pay | Admitting: Oncology

## 2015-02-15 ENCOUNTER — Other Ambulatory Visit (HOSPITAL_COMMUNITY)
Admission: RE | Admit: 2015-02-15 | Discharge: 2015-02-15 | Disposition: A | Discharge status: Home / Self Care | Payer: Medicare Other | Source: Ambulatory Visit | Attending: Oncology | Admitting: Oncology

## 2015-02-15 ENCOUNTER — Other Ambulatory Visit (HOSPITAL_BASED_OUTPATIENT_CLINIC_OR_DEPARTMENT_OTHER): Payer: Medicare Other

## 2015-02-15 ENCOUNTER — Telehealth: Payer: Self-pay

## 2015-02-15 ENCOUNTER — Ambulatory Visit (HOSPITAL_BASED_OUTPATIENT_CLINIC_OR_DEPARTMENT_OTHER): Payer: Medicare Other

## 2015-02-15 VITALS — BP 140/76 | HR 103 | Temp 98.2°F | Resp 20

## 2015-02-15 DIAGNOSIS — D6481 Anemia due to antineoplastic chemotherapy: Secondary | ICD-10-CM | POA: Diagnosis not present

## 2015-02-15 DIAGNOSIS — C541 Malignant neoplasm of endometrium: Secondary | ICD-10-CM

## 2015-02-15 DIAGNOSIS — Z5111 Encounter for antineoplastic chemotherapy: Secondary | ICD-10-CM | POA: Diagnosis not present

## 2015-02-15 LAB — CBC WITH DIFFERENTIAL/PLATELET
BASO%: 0.2 % (ref 0.0–2.0)
BASOS ABS: 0 10*3/uL (ref 0.0–0.1)
EOS%: 0 % (ref 0.0–7.0)
Eosinophils Absolute: 0 10*3/uL (ref 0.0–0.5)
HCT: 31.2 % — ABNORMAL LOW (ref 34.8–46.6)
HEMOGLOBIN: 10.4 g/dL — AB (ref 11.6–15.9)
LYMPH#: 0.2 10*3/uL — AB (ref 0.9–3.3)
LYMPH%: 6.6 % — ABNORMAL LOW (ref 14.0–49.7)
MCH: 31.3 pg (ref 25.1–34.0)
MCHC: 33.4 g/dL (ref 31.5–36.0)
MCV: 93.9 fL (ref 79.5–101.0)
MONO#: 0 10*3/uL — ABNORMAL LOW (ref 0.1–0.9)
MONO%: 0.5 % (ref 0.0–14.0)
NEUT#: 3.2 10*3/uL (ref 1.5–6.5)
NEUT%: 92.7 % — ABNORMAL HIGH (ref 38.4–76.8)
Platelets: 161 10*3/uL (ref 145–400)
RBC: 3.32 10*6/uL — ABNORMAL LOW (ref 3.70–5.45)
RDW: 18.9 % — AB (ref 11.2–14.5)
WBC: 3.5 10*3/uL — ABNORMAL LOW (ref 3.9–10.3)

## 2015-02-15 LAB — IRON AND TIBC CHCC
%SAT: 18 % — AB (ref 21–57)
Iron: 54 ug/dL (ref 41–142)
TIBC: 303 ug/dL (ref 236–444)
UIBC: 248 ug/dL (ref 120–384)

## 2015-02-15 MED ORDER — PROCHLORPERAZINE EDISYLATE 5 MG/ML IJ SOLN
INTRAMUSCULAR | Status: AC
  Filled 2015-02-15: qty 2

## 2015-02-15 MED ORDER — PROCHLORPERAZINE EDISYLATE 5 MG/ML IJ SOLN
10.0000 mg | Freq: Once | INTRAMUSCULAR | Status: AC
  Administered 2015-02-15: 10 mg via INTRAVENOUS

## 2015-02-15 MED ORDER — PACLITAXEL CHEMO INJECTION 300 MG/50ML
135.0000 mg/m2 | Freq: Once | INTRAVENOUS | Status: AC
  Administered 2015-02-15: 222 mg via INTRAVENOUS
  Filled 2015-02-15: qty 37

## 2015-02-15 MED ORDER — FAMOTIDINE IN NACL 20-0.9 MG/50ML-% IV SOLN
INTRAVENOUS | Status: AC
  Filled 2015-02-15: qty 50

## 2015-02-15 MED ORDER — SODIUM CHLORIDE 0.9 % IV SOLN
424.0000 mg | Freq: Once | INTRAVENOUS | Status: AC
  Administered 2015-02-15: 420 mg via INTRAVENOUS
  Filled 2015-02-15: qty 42

## 2015-02-15 MED ORDER — SODIUM CHLORIDE 0.9 % IJ SOLN
10.0000 mL | INTRAMUSCULAR | Status: DC | PRN
  Administered 2015-02-15: 10 mL
  Filled 2015-02-15: qty 10

## 2015-02-15 MED ORDER — SODIUM CHLORIDE 0.9 % IV SOLN: 530.0000 mg | Freq: Once | INTRAVENOUS | Status: DC

## 2015-02-15 MED ORDER — DIPHENHYDRAMINE HCL 50 MG/ML IJ SOLN
INTRAMUSCULAR | Status: AC
  Filled 2015-02-15: qty 1

## 2015-02-15 MED ORDER — FAMOTIDINE IN NACL 20-0.9 MG/50ML-% IV SOLN
20.0000 mg | Freq: Once | INTRAVENOUS | Status: AC
  Administered 2015-02-15: 20 mg via INTRAVENOUS

## 2015-02-15 MED ORDER — HEPARIN SOD (PORK) LOCK FLUSH 100 UNIT/ML IV SOLN
500.0000 [IU] | Freq: Once | INTRAVENOUS | Status: AC | PRN
  Administered 2015-02-15: 500 [IU]
  Filled 2015-02-15: qty 5

## 2015-02-15 MED ORDER — SODIUM CHLORIDE 0.9 % IV SOLN
Freq: Once | INTRAVENOUS | Status: AC
  Administered 2015-02-15: 10:00:00 via INTRAVENOUS
  Filled 2015-02-15: qty 5

## 2015-02-15 MED ORDER — DIPHENHYDRAMINE HCL 50 MG/ML IJ SOLN
50.0000 mg | Freq: Once | INTRAMUSCULAR | Status: AC
  Administered 2015-02-15: 50 mg via INTRAVENOUS

## 2015-02-15 MED ORDER — SODIUM CHLORIDE 0.9 % IV SOLN
Freq: Once | INTRAVENOUS | Status: AC
  Administered 2015-02-15: 10:00:00 via INTRAVENOUS

## 2015-02-15 NOTE — Telephone Encounter (Signed)
Patient's hgb. much better today at 10.4.  Pt. States she is less fatigued then she was on Monday.

## 2015-02-15 NOTE — Patient Instructions (Signed)
[facility] Discharge Instructions for Patients Receiving Chemotherapy  Today you received the following chemotherapy agents: Taxol, Carboplatin  To help prevent nausea and vomiting after your treatment, we encourage you to take your nausea medication as prescribed by your physician.   If you develop nausea and vomiting that is not controlled by your nausea medication, call the clinic.   BELOW ARE SYMPTOMS THAT SHOULD BE REPORTED IMMEDIATELY:  *FEVER GREATER THAN 100.5 F  *CHILLS WITH OR WITHOUT FEVER  NAUSEA AND VOMITING THAT IS NOT CONTROLLED WITH YOUR NAUSEA MEDICATION  *UNUSUAL SHORTNESS OF BREATH  *UNUSUAL BRUISING OR BLEEDING  TENDERNESS IN MOUTH AND THROAT WITH OR WITHOUT PRESENCE OF ULCERS  *URINARY PROBLEMS  *BOWEL PROBLEMS  UNUSUAL RASH Items with * indicate a potential emergency and should be followed up as soon as possible.  Feel free to call the clinic you have any questions or concerns. The clinic phone number is [phone].  Please show the CHEMO ALERT CARD at check-in to the Emergency Department and triage nurse.   

## 2015-02-15 NOTE — Telephone Encounter (Signed)
-----   Message from Gordy Levan, MD sent at 02/12/2015  4:19 PM EDT ----- For CBC on 4-14. If hgb <=8.5 or more symptoms may need PRBCs

## 2015-02-19 ENCOUNTER — Telehealth: Payer: Self-pay | Admitting: *Deleted

## 2015-02-19 NOTE — Telephone Encounter (Signed)
CALLED PATIENT TO REMIND OF HDR [place] 02-20-15 @ 10 AM, SPOKE WITH PATIENT AND SHE IS AWARE OF THIS APPT.

## 2015-02-20 ENCOUNTER — Ambulatory Visit
Admission: RE | Admit: 2015-02-20 | Discharge: 2015-02-20 | Disposition: A | Discharge status: Home / Self Care | Payer: Medicare Other | Source: Ambulatory Visit | Attending: Radiation Oncology | Admitting: Radiation Oncology

## 2015-02-20 ENCOUNTER — Ambulatory Visit (HOSPITAL_BASED_OUTPATIENT_CLINIC_OR_DEPARTMENT_OTHER): Payer: Medicare Other

## 2015-02-20 ENCOUNTER — Encounter: Payer: Self-pay | Admitting: Radiation Oncology

## 2015-02-20 VITALS — BP 101/65 | HR 92 | Temp 98.2°F

## 2015-02-20 DIAGNOSIS — C541 Malignant neoplasm of endometrium: Secondary | ICD-10-CM | POA: Diagnosis not present

## 2015-02-20 DIAGNOSIS — Z5189 Encounter for other specified aftercare: Secondary | ICD-10-CM | POA: Diagnosis not present

## 2015-02-20 MED ORDER — PEGFILGRASTIM INJECTION 6 MG/0.6ML ~~LOC~~
6.0000 mg | PREFILLED_SYRINGE | Freq: Once | SUBCUTANEOUS | Status: AC
  Administered 2015-02-20: 6 mg via SUBCUTANEOUS
  Filled 2015-02-20: qty 0.6

## 2015-02-20 NOTE — Progress Notes (Signed)
  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 The patient will be treated with a vaginal cylinder measuring 3 cm in diameter. 3 vaginal rings, 3 cm diameter will be placed within the proximal vagina. More distally will be to 2.5 cm rings.  Vaginal brachytherapy Note  The patient was taken to the high-dose-rate suite and placed on the treatment table. A pelvic exam was performed showing the vaginal cuff to be intact. No pelvic masses appreciated. Patient then had placement of her custom vaginal cylinder within the vaginal vault. This was affixed to the CT/MR stabilization plate to prevent slippage. The patient tolerated the procedure well.   Verification simulation note  A fiducial marker was placed within the vaginal cylinder. An AP and lateral film was obtained. This was compared to the patient's planning films earlier in the day documenting accurate position of the vaginal cylinder for treatment area  HDR BRACHYTHERAPY treatment  The patient proceeded to undergo her fifth high-dose-rate treatment directed at the proximal vagina. The patient was prescribed a dose of 6 gray to be delivered to the mucosal surface. This was achieved with a 3 cm diameter cylinder. 9 dwell positions were used to deliver the patient's treatment using 1 channel. Total treatment time was 217.8 seconds. Patient tolerated the procedure well. After completion of her therapy a radiation survey was performed documenting return of the iridium source into the gamma med radiation safe.  ________________________________  Blair Promise, PhD, MD

## 2015-02-23 NOTE — Progress Notes (Incomplete)
°  Radiation Oncology         [phone] ________________________________  Name: Laurie Benson MRN: [id number]  Date: 02/20/2015  DOB: February 01, 1966  End of Treatment Note   ICD-9-CM ICD-10-CM    1. 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   ***  Plan: The patient has completed radiation treatment. The patient will return to radiation oncology clinic for routine followup in one month. I advised them to call or return sooner if they have any questions or concerns related to their recovery or treatment.  -----------------------------------  Blair Promise, PhD, MD

## 2015-02-26 ENCOUNTER — Encounter: Payer: Self-pay | Admitting: Radiation Oncology

## 2015-02-26 NOTE — Progress Notes (Signed)
  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   Minimal discomfort in the vulvar/vaginal area  Plan: The patient has completed radiation treatment. The patient will return to radiation oncology clinic for routine followup in one month. I advised them to call or return sooner if they have any questions or concerns related to their recovery or treatment.  -----------------------------------  Blair Promise, PhD, MD

## 2015-02-26 NOTE — Progress Notes (Signed)
Progress Notes   DURENDA PECHACEK (MR# [id number])      Progress Notes Info    Author Note Status Last Update User Last Update Date/Time   Reather Littler Incomplete Reather Littler 02/23/2015 3:20 PM    Progress Notes    Expand All Collapse All    Radiation Oncology [phone] ________________________________  Name: Edye Hainline HARRISMRN: [id number] Date: 4/19/2016DOB: Mar 17, 1966  End of Treatment Note   ICD-9-CM ICD-10-CM    1. 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 Patient received 6 Gy/ fraction delivered to the mucosal surface, 4 CM treatment length  Narrative: The patient tolerated radiation treatment relatively well. minimal discomfort with urination and in the vaginal area  Plan: The patient has completed radiation treatment. The patient will return to radiation oncology clinic for routine followup in one month. I advised them to call or return sooner if they have any questions or concerns related to their recovery or treatment.  -----------------------------------  Blair Promise, PhD, MD

## 2015-02-28 ENCOUNTER — Other Ambulatory Visit: Payer: Self-pay | Admitting: Oncology

## 2015-03-01 ENCOUNTER — Encounter: Payer: Self-pay | Admitting: Oncology

## 2015-03-01 ENCOUNTER — Other Ambulatory Visit (HOSPITAL_BASED_OUTPATIENT_CLINIC_OR_DEPARTMENT_OTHER): Payer: Medicare Other

## 2015-03-01 ENCOUNTER — Ambulatory Visit (HOSPITAL_BASED_OUTPATIENT_CLINIC_OR_DEPARTMENT_OTHER): Payer: Medicare Other | Admitting: Oncology

## 2015-03-01 VITALS — BP 90/38 | HR 85 | Temp 99.0°F | Resp 18 | Ht 63.0 in | Wt 136.1 lb

## 2015-03-01 DIAGNOSIS — G620 Drug-induced polyneuropathy: Secondary | ICD-10-CM

## 2015-03-01 DIAGNOSIS — T451X5A Adverse effect of antineoplastic and immunosuppressive drugs, initial encounter: Secondary | ICD-10-CM

## 2015-03-01 DIAGNOSIS — D6481 Anemia due to antineoplastic chemotherapy: Secondary | ICD-10-CM | POA: Diagnosis not present

## 2015-03-01 DIAGNOSIS — D0511 Intraductal carcinoma in situ of right breast: Secondary | ICD-10-CM

## 2015-03-01 DIAGNOSIS — Z1509 Genetic susceptibility to other malignant neoplasm: Secondary | ICD-10-CM

## 2015-03-01 DIAGNOSIS — D696 Thrombocytopenia, unspecified: Secondary | ICD-10-CM

## 2015-03-01 DIAGNOSIS — Z95828 Presence of other vascular implants and grafts: Secondary | ICD-10-CM

## 2015-03-01 DIAGNOSIS — C541 Malignant neoplasm of endometrium: Secondary | ICD-10-CM

## 2015-03-01 DIAGNOSIS — Z720 Tobacco use: Secondary | ICD-10-CM | POA: Diagnosis not present

## 2015-03-01 DIAGNOSIS — F3181 Bipolar II disorder: Secondary | ICD-10-CM

## 2015-03-01 DIAGNOSIS — D701 Agranulocytosis secondary to cancer chemotherapy: Secondary | ICD-10-CM

## 2015-03-01 DIAGNOSIS — K635 Polyp of colon: Secondary | ICD-10-CM | POA: Diagnosis not present

## 2015-03-01 DIAGNOSIS — Z853 Personal history of malignant neoplasm of breast: Secondary | ICD-10-CM | POA: Diagnosis not present

## 2015-03-01 LAB — COMPREHENSIVE METABOLIC PANEL (CC13)
ALBUMIN: 3.4 g/dL — AB (ref 3.5–5.0)
ALK PHOS: 82 U/L (ref 40–150)
ALT: 9 U/L (ref 0–55)
AST: 10 U/L (ref 5–34)
Anion Gap: 13 mEq/L — ABNORMAL HIGH (ref 3–11)
BUN: 5.5 mg/dL — ABNORMAL LOW (ref 7.0–26.0)
CALCIUM: 8.8 mg/dL (ref 8.4–10.4)
CO2: 23 mEq/L (ref 22–29)
CREATININE: 0.8 mg/dL (ref 0.6–1.1)
Chloride: 106 mEq/L (ref 98–109)
Glucose: 94 mg/dl (ref 70–140)
Potassium: 3.7 mEq/L (ref 3.5–5.1)
Sodium: 143 mEq/L (ref 136–145)
Total Bilirubin: <0.2 mg/dL (ref 0.20–1.20)
Total Protein: 5.9 g/dL — ABNORMAL LOW (ref 6.4–8.3)

## 2015-03-01 LAB — CBC WITH DIFFERENTIAL/PLATELET
BASO%: 0.5 % (ref 0.0–2.0)
BASOS ABS: 0 10*3/uL (ref 0.0–0.1)
EOS%: 2.1 % (ref 0.0–7.0)
Eosinophils Absolute: 0.1 10*3/uL (ref 0.0–0.5)
HEMATOCRIT: 25.1 % — AB (ref 34.8–46.6)
HGB: 8.2 g/dL — ABNORMAL LOW (ref 11.6–15.9)
LYMPH#: 1.5 10*3/uL (ref 0.9–3.3)
LYMPH%: 23.5 % (ref 14.0–49.7)
MCH: 31.7 pg (ref 25.1–34.0)
MCHC: 32.8 g/dL (ref 31.5–36.0)
MCV: 96.9 fL (ref 79.5–101.0)
MONO#: 0.3 10*3/uL (ref 0.1–0.9)
MONO%: 4.7 % (ref 0.0–14.0)
NEUT#: 4.5 10*3/uL (ref 1.5–6.5)
NEUT%: 69.2 % (ref 38.4–76.8)
Platelets: 74 10*3/uL — ABNORMAL LOW (ref 145–400)
RBC: 2.59 10*6/uL — ABNORMAL LOW (ref 3.70–5.45)
RDW: 18.5 % — ABNORMAL HIGH (ref 11.2–14.5)
WBC: 6.5 10*3/uL (ref 3.9–10.3)

## 2015-03-01 MED ORDER — TRAMADOL HCL 50 MG PO TABS: ORAL_TABLET | ORAL | Status: DC

## 2015-03-01 NOTE — Progress Notes (Signed)
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 She is to see Dr Sondra Come a month from completion of radiation. She has been diagnosed with Lynch syndrome by genetics testing done following this gyn cancer diagnoses. Patient tells me that she had colonoscopy by Dr Melina Copa on 02-28-15, with a single tiny polyp and 3 year follow up recommended.  She had DCIS right breast 2012, treated with right mastectomy and prophylactic left mastectomy, with reconstructions.   Patient has fatigue, but overall seems to be recovering from all of recent treatment. She is not SOB at rest and has had no chest pain. She denies bleeding. She has had no problems with PAC. She is able to eat and is drinking fluids. She denies abdominal or pelvic pain.   PAC flushed 02-15-15 Lynch syndrome  ONCOLOGIC HISTORY ENDOMETRIAL CARCINOMA:Patient had heavy vaginal bleeding perhaps a year ago, seen by gyn in [place] and recalls that PAP was ok. She continued to have heavy menses and spotting between periods, seen back by Dr Ellouise Newer with ultrasound 07-27-14 which showed uterus 8x5x5 cm and biopsy, with 2 cm endometrial thickness and 2 cm intramural fibroid, normal appearing ovaries. Endometrial biopsy by Dr Marvel Plan 08-17-14 revealed FIGO grade 2 endometrial adenocarcinoma, with immunostains for p53 and p16 focally + supporting possible serous papillary component. She was seen in  consultation by Dr Denman George on 08-31-14, with CT scheduled and recommendation for surgery followed possibly by adjuvant therapy. Genetics testing was also recommended, with concern for Lynch syndrome. CT AP 09-04-14 had LLL pulmonary scaring with lung bases otherwise clear, 1.6 cm hypodense lesion inferior posterior right hepatic lobe of unclear etiology, no adenopathy, no free fluid, ovaries normal, inhomogeneity of endometrium. Surgery was at [facility] 09-15-14 by Dr Alycia Rossetti, robotic assisted total laparoscopic hysterectomy, BSO, bilateral pelvic and para-arotic lymphednectomy. Pathology [facility] [phone]) from 09-15-14 found mixed adenocarcinoma ( 70% endometroid grade 3 and 30% serous grade 2) involving endometrium, with total size 6.5 x 2.8 x 0.8 cm and 2 mm myometrial invasion where wall 2.2 cm thick (9%). There was no involvement of serosa, lower uterine segment, cervix, adnexa or ovaries. There was no LVSI. Nodes: 3 right periaortic, no identified left periaortic (due to anatomy irregular on left at time of surgery), 5 right pelvic and 3 left pelvic nodes negative. ER + 90%. Pathologic stage IA. Operative note does not describe posterior liver region. Her case was discussed at [place] at [facility] on 10-04-14, with recommendation for adjuvant carboplatin taxol, and vaginal brachytherapy. 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 Carbo taxol given x 6 cycles from12-30-15 thru 02-15-15, with neulasta support.  Radiation by Dr Sondra Come: Radiation treatment dates: 01/24/2015, 01/30/2015, 02/06/2015, 02/13/2015, 02/20/2015 Site/dose: Proximal vagina, 4 cm treatment length, 6 gray to the mucosal surface total dose 30 gray Beams/energy: Iridium 192 as the high-dose-rate source, 3 cm vaginal cylinder  [facility] SYNDROME documented by genetics testing sent  11-2014.   DCIS RIGHT  BREAST post right mastectomy with sentinel node and prophylactic left mastectomy 12-2010, with bilateral reconstructions. No genetic concerns on Breast Next panel also sent 11-2014.      Review of systems as above, also: No fever or symptoms of infection. Some aching in thighs and legs since completion of treatment. Peripheral neuropathy slight in feet, none significant in hands. No problems with PAC. Remainder of 10 point Review of Systems negative.  Objective:  Vital signs in last 24 hours:  BP 90/38 mmHg  Pulse 85  Temp(Src) 99 F (37.2 C) (Oral)  Resp 18  Ht 5' 3"  (1.6 m)  Wt 136 lb 1.6 oz (61.735 kg)  BMI 24.12 kg/m2  LMP 09/04/2014 Weight down 3 lbs.  Alert, oriented and appropriate. Ambulatory without assistance. Respirations not labored RA with exertion in exam room Alopecia  HEENT:PERRL, sclerae not icteric. Oral mucosa moist without lesions, posterior pharynx clear.  Neck supple. No JVD.  Lymphatics:no cervical,supraclavicular, axillary or inguinal adenopathy Resp: clear to auscultation bilaterally and normal percussion bilaterally Cardio: regular rate and rhythm. No gallop. GI: soft, nontender, not distended, no mass or organomegaly. Normally active bowel sounds. Surgical incisions not remarkable. Musculoskeletal/ Extremities: without pitting edema, cords, tenderness Neuro: no increased peripheral neuropathy. Otherwise nonfocal. PSYCH appropriate mood and affect Skin without rash, ecchymosis, petechiae Portacath-without erythema or tenderness  Lab Results:  Results for orders placed or performed in visit on 03/01/15  CBC with Differential  Result Value Ref Range   WBC 6.5 3.9 - 10.3 10e3/uL   NEUT# 4.5 1.5 - 6.5 10e3/uL   HGB 8.2 (L) 11.6 - 15.9 g/dL   HCT 25.1 (L) 34.8 - 46.6 %   Platelets 74 (L) 145 - 400 10e3/uL   MCV 96.9 79.5 - 101.0 fL   MCH 31.7 25.1 - 34.0 pg   MCHC 32.8 31.5 - 36.0 g/dL   RBC 2.59 (L) 3.70 - 5.45 10e6/uL   RDW 18.5 (H) 11.2 - 14.5  %   lymph# 1.5 0.9 - 3.3 10e3/uL   MONO# 0.3 0.1 - 0.9 10e3/uL   Eosinophils Absolute 0.1 0.0 - 0.5 10e3/uL   Basophils Absolute 0.0 0.0 - 0.1 10e3/uL   NEUT% 69.2 38.4 - 76.8 %   LYMPH% 23.5 14.0 - 49.7 %   MONO% 4.7 0.0 - 14.0 %   EOS% 2.1 0.0 - 7.0 %   BASO% 0.5 0.0 - 2.0 %  Comprehensive metabolic panel (Cmet) - [facility]  Result Value Ref Range   Sodium 143 136 - 145 mEq/L   Potassium 3.7 3.5 - 5.1 mEq/L   Chloride 106 98 - 109 mEq/L   CO2 23 22 - 29 mEq/L   Glucose 94 70 - 140 mg/dl   BUN 5.5 (L) 7.0 - 26.0 mg/dL   Creatinine 0.8 0.6 - 1.1 mg/dL   Total Bilirubin <0.20 0.20 - 1.20 mg/dL   Alkaline Phosphatase 82 40 - 150 U/L   AST 10 5 - 34 U/L   ALT 9 0 - 55 U/L   Total Protein 5.9 (L) 6.4 - 8.3 g/dL   Albumin 3.4 (L) 3.5 - 5.0 g/dL   Calcium 8.8 8.4 - 10.4 mg/dL   Anion Gap 13 (H) 3 - 11 mEq/L   EGFR >90 >90 ml/min/1.73 m2     Studies/Results:  No results found.  Medications: I have reviewed the patient's current medications. Patient requests tramadol for leg aches, #12 written and should not be refilled at this office unless seen  by physician. Continue oral iron   DISCUSSION: patient understands that we will watch blood counts until recovered, and will continue to follow at this office,  in addition to radiation oncology and gyn oncology, with the Lynch syndrome and other oncologic history. She agrees with keeping PAC for now, will try to coordinate flushes with visits as possible, needs flush every 6-8 weeks when not otherwise used. She knows to call prior to next scheduled appointments if needed.   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  Vaginal brachytherapy completed 02-20-15. Will follow up labs coordinating with Dr Clabe Seal visit in May. Will get labs + CT AP in June prior to seeing gyn oncology. I will see her at least in July.  2.Lynch  syndrome, with MSH6 documented from testing sent 11-2014. Continue to encourage testing of family members, which our genetics counseling department is glad to facilitate. Note she has 6 children and ~ 11 grands, 2 children in [place] and others out of state and overseas. Note family history of colon cancer in father who died age 62, brain cancer paternal uncle in his 32s. 3.colon polyps: New diagnosis of Lynch syndrome, already followed with colonoscopies by Dr Melina Copa.WIll request colonoscopy report from 02-28-15, per patient 3 year follow up recommended.  4.personal hx DCIS right breast 2012, treated with right mastectomy and prophylactic left mastectomy with bilateral reconstruction, no radiation and no hormonal intervention 5..ongoing tobacco, which she has begun to address 6..Chemo anemia and thrombocytopenia: WBC/ ANC ok. Will repeat labs with Dr Clabe Seal visit May 23 and with CT in June. Continue oral iron. Not symptomatic enough now for PRBCs and should recover given time out from chemo RT 7..bipolar 2 disorder (hypomanic) with history of PTSD and anxiety, followed by psychiatrist at [facility], on multiple medications, which limited choice of antiemetics with chemo, but did well with compazine and EMEND  8. 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   Cc this note to Drs Melina Copa, Zella Ball [facility] (name of PCP not known) All questions answered and patient is in agreement with recommendations and plans above. Time spent 25 min including >50% counseling and coordination of care.    Daneisha Surges P, MD   03/01/2015, 10:06 PM

## 2015-03-04 ENCOUNTER — Other Ambulatory Visit: Payer: Self-pay | Admitting: Oncology

## 2015-03-04 DIAGNOSIS — Z95828 Presence of other vascular implants and grafts: Secondary | ICD-10-CM

## 2015-03-04 DIAGNOSIS — Z720 Tobacco use: Secondary | ICD-10-CM

## 2015-03-04 DIAGNOSIS — K635 Polyp of colon: Secondary | ICD-10-CM | POA: Insufficient documentation

## 2015-03-04 DIAGNOSIS — D6481 Anemia due to antineoplastic chemotherapy: Secondary | ICD-10-CM | POA: Insufficient documentation

## 2015-03-04 DIAGNOSIS — T451X5A Adverse effect of antineoplastic and immunosuppressive drugs, initial encounter: Secondary | ICD-10-CM

## 2015-03-04 DIAGNOSIS — D701 Agranulocytosis secondary to cancer chemotherapy: Secondary | ICD-10-CM | POA: Insufficient documentation

## 2015-03-04 DIAGNOSIS — F17200 Nicotine dependence, unspecified, uncomplicated: Secondary | ICD-10-CM | POA: Insufficient documentation

## 2015-03-04 DIAGNOSIS — D0510 Intraductal carcinoma in situ of unspecified breast: Secondary | ICD-10-CM | POA: Insufficient documentation

## 2015-03-04 HISTORY — DX: Tobacco use: Z72.0

## 2015-03-04 HISTORY — DX: Adverse effect of antineoplastic and immunosuppressive drugs, initial encounter: D70.1

## 2015-03-04 HISTORY — DX: Presence of other vascular implants and grafts: Z95.828

## 2015-03-04 HISTORY — DX: Adverse effect of antineoplastic and immunosuppressive drugs, initial encounter: T45.1X5A

## 2015-03-04 HISTORY — DX: Polyp of colon: K63.5

## 2015-03-04 NOTE — Progress Notes (Signed)
[place] END OF TREATMENT   Name: Laurie Benson Date: Mar 04, 2015  MRN: [id number] DOB: 1966-06-14   TREATMENT DATES: 11-01-14 thru 02-15-15   REFERRING PHYSICIAN: Nancy Marus  DIAGNOSIS: serous (30%) endometrioid (70%) endometrial carcinoma.   STAGE AT START OF TREATMENT: FIGO IA   INTENT: curative   DRUGS OR REGIMENS GIVEN: taxol carboplatin   MAJOR TOXICITIES: cytopenias, taxol aches, fatigue   REASON TREATMENT STOPPED: completion of planned course   PERFORMANCE STATUS AT END: 1  ONGOING PROBLEMS: fatigue, anemia, thrombocytopenia, peripheral neuropathy   FOLLOW UP PLANS: follow labs, repeat CT, follow up radiation oncology, gyn oncology, medical oncology

## 2015-03-26 ENCOUNTER — Encounter: Payer: Self-pay | Admitting: Oncology

## 2015-03-26 ENCOUNTER — Other Ambulatory Visit (HOSPITAL_BASED_OUTPATIENT_CLINIC_OR_DEPARTMENT_OTHER): Payer: Medicare Other

## 2015-03-26 ENCOUNTER — Ambulatory Visit
Admission: RE | Admit: 2015-03-26 | Discharge: 2015-03-26 | Disposition: A | Discharge status: Home / Self Care | Payer: Medicare Other | Source: Ambulatory Visit | Attending: Radiation Oncology | Admitting: Radiation Oncology

## 2015-03-26 ENCOUNTER — Ambulatory Visit (HOSPITAL_BASED_OUTPATIENT_CLINIC_OR_DEPARTMENT_OTHER): Payer: Medicare Other

## 2015-03-26 VITALS — BP 104/57 | HR 85 | Temp 98.9°F | Resp 20 | Ht 63.0 in | Wt 135.2 lb

## 2015-03-26 DIAGNOSIS — Z95828 Presence of other vascular implants and grafts: Secondary | ICD-10-CM

## 2015-03-26 DIAGNOSIS — C541 Malignant neoplasm of endometrium: Secondary | ICD-10-CM

## 2015-03-26 HISTORY — DX: Reserved for concepts with insufficient information to code with codable children: IMO0002

## 2015-03-26 HISTORY — DX: Reserved for inherently not codable concepts without codable children: IMO0001

## 2015-03-26 LAB — CBC WITH DIFFERENTIAL/PLATELET
BASO%: 1.1 % (ref 0.0–2.0)
Basophils Absolute: 0.1 10*3/uL (ref 0.0–0.1)
EOS ABS: 0.1 10*3/uL (ref 0.0–0.5)
EOS%: 2.3 % (ref 0.0–7.0)
HEMATOCRIT: 30.4 % — AB (ref 34.8–46.6)
HGB: 10.2 g/dL — ABNORMAL LOW (ref 11.6–15.9)
LYMPH%: 34.8 % (ref 14.0–49.7)
MCH: 32.6 pg (ref 25.1–34.0)
MCHC: 33.7 g/dL (ref 31.5–36.0)
MCV: 96.8 fL (ref 79.5–101.0)
MONO#: 0.3 10*3/uL (ref 0.1–0.9)
MONO%: 5.9 % (ref 0.0–14.0)
NEUT#: 2.7 10*3/uL (ref 1.5–6.5)
NEUT%: 55.9 % (ref 38.4–76.8)
PLATELETS: 248 10*3/uL (ref 145–400)
RBC: 3.14 10*6/uL — ABNORMAL LOW (ref 3.70–5.45)
RDW: 16.5 % — AB (ref 11.2–14.5)
WBC: 4.9 10*3/uL (ref 3.9–10.3)
lymph#: 1.7 10*3/uL (ref 0.9–3.3)

## 2015-03-26 MED ORDER — SODIUM CHLORIDE 0.9 % IJ SOLN
10.0000 mL | INTRAMUSCULAR | Status: DC | PRN
  Administered 2015-03-26: 10 mL via INTRAVENOUS
  Filled 2015-03-26: qty 10

## 2015-03-26 MED ORDER — HEPARIN SOD (PORK) LOCK FLUSH 100 UNIT/ML IV SOLN
500.0000 [IU] | Freq: Once | INTRAVENOUS | Status: AC
  Administered 2015-03-26: 500 [IU] via INTRAVENOUS
  Filled 2015-03-26: qty 5

## 2015-03-26 NOTE — Progress Notes (Signed)
Radiation Oncology         [phone] ________________________________  Name: Laurie Benson MRN: [id number]  Date: 03/26/2015  DOB: 09/02/1966  Follow-Up Visit Note  CC: Pcp Not In System  Gordy Levan, MD    ICD-9-CM ICD-10-CM   1. Endometrial cancer 182.0 C54.1     Diagnosis:   FIGO Stage IA Mixed subtype endometroid (70%) and serous carcinoma(30%)   Interval Since Last Radiation:  33  days  Narrative:  The patient returns today for routine follow-up. She denies pain, bladder issues. She reports having constipation during chemotherapy which is better now. She denies having any vaginal discharge or bleeding.   She was recently diagnosed with lynch syndrome.                            ALLERGIES:  is allergic to tetracyclines & related.  Meds: Current Outpatient Prescriptions  Medication Sig Dispense Refill  . acyclovir ointment (ZOVIRAX) 5 % Apply to fever blister 5 x daily. 30 g 1  . ARIPiprazole (ABILIFY) 20 MG tablet Take 20 mg by mouth daily.    . Choline Fenofibrate (FENOFIBRIC ACID) 135 MG CPDR Take 135 mg by mouth daily.     . clonazePAM (KLONOPIN) 2 MG tablet Take 2 mg by mouth 3 (three) times daily.     Marland Kitchen docusate sodium (COLACE) 100 MG capsule Take 1 capsule (100 mg total) by mouth 2 (two) times daily. 60 capsule 2  . ferrous fumarate (HEMOCYTE - 106 MG FE) 325 (106 FE) MG TABS tablet Take 1 tablet (106 mg of iron total) by mouth daily. Take 1 tablet by mouth daily on empty stomach with orange juice. 30 each 0  . FLUoxetine (PROZAC) 40 MG capsule Take 40 mg by mouth 2 (two) times daily.     . folic acid (FOLVITE) 476 MCG tablet Take 400 mcg by mouth daily.    Marland Kitchen ibuprofen (ADVIL,MOTRIN) 600 MG tablet Take 1 tablet (600 mg total) by mouth every 6 (six) hours as needed for moderate pain. 30 tablet 2  . lidocaine-prilocaine (EMLA) cream Apply 1 application topically as needed. APPLY TO PORT A CATH ONE TO TWO HOURS PRIOR TO TREATMENT. 30 g 2  . pravastatin  (PRAVACHOL) 40 MG tablet Take 40 mg by mouth daily.     . QUEtiapine (SEROQUEL) 100 MG tablet Take 100 mg by mouth at bedtime.    . thiamine (VITAMIN B-1) 100 MG tablet Take 100 mg by mouth daily.    . vitamin B-12 (CYANOCOBALAMIN) 1000 MCG tablet Take 1,000 mcg by mouth daily.    . Alum & Mag Hydroxide-Simeth (MAGIC MOUTHWASH W/LIDOCAINE) SOLN Swish and spit 5 mls four times per day (Patient not taking: Reported on 02/12/2015) 240 mL 1  . aspirin-acetaminophen-caffeine (EXCEDRIN MIGRAINE) [phone] MG per tablet Take 1 tablet by mouth every 6 (six) hours as needed.    . ondansetron (ZOFRAN-ODT) 8 MG disintegrating tablet Take 8 mg by mouth. Takes one before and one after chemo    . prochlorperazine (COMPAZINE) 10 MG tablet Take 1 tablet (10 mg total) by mouth every 6 (six) hours as needed for nausea or vomiting. (Patient not taking: Reported on 02/12/2015) 30 tablet 0  . traMADol (ULTRAM) 50 MG tablet Take 1-2 tablets by mouth every 6 hours as needed for pain. (Patient not taking: Reported on 03/26/2015) 12 tablet 0   No current facility-administered medications for this encounter.   Facility-Administered Medications  Ordered in Other Encounters  Medication Dose Route Frequency Provider Last Rate Last Dose  . sodium chloride 0.9 % injection 10 mL  10 mL Intravenous PRN Lennis Marion Downer, MD   10 mL at 03/26/15 1526    Physical Findings: The patient is in no acute distress. Patient is alert and oriented.  height is 5\' 3"  (1.6 m) and weight is 135 lb 3.2 oz (61.326 kg). Her oral temperature is 98.9 F (37.2 C). Her blood pressure is 104/57 and her pulse is 85. Her respiration is 20 and oxygen saturation is 98%. .  Patient not examined today.  Lab Findings: Lab Results  Component Value Date   WBC 4.9 03/26/2015   HGB 10.2* 03/26/2015   HCT 30.4* 03/26/2015   MCV 96.8 03/26/2015   PLT 248 03/26/2015    Radiographic Findings: No results found.  Impression:  Clinically stable. She has  completed all of her adjuvant treatment including 6 cycles of carboplatin/Taxol and vaginal brachytherapy  Plan:  I have recommended follow-up with gynecologic oncology in 2 months. Follow-up in radiation oncology in 5 months. Today the patient was given a vaginal dilator and instructions on its use.  ____________________________________ Blair Promise, MD

## 2015-03-26 NOTE — Patient Instructions (Signed)

## 2015-03-26 NOTE — Progress Notes (Addendum)
Laurie Benson here for follow up with her daughter and grandson.  She denies pain, bladder issues.  She reports having constipation during chemotherapy which is better now.  She denies having any vaginal discharge or bleeding.  She reports her appetite is "fair."  She has lost 4 lbs since 02/12/15.  She said she has had mouth sores that prevented her from eating.  She said they are getting better.  She reports her energy level is improving.  She was recently diagnosed with lynch syndrome.  She has been given a size M vaginal dilator and water based lubricant.  She was instructed to apply water based lubricant to the dilator and insert in for 10 minutes 3 times a week (Monday, Wednesday and Friday) and to clean the dilator with soap and water after use.   [name] verbalized understanding in teach back mode.  She has also been given a 5 month follow up appointment card.  BP 104/57 mmHg  Pulse 85  Temp(Src) 98.9 F (37.2 C) (Oral)  Resp 20  Ht 5\' 3"  (1.6 m)  Wt 135 lb 3.2 oz (61.326 kg)  BMI 23.96 kg/m2  SpO2 98%  LMP 09/04/2014

## 2015-03-30 ENCOUNTER — Telehealth: Payer: Self-pay

## 2015-03-30 NOTE — Telephone Encounter (Signed)
Told Ms. Gionfriddo the results of the lab work as noted below by Dr. Marko Plume.  Ms. Mazzaferro picked up a schedule on 03-26-15, however, reviewed dates and times through July with patient.

## 2015-03-30 NOTE — Telephone Encounter (Signed)
-----   Message from Gordy Levan, MD sent at 03/30/2015  8:35 AM EDT ----- Labs seen and need follow up: please let her know blood counts much better this week - she is still a little anemic, but improved. Contiue iron.  Be sure she knows next appointments    Thank you

## 2015-04-11 ENCOUNTER — Encounter: Payer: Self-pay | Admitting: Skilled Nursing Facility1

## 2015-04-11 NOTE — Progress Notes (Signed)
Subjective:     Patient ID: Laurie Benson, female   DOB: 01/31/1966, 49 y.o.   MRN: [id number]  HPI   Review of Systems     Objective:   Physical Exam To assist the pt in identifying dietary strategies to gain some lost wt back.    Assessment:     Pt was identified as being malnourished due to some lost wt. Pt was contacted via telephone at [phone]. Pt was found to be in good spirits and states she lost 5 pounds but then states she was 150 pounds before treatment but is now 135 pounds for a wt loss of 15 pounds. Pt states she has no appetite and is avoiding bread and pasta because she believes them to be unhealthy. Pt states her mouth is soar and has a burning feeling which also inhibits her from eating. Pt asked if she could start drinking boost.    Plan:     Dietitian advised the pt to set an alarm for at least three times a day to remind her to eat. Dietitian's main recommendations: drink a glass of whole milk every morning and try to eat something with it, swish her mouthwash every afternoon and have her main meal directly after that as her mouth has some relief at this time, keep foods she enjoys stocked in the house so these foods are readily available, and to not avoid any food categories including carbohydrates as they are needed for energy and overall health. Dietitian stated she could speak with her doctor about possibly starting boost drinks, she could also buy them from her local grocery store.  Dietitian encouraged pt to call back if she had any questions or concerns.

## 2015-04-16 ENCOUNTER — Ambulatory Visit (HOSPITAL_BASED_OUTPATIENT_CLINIC_OR_DEPARTMENT_OTHER): Payer: Medicare Other

## 2015-04-16 ENCOUNTER — Ambulatory Visit (HOSPITAL_COMMUNITY)
Admission: RE | Admit: 2015-04-16 | Discharge: 2015-04-16 | Disposition: A | Discharge status: Home / Self Care | Payer: Medicare Other | Source: Ambulatory Visit | Attending: Oncology | Admitting: Oncology

## 2015-04-16 ENCOUNTER — Other Ambulatory Visit (HOSPITAL_BASED_OUTPATIENT_CLINIC_OR_DEPARTMENT_OTHER): Payer: Medicare Other

## 2015-04-16 ENCOUNTER — Encounter (HOSPITAL_COMMUNITY): Payer: Self-pay

## 2015-04-16 VITALS — BP 114/64 | HR 95 | Temp 97.9°F | Resp 16

## 2015-04-16 DIAGNOSIS — Z1509 Genetic susceptibility to other malignant neoplasm: Secondary | ICD-10-CM | POA: Diagnosis not present

## 2015-04-16 DIAGNOSIS — Z95828 Presence of other vascular implants and grafts: Secondary | ICD-10-CM

## 2015-04-16 DIAGNOSIS — C541 Malignant neoplasm of endometrium: Secondary | ICD-10-CM | POA: Diagnosis present

## 2015-04-16 LAB — CBC WITH DIFFERENTIAL/PLATELET
BASO%: 0.3 % (ref 0.0–2.0)
Basophils Absolute: 0 10*3/uL (ref 0.0–0.1)
EOS ABS: 0.3 10*3/uL (ref 0.0–0.5)
EOS%: 4.2 % (ref 0.0–7.0)
HCT: 34.4 % — ABNORMAL LOW (ref 34.8–46.6)
HEMOGLOBIN: 11.5 g/dL — AB (ref 11.6–15.9)
LYMPH#: 1.6 10*3/uL (ref 0.9–3.3)
LYMPH%: 25.3 % (ref 14.0–49.7)
MCH: 32.4 pg (ref 25.1–34.0)
MCHC: 33.4 g/dL (ref 31.5–36.0)
MCV: 96.9 fL (ref 79.5–101.0)
MONO#: 0.5 10*3/uL (ref 0.1–0.9)
MONO%: 8.2 % (ref 0.0–14.0)
NEUT%: 62 % (ref 38.4–76.8)
NEUTROS ABS: 4 10*3/uL (ref 1.5–6.5)
Platelets: 197 10*3/uL (ref 145–400)
RBC: 3.55 10*6/uL — ABNORMAL LOW (ref 3.70–5.45)
RDW: 14.1 % (ref 11.2–14.5)
WBC: 6.4 10*3/uL (ref 3.9–10.3)

## 2015-04-16 LAB — COMPREHENSIVE METABOLIC PANEL (CC13)
ALT: 13 U/L (ref 0–55)
AST: 18 U/L (ref 5–34)
Albumin: 3.6 g/dL (ref 3.5–5.0)
Alkaline Phosphatase: 72 U/L (ref 40–150)
Anion Gap: 11 mEq/L (ref 3–11)
BUN: 7.9 mg/dL (ref 7.0–26.0)
CALCIUM: 9 mg/dL (ref 8.4–10.4)
CHLORIDE: 106 meq/L (ref 98–109)
CO2: 25 mEq/L (ref 22–29)
CREATININE: 0.8 mg/dL (ref 0.6–1.1)
EGFR: 84 mL/min/{1.73_m2} — ABNORMAL LOW (ref >90)
GLUCOSE: 104 mg/dL (ref 70–140)
POTASSIUM: 3.7 meq/L (ref 3.5–5.1)
Sodium: 142 mEq/L (ref 136–145)
Total Bilirubin: 0.22 mg/dL (ref 0.20–1.20)
Total Protein: 6.5 g/dL (ref 6.4–8.3)

## 2015-04-16 MED ORDER — HEPARIN SOD (PORK) LOCK FLUSH 100 UNIT/ML IV SOLN
500.0000 [IU] | Freq: Once | INTRAVENOUS | Status: AC
  Administered 2015-04-16: 500 [IU] via INTRAVENOUS
  Filled 2015-04-16: qty 5

## 2015-04-16 MED ORDER — IOHEXOL 300 MG/ML  SOLN
100.0000 mL | Freq: Once | INTRAMUSCULAR | Status: AC | PRN
  Administered 2015-04-16: 100 mL via INTRAVENOUS

## 2015-04-16 MED ORDER — SODIUM CHLORIDE 0.9 % IJ SOLN
10.0000 mL | INTRAMUSCULAR | Status: DC | PRN
  Administered 2015-04-16: 10 mL via INTRAVENOUS
  Filled 2015-04-16: qty 10

## 2015-04-16 NOTE — Patient Instructions (Addendum)

## 2015-04-18 ENCOUNTER — Telehealth: Payer: Self-pay

## 2015-04-18 ENCOUNTER — Ambulatory Visit: Payer: Medicare Other | Attending: Gynecologic Oncology | Admitting: Gynecologic Oncology

## 2015-04-18 ENCOUNTER — Encounter: Payer: Self-pay | Admitting: Gynecologic Oncology

## 2015-04-18 VITALS — BP 103/71 | HR 103 | Temp 98.8°F | Resp 18 | Ht 63.0 in | Wt 131.7 lb

## 2015-04-18 DIAGNOSIS — Z9013 Acquired absence of bilateral breasts and nipples: Secondary | ICD-10-CM

## 2015-04-18 DIAGNOSIS — Z1509 Genetic susceptibility to other malignant neoplasm: Secondary | ICD-10-CM

## 2015-04-18 DIAGNOSIS — Z86010 Personal history of colonic polyps: Secondary | ICD-10-CM

## 2015-04-18 DIAGNOSIS — Z8542 Personal history of malignant neoplasm of other parts of uterus: Secondary | ICD-10-CM

## 2015-04-18 DIAGNOSIS — C541 Malignant neoplasm of endometrium: Secondary | ICD-10-CM

## 2015-04-18 DIAGNOSIS — Z86000 Personal history of in-situ neoplasm of breast: Secondary | ICD-10-CM

## 2015-04-18 DIAGNOSIS — Z9221 Personal history of antineoplastic chemotherapy: Secondary | ICD-10-CM

## 2015-04-18 NOTE — Patient Instructions (Signed)
Call in November 2016 to schedule an appointment with Dr. Denman George or Dr. Alycia Rossetti in January 2017. If you have any concerns before then, please call us at [phone].

## 2015-04-18 NOTE — Telephone Encounter (Signed)
Told Laurie Benson the results of her labs as noted below by Dr. Marko Plume while at [facility] for appointment with Dr. Denman George today 04-18-15.

## 2015-04-18 NOTE — Progress Notes (Signed)
Consult Note: Gyn-Onc  Laurie Benson 49 y.o. female  CC:  Chief Complaint  Patient presents with  . 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 The patient reports that in the past 12 months her previously regular menses have become heavy and irregular. This prompted Dr Marvel Plan to perform an ultrasound of the pelvis on 07/27/14 which revealed a uterus measuring 8x5x5cm with a 2cm intramural fibroid, and normal appearing ovaries. The endometrial thickness was 2cm. 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 She also reports a history of multiple colonic polyps and has had several resections of these. She is recommended to continue annual colonoscopies.   She underwent TRH/BSO and appropriate staging at [facility] on 09/15/14.   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%) (see comment)   Histologic grade: FIGO grade 3 (endometrioid adenocarcinoma: FIGO grade 2; serous carcinoma FIGO grade 3)  Tumor site: endometrium, anterior and posterior   Tumor size (gross): 6.5 x 2.8 x 0.8 cm   Myometrial invasion: Inner half  Depth: 2 mm Wall thickness: 2.2 cm Percent: 9% (A12)  Serosal involvement: not identified   Lower uterine segment involvement: not identified   Cervical involvement: not identified   Adnexal involvement: not  identified   Other involved sites: not applicable   Cervical/vaginal margin and distance: widely negative (>3 cm)   Lymphovascular space invasion: not identified   Regional lymph nodes (see other specimens): Total number involved: 0  Total number examined: 11  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 She was dispositioned to taxol and carboplatin x 6 cycles and vaginal brachytherapy. Her tumor was tested for microsatellite instability. The tumor was MSH-6 negative, MSH-2 positive, PMS-2 positive, and MLH-1 positive. 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   She completed chemotherapy from 12/15 through 02/15/15. She completed vaginal brachytherapy from 01/24/15 through 4-19/16.  She tolerated treatments well.  CT scan of the abdomen and pelvis on 04/16/15 revealed no evidence of persistent or recurrent or metastatic disease.   Interval History:  She has been doing well with no specific complaints. No vaginal bleeding. Initially had vaginal irritation after stopping RT, but now better.  Review of Systems  Current Meds:  Outpatient Encounter Prescriptions as of 04/18/2015  Medication Sig  . acyclovir ointment (ZOVIRAX) 5 % Apply to fever blister 5 x daily.  . Alum & Mag Hydroxide-Simeth (MAGIC MOUTHWASH W/LIDOCAINE) SOLN Swish and spit 5 mls four times per day (Patient not taking: Reported on 02/12/2015)  . ARIPiprazole (ABILIFY) 20 MG tablet Take 20 mg by mouth daily.  Marland Kitchen aspirin-acetaminophen-caffeine (EXCEDRIN MIGRAINE) 250-250-65 MG per tablet Take 1 tablet by mouth every 6 (six) hours as needed.  . Choline Fenofibrate (FENOFIBRIC ACID) 135 MG CPDR Take 135 mg by mouth daily.   . clonazePAM (KLONOPIN) 2 MG tablet Take 2 mg by mouth 3 (three) times daily.   Marland Kitchen docusate sodium (COLACE) 100 MG capsule Take 1  capsule (100 mg total) by mouth 2 (two) times daily.  . ferrous fumarate (HEMOCYTE - 106 MG FE) 325 (106 FE) MG TABS tablet  Take 1 tablet (106 mg of iron total) by mouth daily. Take 1 tablet by mouth daily on empty stomach with orange juice.  Marland Kitchen FLUoxetine (PROZAC) 40 MG capsule Take 40 mg by mouth 2 (two) times daily.   . folic acid (FOLVITE) 170 MCG tablet Take 400 mcg by mouth daily.  Marland Kitchen ibuprofen (ADVIL,MOTRIN) 600 MG tablet Take 1 tablet (600 mg total) by mouth every 6 (six) hours as needed for moderate pain.  Marland Kitchen lidocaine-prilocaine (EMLA) cream Apply 1 application topically as needed. APPLY TO PORT A CATH ONE TO TWO HOURS PRIOR TO TREATMENT.  Marland Kitchen ondansetron (ZOFRAN-ODT) 8 MG disintegrating tablet Take 8 mg by mouth. Takes one before and one after chemo  . pravastatin (PRAVACHOL) 40 MG tablet Take 40 mg by mouth daily.   . prochlorperazine (COMPAZINE) 10 MG tablet Take 1 tablet (10 mg total) by mouth every 6 (six) hours as needed for nausea or vomiting. (Patient not taking: Reported on 02/12/2015)  . QUEtiapine (SEROQUEL) 100 MG tablet Take 100 mg by mouth at bedtime.  . thiamine (VITAMIN B-1) 100 MG tablet Take 100 mg by mouth daily.  . traMADol (ULTRAM) 50 MG tablet Take 1-2 tablets by mouth every 6 hours as needed for pain. (Patient not taking: Reported on 03/26/2015)  . vitamin B-12 (CYANOCOBALAMIN) 1000 MCG tablet Take 1,000 mcg by mouth daily.   No facility-administered encounter medications on file as of 04/18/2015.    Allergy:  Allergies  Allergen Reactions  . Tetracyclines & Related Swelling    Social Hx:   History   Social History  . Marital Status: Legally Separated    Spouse Name: N/A  . Number of Children: 6  . Years of Education: N/A   Occupational History  . Not on file.   Social History Main Topics  . Smoking status: Heavy Tobacco Smoker -- 1.00 packs/day for 14 years    Types: Cigarettes  . Smokeless tobacco: Never Used  . Alcohol Use: Yes  . Drug Use: No  . Sexual Activity: Not Currently   Other Topics Concern  . Not on file   Social History Narrative    Past Surgical Hx:   Past Surgical History  Procedure Laterality Date  . Breast surgery  2012    bilat mastectomy-rt snbx  . Tubal ligation    . Cesarean section      x2  . Multiple tooth extractions    . Breast reconstruction  11/20/2011    Procedure: BREAST RECONSTRUCTION;  Surgeon: Theodoro Kos, DO;  Location: [facility];  Service: Plastics;  Laterality: Bilateral;  bilateral implant exchange for breast reconstruction and capsulectomy  . Robotic assisted laparoscopic hysterectomy and salpingectomy  09/15/14    at [facility] by Dr. Rogelio Seen  . Pelvic and para-aortic lymph node dissection  09/15/14    Past Medical Hx:  Past Medical History  Diagnosis Date  . Anxiety   . Depression   . Complication of anesthesia     heard talking during teeth extraction  . Family history of colon cancer   . Family history of brain cancer   . Radiation 01/27/15, 01/30/15, 02/06/15, 02/13/15, 02/20/15    proximal vagina 30 gray  . Breast cancer   . Cancer 2012    bilat br ca  . Uterine cancer   . Lynch syndrome  MSH6 mutation    Oncology Hx:    Breast cancer   11/20/2011 Initial Diagnosis Breast cancer    Endometrial cancer   08/31/2014 Initial Diagnosis Endometrial cancer   09/15/2014 Surgery TRH/BSO and staging. IA UPSC    Chemotherapy     Family Hx:  Family History  Problem Relation Age of Onset  . Colon cancer Father 67  . Brain cancer Paternal Uncle 54  . Cancer Paternal Grandmother     cancer of her "eyes"    Vitals:  Blood pressure 103/71, pulse 103, temperature 98.8 F (37.1 C), resp. rate 18, height 5' 3"  (1.6 m), weight 131 lb 11.2 oz (59.739 kg), last menstrual period 09/04/2014.  Physical Exam: Well-nourished well-developed female in no acute distress. She seems slightly sedated today.  Abdomen: Well-healed surgical incisions. Abdomen is soft and nontender.  Pelvic: Normal female genitalia. The vaginal cuff is visualized and completely healed. There are no masses. There is no  tenderness.   Assessment/Plan: [name] 49 y.o. 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 She also carries a diagnosis of Lynch syndrome.    She is NED on today's exam and imaging.  I am recommending continuing q 3 monthly evaluations as part of surveillance. She will next see Dr Marko Plume, then Dr Sondra Come in October and myself in January, 2017.  I counseled her about concerning symptoms for recurrence and that she should notify my office if these appear before his scheduled visit.  Donaciano Eva, MD 04/18/2015, 3:35 PM

## 2015-04-18 NOTE — Telephone Encounter (Signed)
-----   Message from Gordy Levan, MD sent at 04/17/2015  3:49 PM EDT ----- Labs seen and need follow up: when she sees Dr Denman George on 6-15, please let her know that chemistries and blood counts all better from 6-13   Thank you

## 2015-04-20 ENCOUNTER — Ambulatory Visit: Payer: Medicare Other | Admitting: Gynecologic Oncology

## 2015-04-30 DIAGNOSIS — Z1501 Genetic susceptibility to malignant neoplasm of breast: Secondary | ICD-10-CM

## 2015-04-30 HISTORY — DX: Genetic susceptibility to malignant neoplasm of breast: Z15.01

## 2015-05-07 ENCOUNTER — Other Ambulatory Visit: Payer: Self-pay | Admitting: Oncology

## 2015-05-07 DIAGNOSIS — C541 Malignant neoplasm of endometrium: Secondary | ICD-10-CM

## 2015-05-07 DIAGNOSIS — Z1509 Genetic susceptibility to other malignant neoplasm: Secondary | ICD-10-CM

## 2015-05-07 DIAGNOSIS — C50911 Malignant neoplasm of unspecified site of right female breast: Secondary | ICD-10-CM

## 2015-05-08 ENCOUNTER — Other Ambulatory Visit (HOSPITAL_BASED_OUTPATIENT_CLINIC_OR_DEPARTMENT_OTHER): Payer: Medicare Other

## 2015-05-08 ENCOUNTER — Ambulatory Visit (HOSPITAL_BASED_OUTPATIENT_CLINIC_OR_DEPARTMENT_OTHER): Payer: Medicare Other | Admitting: Oncology

## 2015-05-08 ENCOUNTER — Encounter: Payer: Self-pay | Admitting: Oncology

## 2015-05-08 ENCOUNTER — Ambulatory Visit (HOSPITAL_BASED_OUTPATIENT_CLINIC_OR_DEPARTMENT_OTHER): Payer: Medicare Other

## 2015-05-08 ENCOUNTER — Telehealth: Payer: Self-pay | Admitting: Oncology

## 2015-05-08 VITALS — BP 158/72 | HR 99 | Temp 98.6°F | Resp 18 | Ht 63.0 in | Wt 128.2 lb

## 2015-05-08 DIAGNOSIS — C541 Malignant neoplasm of endometrium: Secondary | ICD-10-CM

## 2015-05-08 DIAGNOSIS — Z720 Tobacco use: Secondary | ICD-10-CM

## 2015-05-08 DIAGNOSIS — Z1509 Genetic susceptibility to other malignant neoplasm: Secondary | ICD-10-CM

## 2015-05-08 DIAGNOSIS — Z95828 Presence of other vascular implants and grafts: Secondary | ICD-10-CM

## 2015-05-08 DIAGNOSIS — C50911 Malignant neoplasm of unspecified site of right female breast: Secondary | ICD-10-CM

## 2015-05-08 DIAGNOSIS — T451X5A Adverse effect of antineoplastic and immunosuppressive drugs, initial encounter: Secondary | ICD-10-CM

## 2015-05-08 DIAGNOSIS — K635 Polyp of colon: Secondary | ICD-10-CM | POA: Diagnosis not present

## 2015-05-08 DIAGNOSIS — Z853 Personal history of malignant neoplasm of breast: Secondary | ICD-10-CM | POA: Diagnosis not present

## 2015-05-08 DIAGNOSIS — D0511 Intraductal carcinoma in situ of right breast: Secondary | ICD-10-CM

## 2015-05-08 DIAGNOSIS — G620 Drug-induced polyneuropathy: Secondary | ICD-10-CM

## 2015-05-08 LAB — COMPREHENSIVE METABOLIC PANEL (CC13)
ALT: 7 U/L (ref 0–55)
AST: 11 U/L (ref 5–34)
Albumin: 4 g/dL (ref 3.5–5.0)
Alkaline Phosphatase: 41 U/L (ref 40–150)
Anion Gap: 10 mEq/L (ref 3–11)
BUN: 8.2 mg/dL (ref 7.0–26.0)
CALCIUM: 9.4 mg/dL (ref 8.4–10.4)
CO2: 24 mEq/L (ref 22–29)
Chloride: 111 mEq/L — ABNORMAL HIGH (ref 98–109)
Creatinine: 0.9 mg/dL (ref 0.6–1.1)
EGFR: 76 mL/min/{1.73_m2} — AB (ref >90)
GLUCOSE: 123 mg/dL (ref 70–140)
Potassium: 3.5 mEq/L (ref 3.5–5.1)
Sodium: 145 mEq/L (ref 136–145)
TOTAL PROTEIN: 6.8 g/dL (ref 6.4–8.3)
Total Bilirubin: 0.3 mg/dL (ref 0.20–1.20)

## 2015-05-08 LAB — CBC WITH DIFFERENTIAL/PLATELET
BASO%: 0.8 % (ref 0.0–2.0)
Basophils Absolute: 0 10*3/uL (ref 0.0–0.1)
EOS%: 1.7 % (ref 0.0–7.0)
Eosinophils Absolute: 0.1 10*3/uL (ref 0.0–0.5)
HEMATOCRIT: 35.5 % (ref 34.8–46.6)
HGB: 11.9 g/dL (ref 11.6–15.9)
LYMPH#: 1.6 10*3/uL (ref 0.9–3.3)
LYMPH%: 30.5 % (ref 14.0–49.7)
MCH: 31.6 pg (ref 25.1–34.0)
MCHC: 33.5 g/dL (ref 31.5–36.0)
MCV: 94.4 fL (ref 79.5–101.0)
MONO#: 0.2 10*3/uL (ref 0.1–0.9)
MONO%: 3.9 % (ref 0.0–14.0)
NEUT#: 3.3 10*3/uL (ref 1.5–6.5)
NEUT%: 63.1 % (ref 38.4–76.8)
Platelets: 252 10*3/uL (ref 145–400)
RBC: 3.76 10*6/uL (ref 3.70–5.45)
RDW: 14.2 % (ref 11.2–14.5)
WBC: 5.2 10*3/uL (ref 3.9–10.3)

## 2015-05-08 MED ORDER — HEPARIN SOD (PORK) LOCK FLUSH 100 UNIT/ML IV SOLN
500.0000 [IU] | Freq: Once | INTRAVENOUS | Status: AC
  Administered 2015-05-08: 500 [IU] via INTRAVENOUS
  Filled 2015-05-08: qty 5

## 2015-05-08 MED ORDER — SODIUM CHLORIDE 0.9 % IJ SOLN
10.0000 mL | INTRAMUSCULAR | Status: DC | PRN
  Administered 2015-05-08: 10 mL via INTRAVENOUS
  Filled 2015-05-08: qty 10

## 2015-05-08 NOTE — Telephone Encounter (Signed)
Appointments made and av printed for patient

## 2015-05-08 NOTE — Patient Instructions (Signed)

## 2015-05-08 NOTE — Progress Notes (Signed)
OFFICE PROGRESS NOTE   May 08, 2015   Physicians:Paola Gehrig/ [name], Gerald Stabs RIchardson (gyn [place]), Erroll Luna, Louretta Parma [place], PCP [facility], Dr Melina Copa (GI in [place]), Kennyth Lose (psychiatry, [facility] in [place]), Gery Pray  INTERVAL HISTORY:  Patient is seen, together with son, in continuing attention to IA serous (30%)/ endometrioid (70%) carcinoma of uterus for which she has been on observation since completing adjuvant chemotherapy 02-15-15 and vagnal brachytherapy 02-20-15.  She has history of DCIS right breast 2012 post bilateral mastectomies and reconstructions. She has recent diagnosis of Lynch syndrome. Last colonoscopy by Dr Melina Copa was 02-28-15; I am not clear if 1 year or 3 year follow up was recommended. I have requested HIM obtain copy of the colonoscopy report.   Patient has felt gradually stronger since completion of chemotherapy. Peripheral neuropathy in hands and feet has resolved. She has new problem of "gagging with solid foods" , is able to drink liquids easily, feels appetite is not great. She describes eating a few chicken tenders last pm without difficulty, then gagging on the same food. She denies pain on swallowing, actual nausea or increased GERD.  She reports stopping all psychiatric meds completely for 2 weeks, at which point she became very depressed and resumed her medication. She has visit upcoming with psychaitrist Dr Lucy Antigua, who did not know that she had stopped meds.  She continues to smoke, now a little less than 1 ppd, previously 1.5 ppd.     PAC in, flushed today Lynch syndrome  Son stopped smoking while in rehab recently. Patient is smoking outside only since he stopped smoking.  Patient is drinking coffee with ice cream during visit. Daughter and baby Bland Span have moved to [place].   ONCOLOGIC HISTORY ENDOMETRIAL CARCINOMA:Patient had heavy vaginal bleeding perhaps a year ago, seen by gyn in [place] and  recalls that PAP was ok. She continued to have heavy menses and spotting between periods, seen back by Dr Ellouise Newer with ultrasound 07-27-14 which showed uterus 8x5x5 cm and biopsy, with 2 cm endometrial thickness and 2 cm intramural fibroid, normal appearing ovaries. Endometrial biopsy by Dr Marvel Plan 08-17-14 revealed FIGO grade 2 endometrial adenocarcinoma, with immunostains for p53 and p16 focally + supporting possible serous papillary component. She was seen in consultation by Dr Denman George on 08-31-14, with CT scheduled and recommendation for surgery followed possibly by adjuvant therapy. Genetics testing was also recommended, with concern for Lynch syndrome. CT AP 09-04-14 had LLL pulmonary scaring with lung bases otherwise clear, 1.6 cm hypodense lesion inferior posterior right hepatic lobe of unclear etiology, no adenopathy, no free fluid, ovaries normal, inhomogeneity of endometrium. Surgery was at [facility] 09-15-14 by Dr Alycia Rossetti, robotic assisted total laparoscopic hysterectomy, BSO, bilateral pelvic and para-arotic lymphednectomy. Pathology [facility] [phone]) from 09-15-14 found mixed adenocarcinoma ( 70% endometroid grade 3 and 30% serous grade 2) involving endometrium, with total size 6.5 x 2.8 x 0.8 cm and 2 mm myometrial invasion where wall 2.2 cm thick (9%). There was no involvement of serosa, lower uterine segment, cervix, adnexa or ovaries. There was no LVSI. Nodes: 3 right periaortic, no identified left periaortic (due to anatomy irregular on left at time of surgery), 5 right pelvic and 3 left pelvic nodes negative. ER + 90%. Pathologic stage IA. Operative note does not describe posterior liver region. Her case was discussed at [place] at [facility] on 10-04-14, with recommendation for adjuvant carboplatin taxol, and vaginal brachytherapy. 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 Carbo taxol given x 6 cycles from12-30-15 thru 02-15-15, with neulasta support. Radiation by Dr Sondra Come: Radiation treatment dates: 01/24/2015, 01/30/2015, 02/06/2015, 02/13/2015, 02/20/2015 Site/dose: Proximal vagina, 4 cm treatment length, 6 gray to the mucosal surface total dose 30 gray Beams/energy: Iridium 192 as the high-dose-rate source, 3 cm vaginal cylinder  [facility] SYNDROME documented by genetics testing sent 11-2014.   DCIS RIGHT BREAST post right mastectomy with sentinel node and prophylactic left mastectomy 12-2010, with bilateral reconstructions. No genetic concerns on Breast Next panel also sent 11-2014.    Review of systems as above, also: Hair is growing back. Bowels moving regularly. No bleeding. No LE swelling. No abdominal or pelvic pain. Symptoms of vaginal yeast infection with HDR have resolved. Remainder of 10 point Review of Systems negative.  Objective:  Vital signs in last 24 hours:  BP 158/72 mmHg  Pulse 99  Temp(Src) 98.6 F (37 C) (Oral)  Resp 18  Ht _0  (1.6 m)  Wt 128 lb 3.2 oz (58.151 kg)  BMI 22.72 kg/m2  LMP 09/04/2014 Weight down 3 lbs.  Looks comfortable, very pleasant as always. Alert, oriented and appropriate. Ambulatory without difficulty.  Hair is short, no wig  HEENT:PERRL, sclerae not icteric. Oral mucosa moist without lesions, posterior pharynx clear.  Neck supple. No JVD.  Lymphatics:no cervical,supraclavicular, axillary or inguinal adenopathy Resp: clear to auscultation bilaterally and normal percussion bilaterally Cardio: regular rate and rhythm. No gallop. GI: abdomen soft, nontender, not distended, no mass or organomegaly. Normally active bowel sounds. Surgical incision not remarkable. Musculoskeletal/ Extremities: without pitting edema, cords, tenderness Neuro: no peripheral neuropathy. Otherwise nonfocal   Psych: appropriate mood and affect Skin without rash, ecchymosis, petechiae Portacath-without  erythema or tenderness  Lab Results:  Results for orders placed or performed in visit on 05/08/15  CBC with Differential  Result Value Ref Range   WBC 5.2 3.9 - 10.3 10e3/uL   NEUT# 3.3 1.5 - 6.5 10e3/uL   HGB 11.9 11.6 - 15.9 g/dL   HCT 35.5 34.8 - 46.6 %   Platelets 252 145 - 400 10e3/uL   MCV 94.4 79.5 - 101.0 fL   MCH 31.6 25.1 - 34.0 pg   MCHC 33.5 31.5 - 36.0 g/dL   RBC 3.76 3.70 - 5.45 10e6/uL   RDW 14.2 11.2 - 14.5 %   lymph# 1.6 0.9 - 3.3 10e3/uL   MONO# 0.2 0.1 - 0.9 10e3/uL   Eosinophils Absolute 0.1 0.0 - 0.5 10e3/uL   Basophils Absolute 0.0 0.0 - 0.1 10e3/uL   NEUT% 63.1 38.4 - 76.8 %   LYMPH% 30.5 14.0 - 49.7 %   MONO% 3.9 0.0 - 14.0 %   EOS% 1.7 0.0 - 7.0 %   BASO% 0.8 0.0 - 2.0 %  Comprehensive metabolic panel (Cmet) - [facility]  Result Value Ref Range   Sodium 145 136 - 145 mEq/L   Potassium 3.5 3.5 - 5.1 mEq/L   Chloride 111 (H) 98 - 109 mEq/L   CO2 24 22 - 29 mEq/L   Glucose 123 70 - 140 mg/dl   BUN 8.2 7.0 - 26.0 mg/dL   Creatinine 0.9 0.6 - 1.1 mg/dL   Total Bilirubin 0.30 0.20 - 1.20 mg/dL   Alkaline Phosphatase 41 40 - 150 U/L   AST 11 5 - 34 U/L   ALT 7 0 - 55 U/L   Total Protein 6.8 6.4 - 8.3 g/dL  Albumin 4.0 3.5 - 5.0 g/dL   Calcium 9.4 8.4 - 10.4 mg/dL   Anion Gap 10 3 - 11 mEq/L   EGFR 76 (L) >90 ml/min/1.73 m2     Studies/Results:  No results found.  Medications: I have reviewed the patient's current medications, listed as previously including clonopin,, prozac, seroquel. OK to stop iron.  DISCUSSION: Tobacco cessation counseling, with son present.Patient is interested in stopping smoking.  history of the gagging with solid foods not clear, however I have told her that she should call Dr Carmie End office if this does not resolve in ~ 2 weeks. Dr Sondra Come is to see her in October and Dr Denman George in Jan; I will coordinate my visits with PAC flushes for now.  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  Vaginal brachytherapy completed 02-20-15. Alternating visits with gyn oncology and radiation oncology.  2.Lynch syndrome, with MSH6 documented from testing sent 11-2014. Continue to encourage testing of family members, which our genetics counseling department is glad to facilitate. Note she has 6 children and ~ 11 grands, 2 children in [place] and others out of state and overseas. Note family history of colon cancer in father who died age 88, brain cancer paternal uncle in his 22s. 3.colon polyps: New diagnosis of Lynch syndrome, already followed with colonoscopies by Dr Melina Copa.WIll request colonoscopy report from 02-28-15, per patient 3 year follow up recommended.  4.personal hx DCIS right breast 2012, treated with right mastectomy and prophylactic left mastectomy with bilateral reconstruction, no radiation and no hormonal intervention 5..ongoing tobacco, which she has begun to address 6..Chemo anemia and thrombocytopenia: all counts back in normal range now 7..bipolar 2 disorder (hypomanic) with history of PTSD and anxiety, followed by psychiatrist at [facility], on multiple medications, which limited choice of antiemetics with chemo, but did well with compazine and EMEND  8. PAC in: will keep at least for next several months. 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 Patient expresses appreciation for care and knows that she can call at any time if concerns prior to scheduled appointments. Time spent 25 min including >50% counseling and coordination of care.  Cc Drs Melina Copa and Lucy Antigua.      Kamarie Veno P, MD   05/08/2015, 3:51 PM

## 2015-05-09 ENCOUNTER — Encounter: Payer: Self-pay | Admitting: Oncology

## 2015-05-09 NOTE — Progress Notes (Signed)
[place] END OF TREATMENT   Name: Laurie Benson Date: May 09, 2015  MRN: [id number] DOB: [birth year]   TREATMENT DATES: 11-01-14 thru 02-15-15   REFERRING PHYSICIAN: Nancy Marus  DIAGNOSIS: serous/ endometrioid carcinoma of uterus  STAGE AT START OF TREATMENT: IA   INTENT: curative   DRUGS OR REGIMENS GIVEN: carboplatin taxol x 6 cycles   MAJOR TOXICITIES: chemo neutropenia, anemia and thrombocytopenia. Peripheral neuropathy. Taxol aches   REASON TREATMENT STOPPED: completion of planned course   PERFORMANCE STATUS AT END: 1   ONGOING PROBLEMS: mild cytopenias   FOLLOW UP PLANS: follow up gyn onc, rad onc, medical onc

## 2015-05-10 ENCOUNTER — Other Ambulatory Visit: Payer: Self-pay

## 2015-05-10 DIAGNOSIS — B001 Herpesviral vesicular dermatitis: Secondary | ICD-10-CM

## 2015-05-10 MED ORDER — ACYCLOVIR 5 % EX OINT: TOPICAL_OINTMENT | CUTANEOUS | Status: DC

## 2015-05-14 ENCOUNTER — Encounter: Payer: Self-pay | Admitting: Oncology

## 2015-05-14 NOTE — Progress Notes (Signed)
Medical Oncology  Colonoscopy report received from Dr Nehemiah Settle dated 02-28-15 and will be scanned into this EMR.  Pathology report also received and will be scanned into EMR [facility] diagnostics, [phone] from 02-28-15) sessile serrated polyp, no dysplasia or malignancy.  "last colonoscopy 1 year ago, previous polyp with high grade dysplasia"  8 mm sessile polyp, polypectomy with snare. Diverticulosis of sigmoid colon  "Plan colonoscopy in 3 years."    L.Marko Plume, MD

## 2015-06-28 ENCOUNTER — Telehealth: Payer: Self-pay | Admitting: Oncology

## 2015-06-28 NOTE — Telephone Encounter (Signed)
Returned Advertising account executive. Patient moved appointment from 08/29 to 09/15, patient confirmed

## 2015-07-02 ENCOUNTER — Other Ambulatory Visit: Payer: Medicare Other

## 2015-07-02 ENCOUNTER — Ambulatory Visit: Payer: Medicare Other | Admitting: Oncology

## 2015-07-09 ENCOUNTER — Other Ambulatory Visit: Payer: Self-pay | Admitting: Oncology

## 2015-07-09 DIAGNOSIS — C50911 Malignant neoplasm of unspecified site of right female breast: Secondary | ICD-10-CM

## 2015-07-09 DIAGNOSIS — Z1509 Genetic susceptibility to other malignant neoplasm: Secondary | ICD-10-CM

## 2015-07-09 DIAGNOSIS — C541 Malignant neoplasm of endometrium: Secondary | ICD-10-CM

## 2015-07-18 ENCOUNTER — Other Ambulatory Visit: Payer: Self-pay | Admitting: Oncology

## 2015-07-19 ENCOUNTER — Telehealth: Payer: Self-pay | Admitting: Oncology

## 2015-07-19 ENCOUNTER — Other Ambulatory Visit (HOSPITAL_BASED_OUTPATIENT_CLINIC_OR_DEPARTMENT_OTHER): Payer: Medicare Other

## 2015-07-19 ENCOUNTER — Encounter: Payer: Self-pay | Admitting: Oncology

## 2015-07-19 ENCOUNTER — Ambulatory Visit (HOSPITAL_BASED_OUTPATIENT_CLINIC_OR_DEPARTMENT_OTHER): Payer: Medicare Other

## 2015-07-19 ENCOUNTER — Ambulatory Visit (HOSPITAL_BASED_OUTPATIENT_CLINIC_OR_DEPARTMENT_OTHER): Payer: Medicare Other | Admitting: Oncology

## 2015-07-19 VITALS — BP 108/51 | HR 72 | Temp 99.0°F | Resp 18 | Ht 63.0 in | Wt 132.9 lb

## 2015-07-19 DIAGNOSIS — C541 Malignant neoplasm of endometrium: Secondary | ICD-10-CM | POA: Diagnosis present

## 2015-07-19 DIAGNOSIS — D0511 Intraductal carcinoma in situ of right breast: Secondary | ICD-10-CM

## 2015-07-19 DIAGNOSIS — Z23 Encounter for immunization: Secondary | ICD-10-CM

## 2015-07-19 DIAGNOSIS — Z1509 Genetic susceptibility to other malignant neoplasm: Secondary | ICD-10-CM | POA: Diagnosis not present

## 2015-07-19 DIAGNOSIS — C50911 Malignant neoplasm of unspecified site of right female breast: Secondary | ICD-10-CM

## 2015-07-19 DIAGNOSIS — Z720 Tobacco use: Secondary | ICD-10-CM

## 2015-07-19 DIAGNOSIS — Z95828 Presence of other vascular implants and grafts: Secondary | ICD-10-CM

## 2015-07-19 DIAGNOSIS — D500 Iron deficiency anemia secondary to blood loss (chronic): Secondary | ICD-10-CM | POA: Diagnosis not present

## 2015-07-19 DIAGNOSIS — F17209 Nicotine dependence, unspecified, with unspecified nicotine-induced disorders: Secondary | ICD-10-CM

## 2015-07-19 LAB — COMPREHENSIVE METABOLIC PANEL (CC13)
ALBUMIN: 3.8 g/dL (ref 3.5–5.0)
ALK PHOS: 48 U/L (ref 40–150)
ALT: 30 U/L (ref 0–55)
ANION GAP: 8 meq/L (ref 3–11)
AST: 22 U/L (ref 5–34)
BILIRUBIN TOTAL: 0.46 mg/dL (ref 0.20–1.20)
BUN: 11.1 mg/dL (ref 7.0–26.0)
CALCIUM: 9.4 mg/dL (ref 8.4–10.4)
CO2: 27 meq/L (ref 22–29)
CREATININE: 1.1 mg/dL (ref 0.6–1.1)
Chloride: 111 mEq/L — ABNORMAL HIGH (ref 98–109)
EGFR: 61 mL/min/{1.73_m2} — ABNORMAL LOW (ref >90)
Glucose: 98 mg/dl (ref 70–140)
Potassium: 3.7 mEq/L (ref 3.5–5.1)
Sodium: 146 mEq/L — ABNORMAL HIGH (ref 136–145)
TOTAL PROTEIN: 6.4 g/dL (ref 6.4–8.3)

## 2015-07-19 LAB — CBC WITH DIFFERENTIAL/PLATELET
BASO%: 0.7 % (ref 0.0–2.0)
Basophils Absolute: 0 10*3/uL (ref 0.0–0.1)
EOS ABS: 0.3 10*3/uL (ref 0.0–0.5)
EOS%: 5.5 % (ref 0.0–7.0)
HEMATOCRIT: 31.5 % — AB (ref 34.8–46.6)
HEMOGLOBIN: 10.3 g/dL — AB (ref 11.6–15.9)
LYMPH#: 1.5 10*3/uL (ref 0.9–3.3)
LYMPH%: 34.1 % (ref 14.0–49.7)
MCH: 30.7 pg (ref 25.1–34.0)
MCHC: 32.7 g/dL (ref 31.5–36.0)
MCV: 93.8 fL (ref 79.5–101.0)
MONO#: 0.4 10*3/uL (ref 0.1–0.9)
MONO%: 7.8 % (ref 0.0–14.0)
NEUT%: 51.9 % (ref 38.4–76.8)
NEUTROS ABS: 2.3 10*3/uL (ref 1.5–6.5)
Platelets: 223 10*3/uL (ref 145–400)
RBC: 3.36 10*6/uL — AB (ref 3.70–5.45)
RDW: 13.6 % (ref 11.2–14.5)
WBC: 4.5 10*3/uL (ref 3.9–10.3)

## 2015-07-19 MED ORDER — FERROUS FUMARATE 325 (106 FE) MG PO TABS: 1.0000 | ORAL_TABLET | Freq: Every day | ORAL | Status: DC

## 2015-07-19 MED ORDER — INFLUENZA VAC SPLIT QUAD 0.5 ML IM SUSY
0.5000 mL | PREFILLED_SYRINGE | Freq: Once | INTRAMUSCULAR | Status: AC
  Administered 2015-07-19: 0.5 mL via INTRAMUSCULAR
  Filled 2015-07-19: qty 0.5

## 2015-07-19 MED ORDER — HEPARIN SOD (PORK) LOCK FLUSH 100 UNIT/ML IV SOLN
500.0000 [IU] | Freq: Once | INTRAVENOUS | Status: AC
  Administered 2015-07-19: 500 [IU] via INTRAVENOUS
  Filled 2015-07-19: qty 5

## 2015-07-19 MED ORDER — SODIUM CHLORIDE 0.9 % IJ SOLN
10.0000 mL | INTRAMUSCULAR | Status: DC | PRN
  Administered 2015-07-19: 10 mL via INTRAVENOUS
  Filled 2015-07-19: qty 10

## 2015-07-19 MED ORDER — NICOTINE 7 MG/24HR TD PT24
7.0000 mg | MEDICATED_PATCH | Freq: Every day | TRANSDERMAL | Status: DC
Start: 2015-07-19 — End: 2016-07-10

## 2015-07-19 MED ORDER — VITAMIN B-12 1000 MCG PO TABS: 1000.0000 ug | ORAL_TABLET | Freq: Every day | ORAL | Status: DC

## 2015-07-19 NOTE — Telephone Encounter (Signed)
Gave avs & calendar for November. Patient is aware to schedule for Dr.Rossi in January and Dr.[name] 8 weeks after. Patient aware schedule not available.

## 2015-07-19 NOTE — Progress Notes (Signed)
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 She also has diagnosis of Lynch syndrome . 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  Last imaging was CT AP 04-16-15.  Patient saw Dr Denman George (gyn oncology) 04-18-15 and will see Dr Sondra Come (rad onc) 08-16-15; she is to see Dr Denman George again in Jan.  She has PAC in, which will be kept for now, flushed today and every 6-8 weeks when not otherwise used.  Last colonoscopy by Dr Melina Copa 02-28-15 with an 8 mm sessile polyp, repeat planned 3 years. [facility] report scanned into [facility] this EMR, no dysplasia or malignancy.   Patient just returned from trip to [place] to visit daughter. She has been more tired, tho not markedly so. She denies any bleeding. She has more discomfort in hips bilaterally, this x 3 years, seen previously by orthopedist in [place] with bilateral injections not really helpful; she denies low back pain and rarely has numbness in thighs (when driving). Walking distances aggravates hip discomfort. She has not been using OTC NSAID listed on meds, reminded her to take with food if so. She denies abdominal or pelvic discomfort. She has no peripheral neuropathy now in fingers or feet. Appetite good. Bowels normal. No bladder symptoms. No swelling LE. Denies SOB   PAC in flushed today Lynch syndrome Bilateral mastectomies with reconstructions Flu vaccine done  today Patient requests shingles vaccine, can be by PCP or at this office if not done by time of next PAC flush in Nov  ONCOLOGIC [place] had heavy vaginal bleeding perhaps a year ago, seen by gyn in [place] and recalls that PAP was ok. She continued to have heavy menses and spotting between periods, seen back by Dr Ellouise Newer with ultrasound 07-27-14 which showed uterus 8x5x5 cm and biopsy, with 2 cm endometrial thickness and 2 cm intramural fibroid, normal appearing ovaries. Endometrial biopsy by Dr Marvel Plan 08-17-14 revealed FIGO grade 2 endometrial adenocarcinoma, with immunostains for p53 and p16 focally + supporting possible serous papillary component. She was seen in consultation by Dr Denman George on 08-31-14, with CT scheduled and recommendation for surgery followed possibly by adjuvant therapy. Genetics testing was also recommended, with concern for Lynch syndrome. CT AP 09-04-14 had LLL pulmonary scaring with lung bases otherwise clear, 1.6 cm hypodense lesion inferior posterior right hepatic lobe of unclear etiology, no adenopathy, no free fluid, ovaries normal, inhomogeneity of endometrium. Surgery was at [facility] 09-15-14 by Dr Alycia Rossetti, robotic assisted total laparoscopic hysterectomy, BSO, bilateral pelvic and para-arotic lymphednectomy. Pathology [facility] [id number]) from 09-15-14 found mixed adenocarcinoma ( 70% endometroid grade 3 and 30% serous grade 2) involving endometrium, with total size 6.5 x 2.8 x 0.8 cm and 2 mm myometrial invasion where wall 2.2 cm thick (9%). There was no involvement of serosa, lower uterine segment, cervix, adnexa or ovaries. There was no LVSI. Nodes: 3 right periaortic, no identified left periaortic (due to anatomy irregular on left at time of surgery), 5 right pelvic and 3 left pelvic nodes negative. ER + 90%. Pathologic  stage IA. Operative note does not describe posterior liver region. Her case was discussed at [place] at [facility] on 10-04-14, with recommendation for adjuvant carboplatin taxol, and vaginal brachytherapy. 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 Carbo taxol given x 6 cycles from12-30-15 thru 02-15-15, with neulasta support. Radiation by Dr Sondra Come: Radiation treatment dates: 01/24/2015, 01/30/2015, 02/06/2015, 02/13/2015, 02/20/2015 Site/dose: Proximal vagina, 4 cm treatment length, 6 gray to the mucosal surface total dose 30 gray Beams/energy: Iridium 192 as the high-dose-rate source, 3 cm vaginal cylinder  [facility] SYNDROME documented by genetics testing sent 11-2014.   DCIS RIGHT BREAST post right mastectomy with sentinel node and prophylactic left mastectomy 12-2010, with bilateral reconstructions. No genetic concerns on Breast Next panel also sent 11-2014.    Review of systems as above, also: No fever or symptoms of infection. No problems with PAC. Has not taken oral iron in 3 mo. No new or different pain otherwise. Crosses legs constantly. Has decreased cigarettes to 1/2 ppd, habit times with coffee in AM/ in car - discussed. Reports "only 4 drinks in last 2 months".  Remainder of 10 point Review of Systems negative.  Objective:  Vital signs in last 24 hours:  BP 108/51 mmHg  Pulse 72  Temp(Src) 99 F (37.2 C) (Oral)  Resp 18  Ht 5' 3"  (1.6 m)  Wt 132 lb 14.4 oz (60.283 kg)  BMI 23.55 kg/m2  LMP 09/04/2014 Weight up 4 lbs. Alert, oriented and appropriate. Ambulatory without difficulty.  No alopecia  HEENT:PERRL, sclerae not icteric. Oral mucosa moist without lesions, posterior pharynx clear. Dentures Neck supple. No JVD.  Lymphatics:no cervical,supraclavicular, axillary or inguinal adenopathy Resp: clear to auscultation bilaterally and normal percussion bilaterally Cardio: regular rate and rhythm. No gallop. GI: soft, nontender, not distended, no mass  or organomegaly. Normally active bowel sounds. Surgical incisions not remarkable. Musculoskeletal/ Extremities: without pitting edema, cords, tenderness Neuro: no peripheral neuropathy. Otherwise nonfocal Skin without rash, ecchymosis, petechiae Breasts: reconstructions bilaterally without evidence of local recurrence. Axillae benign. Portacath-without erythema or tenderness  Lab Results:  Results for orders placed or performed in visit on 07/19/15  CBC with Differential  Result Value Ref Range   WBC 4.5 3.9 - 10.3 10e3/uL   NEUT# 2.3 1.5 - 6.5 10e3/uL   HGB 10.3 (L) 11.6 - 15.9 g/dL   HCT 31.5 (L) 34.8 - 46.6 %   Platelets 223 145 - 400 10e3/uL   MCV 93.8 79.5 - 101.0 fL   MCH 30.7 25.1 - 34.0 pg   MCHC 32.7 31.5 - 36.0 g/dL   RBC 3.36 (L) 3.70 - 5.45 10e6/uL   RDW 13.6 11.2 - 14.5 %   lymph# 1.5 0.9 - 3.3 10e3/uL   MONO# 0.4 0.1 - 0.9 10e3/uL   Eosinophils Absolute 0.3 0.0 - 0.5 10e3/uL   Basophils Absolute 0.0 0.0 - 0.1 10e3/uL   NEUT% 51.9 38.4 - 76.8 %   LYMPH% 34.1 14.0 - 49.7 %   MONO% 7.8 0.0 - 14.0 %   EOS% 5.5 0.0 - 7.0 %   BASO% 0.7 0.0 - 2.0 %    CMET drawn today and available after visit normal with exception of Na 146 and Cl 111  Studies/Results:  No results found.  Medications: I have reviewed the patient's current medications. Nicotine patches, resume hemocyte, B12, nicotine patches after stops smoking.  Flu vaccine done today  DISCUSSION: Clinically no  concerns related to the endometrial cancer, aware of upcoming appointments with Drs Sondra Come and Denman George. Hemoglobin lower today, level of 11.9 at last visit "highest I have ever had", but no known bleeding and colonoscopy just done in 02-2015. She was iron deficient 02-2015 so will resume Hemocyte and recheck CBC with PAC flush in Nov. Discussed smoking cessation at length  I spoke with [facility] now, established with [facility], contact #s above.  Assessment/Plan: 1.IA grade 3 endometrioid  adenocarcinoma and grade 2 serous carcinoma of endometrium: cycle 6 carbo taxol completed 02-15-15 , with neulasta.  Vaginal brachytherapy completed 02-20-15. Alternating visits with gyn oncology and radiation oncology.  2.Lynch syndrome, with MSH6 documented 11-2014. Continue to encourage testing of family members, which our genetics counseling department is glad to facilitate. Note she has 6 children and ~ 11 grands, 2 children in [place] and others out of state and overseas. Note family history of colon cancer in father who died age 67, brain cancer paternal uncle in his 40s. 3.colon polyps: Diagnosis of Lynch syndrome, already followed with colonoscopies by Dr Melina Copa up to date,  4.personal hx DCIS right breast 2012, treated with right mastectomy and prophylactic left mastectomy with bilateral reconstruction, no radiation and no hormonal intervention 5..ongoing tobacco, which she now wants to stop. Tobacco cessation counseling, nicotine patch 6..Hemoglobin lower today than at last visit,, other counts good. Will recheck iron with next blood work, however she will resume oral iron now. 7..bipolar 2 disorder (hypomanic) with history of PTSD and anxiety, followed by psychiatrist at [facility], on multiple medications, which limited choice of antiemetics with chemo, but did well with compazine and EMEND. She has decreased alcohol. 8. PAC in: will keep at least for next several months. 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 PATIENT IS ILLITERATE 11.Flu vaccine given today. If she does not have shingles vaccine by PCP by time of appointment here for [facility] flush in Nov, ok to give then 12. Bilateral hip pain: evaluated by orthopedics in  in past for arthritis, could also be bursitis. Recommended not crossing legs. Temporary handicap form signed for DMV for 3 mo as she cannot tolerate walking across parking lots  due to hip discomfort, will need PCP or ortho to continue if appropriate.   Time spent 25 min including >50% counseling and coordination of care.Will recheck CBC and other labs with PAC flushes and I will see her coordinating with PAC flush. CC PCP Heide Scales, Dr Melina Copa, Dr Erskine Squibb, MD   07/19/2015, 8:46 AM

## 2015-07-19 NOTE — Patient Instructions (Signed)

## 2015-07-20 ENCOUNTER — Telehealth: Payer: Self-pay | Admitting: Oncology

## 2015-07-20 ENCOUNTER — Encounter: Payer: Self-pay | Admitting: Oncology

## 2015-07-20 NOTE — Progress Notes (Signed)
MEDIC Faxed records to [facility].       Previous Messages     ----- Message -----   From: Gordy Levan, MD   Sent: 07/19/2015  9:18 AM   Please fax my last 3 notes + CBC CMET from 9-15 + med list + Dr Serita Grit note 04-18-15 + Dr Clabe Seal documentation note from RT  To  [place]

## 2015-07-20 NOTE — Telephone Encounter (Signed)
lvm for pt regarding to NOV appt d.t change

## 2015-08-16 ENCOUNTER — Encounter: Payer: Self-pay | Admitting: Radiation Oncology

## 2015-08-16 ENCOUNTER — Other Ambulatory Visit (HOSPITAL_COMMUNITY)
Admission: RE | Admit: 2015-08-16 | Discharge: 2015-08-16 | Disposition: A | Discharge status: Home / Self Care | Payer: Medicare Other | Source: Ambulatory Visit | Attending: Radiation Oncology | Admitting: Radiation Oncology

## 2015-08-16 ENCOUNTER — Telehealth: Payer: Self-pay | Admitting: *Deleted

## 2015-08-16 ENCOUNTER — Ambulatory Visit
Admission: RE | Admit: 2015-08-16 | Discharge: 2015-08-16 | Disposition: A | Discharge status: Home / Self Care | Payer: Medicare Other | Source: Ambulatory Visit | Attending: Radiation Oncology | Admitting: Radiation Oncology

## 2015-08-16 VITALS — BP 134/68 | HR 79 | Temp 98.3°F | Ht 63.0 in | Wt 135.3 lb

## 2015-08-16 DIAGNOSIS — Z01411 Encounter for gynecological examination (general) (routine) with abnormal findings: Secondary | ICD-10-CM | POA: Diagnosis present

## 2015-08-16 DIAGNOSIS — C541 Malignant neoplasm of endometrium: Secondary | ICD-10-CM

## 2015-08-16 MED ORDER — OXYCODONE-ACETAMINOPHEN 5-325 MG PO TABS: 1.0000 | ORAL_TABLET | ORAL | Status: DC | PRN

## 2015-08-16 NOTE — Progress Notes (Signed)
Radiation Oncology         [phone] ________________________________  Name: Laurie Benson MRN: [id number]  Date: 08/16/2015  DOB: Sep 15, 1966  Follow-Up Visit Note  CC: Pcp Not In System  No ref. provider found    ICD-9-CM ICD-10-CM   1. Endometrial cancer (HCC) 182.0 C54.1     Diagnosis:   FIGO Stage IA Mixed subtype endometroid (70%) and serous carcinoma(30%)   Interval Since Last Radiation:  01/24/2015, 01/30/2015, 02/06/2015, 02/13/2015, 02/20/2015 Site/dose:   Proximal vagina, 4 cm treatment length, 6 gray to the mucosal surface total dose 30 gray  Narrative:  The patient returns today for routine follow-up. She reports having aching pain in her right upper arm and hips. She has had this pain for about 5 months and is requesting a referral to orthopedics in [place], who she has seen in the past for injections. She reports that she stopped seeing that physician once she was diagnosed with cancer. She reports pain in the right lateral chest and numbness in her right axillary region since her surgery. She reports having bright red rectal bleeding, once, a week ago. She reports constipation. She denies having bladder issues or vaginal bleeding/discharge. She is not using her vaginal dilator. She denies fatigue and reports having a good appetite. She reports smoking a 1/2 ppd. Her last visit with Dr. Marko Plume was on 07/19/15 and Dr. Denman George on 04/18/15. The pt's most recent abdominal/pelvic CT was on 04/16/15 with NED.  ALLERGIES:  is allergic to tetracyclines & related.  Meds: Current Outpatient Prescriptions  Medication Sig Dispense Refill  . ARIPiprazole (ABILIFY) 20 MG tablet Take 20 mg by mouth daily.    Marland Kitchen aspirin-acetaminophen-caffeine (EXCEDRIN MIGRAINE) 250-250-65 MG per tablet Take 1 tablet by mouth every 6 (six) hours as needed.    . Choline Fenofibrate (FENOFIBRIC ACID) 135 MG CPDR Take 135 mg by mouth daily.     . clonazePAM (KLONOPIN) 2 MG tablet Take 2 mg by mouth 3 (three)  times daily.     . [facility] ASPIRIN EC 325 MG EC tablet Take 325 mg by mouth daily.  0  . docusate sodium (COLACE) 100 MG capsule Take 1 capsule (100 mg total) by mouth 2 (two) times daily. 60 capsule 2  . ferrous fumarate (HEMOCYTE - 106 MG FE) 325 (106 FE) MG TABS tablet Take 1 tablet (106 mg of iron total) by mouth daily. Take 1 tablet by mouth daily on empty stomach with orange juice. 30 each 4  . FLUoxetine (PROZAC) 40 MG capsule Take 40 mg by mouth 2 (two) times daily.     . folic acid (FOLVITE) 836 MCG tablet Take 400 mcg by mouth daily.    Marland Kitchen lidocaine-prilocaine (EMLA) cream Apply 1 application topically as needed. APPLY TO PORT A CATH ONE TO TWO HOURS PRIOR TO TREATMENT. 30 g 2  . loratadine (CLARITIN) 10 MG tablet Take 10 mg by mouth daily.    . nicotine (NICODERM CQ) 7 mg/24hr patch Place 1 patch (7 mg total) onto the skin daily. 28 patch 4  . QUEtiapine (SEROQUEL) 100 MG tablet Take 100 mg by mouth at bedtime.    . thiamine (VITAMIN B-1) 100 MG tablet Take 100 mg by mouth daily.    . vitamin B-12 (CYANOCOBALAMIN) 1000 MCG tablet Take 1 tablet (1,000 mcg total) by mouth daily. 30 tablet 4  . acyclovir ointment (ZOVIRAX) 5 % Apply to fever blister 5 x daily. (Patient not taking: Reported on 07/19/2015) 30 g 1  .  ibuprofen (ADVIL,MOTRIN) 600 MG tablet Take 1 tablet (600 mg total) by mouth every 6 (six) hours as needed for moderate pain. (Patient not taking: Reported on 07/19/2015) 30 tablet 2   No current facility-administered medications for this encounter.    Physical Findings: The patient is in no acute distress. Patient is alert and oriented.  height is 5\' 3"  (1.6 m) and weight is 135 lb 4.8 oz (61.372 kg). Her oral temperature is 98.3 F (36.8 C). Her blood pressure is 134/68 and her pulse is 79. Her oxygen saturation is 100%.  Lungs are clear to auscultation bilaterally. Heart has regular rate and rhythm. Chest area shows reconstruction with implants in place. No palpable or visible  signs of recurrence . No palpable cervical, supraclavicular, or axillary adenopathy. Abdomen is soft, non-tender, and non-distended. No hepatosplenomegaly. Port sites well healed without nodularity. No inguinal adenopathy. External genitalia non-remarkable. Pelvic Exam: Mild radiation changes in the proximal vagina. No mucosal lesions. No pelvic masses on bimanual and rectovaginal exam. Pap smear was obtained.  Lab Findings: Lab Results  Component Value Date   WBC 4.5 07/19/2015   HGB 10.3* 07/19/2015   HCT 31.5* 07/19/2015   MCV 93.8 07/19/2015   PLT 223 07/19/2015    Radiographic Findings: No results found.  Impression:  Clinically stable. She has completed all of her adjuvant treatment including 6 cycles of carboplatin/Taxol and vaginal brachytherapy. Pap smear pending.  Plan: The pt is scheduled for a f/u with Dr. Marko Plume on 09/17/15 and Dr. Denman George in January 2017. I will make a referral to orthopedics for her hip and left should pain once the patient calls back with the name of her prior orthopedic surgeon. I will also refer the patient back to Dr. Melina Copa of Gastroenterology in [place], [place] for a colonoscopy since the patient has Lynch Syndrome and recent rectal bleeding. She will follow up with radiation oncology in April 2017. I prescribed Percocet 5-325 during this encounter in light of her hip and shoulder pain until she can see her orthopedic surgeon  This document serves as a record of services personally performed by Gery Pray, MD. It was created on his behalf by Darcus Austin, a trained medical scribe. The creation of this record is based on the scribe's personal observations and the provider's statements to them. This document has been checked and approved by the attending provider. ____________________________________  Blair Promise, PhD, MD

## 2015-08-16 NOTE — Progress Notes (Signed)
Laurie Benson here for follow up.  She reports having aching pain in her right upper arm and hips.  She has had this pain for about 5 months and is requesting a referral to orthopedics in [place] who she has seen in the past for injections.  She reports having bright red rectal bleeding once a week ago.  She denies having a bladder issues, vaginal bleeding/discharge.  She is not using her vaginal dilator.  She denies having fatigue and reports having a good appetite.  She reports smoking a 1/2 ppd.  BP 134/68 mmHg  Pulse 79  Temp(Src) 98.3 F (36.8 C) (Oral)  Ht 5\' 3"  (1.6 m)  Wt 135 lb 4.8 oz (61.372 kg)  BMI 23.97 kg/m2  SpO2 100%  LMP 09/04/2014

## 2015-08-16 NOTE — Telephone Encounter (Signed)
CALLED PATIENT TO INFORM OF APPT. WITH DR. [name]- ARRIVAL TIME - 9:15 AM ON 08-23-15, SPOKE WITH PATIENT AND SHE IS AWARE OF THIS APPT.

## 2015-08-21 LAB — CYTOLOGY - PAP

## 2015-08-24 ENCOUNTER — Telehealth: Payer: Self-pay | Admitting: Oncology

## 2015-08-24 NOTE — Telephone Encounter (Signed)
Called [place] and advised her of the good results on her pap smear per Dr. Sondra Come.  Laurie Benson verbalized agreement and understanding.

## 2015-09-14 ENCOUNTER — Telehealth: Payer: Self-pay

## 2015-09-14 ENCOUNTER — Telehealth: Payer: Self-pay | Admitting: Oncology

## 2015-09-14 ENCOUNTER — Other Ambulatory Visit: Payer: Self-pay | Admitting: Oncology

## 2015-09-14 NOTE — Telephone Encounter (Signed)
S/w pt about Dr Edwyna Shell message. Pt did verify appt on Monday at 1230.

## 2015-09-14 NOTE — Telephone Encounter (Signed)
11/14 inj cancelled per pof  anne

## 2015-09-14 NOTE — Telephone Encounter (Signed)
-----   Message from Gordy Levan, MD sent at 09/14/2015 12:46 PM EST ----- Regarding: FW: Zostavax for Laurie Benson Please let her know we will not be able to give shingles vaccine at [facility] - her insurance will cover if she gets it at local pharmacy, just not at [facility] She should still keep MD apt that day thanks ----- Message -----    From: Enis Gash, Interior: 09/14/2015  11:39 AM      To: Tora Kindred, RPH, Gordy Levan, MD Subject: Zostavax for [name] ([id number]) has an injection appt on 11/14. Per your note on 07/19/15, you may be planning to give her Zostavax (for shingles) if she hasn't gotten it from her PCP. There are no orders for an injection, so I am assuming this is was the appt is for.  Due the fact that the patient's primary insurance is Medicare, Zostavax is only covered under medicare Part D. Due to this, we can not administer this vaccine here (and get reimbursed for it). She will have to [facility] a [facility], [facility] to be billed through the patient's part D.  Let me know if you have any questions, Denyse Amass [facility])

## 2015-09-16 ENCOUNTER — Other Ambulatory Visit: Payer: Self-pay | Admitting: Oncology

## 2015-09-17 ENCOUNTER — Other Ambulatory Visit (HOSPITAL_BASED_OUTPATIENT_CLINIC_OR_DEPARTMENT_OTHER): Payer: Medicare Other

## 2015-09-17 ENCOUNTER — Telehealth: Payer: Self-pay | Admitting: Oncology

## 2015-09-17 ENCOUNTER — Encounter: Payer: Self-pay | Admitting: Oncology

## 2015-09-17 ENCOUNTER — Ambulatory Visit (HOSPITAL_BASED_OUTPATIENT_CLINIC_OR_DEPARTMENT_OTHER): Payer: Medicare Other

## 2015-09-17 ENCOUNTER — Ambulatory Visit (HOSPITAL_BASED_OUTPATIENT_CLINIC_OR_DEPARTMENT_OTHER): Payer: Medicare Other | Admitting: Oncology

## 2015-09-17 ENCOUNTER — Ambulatory Visit: Payer: Medicare Other

## 2015-09-17 VITALS — BP 134/57 | HR 71 | Temp 97.9°F | Resp 18 | Ht 63.0 in | Wt 133.6 lb

## 2015-09-17 DIAGNOSIS — Z853 Personal history of malignant neoplasm of breast: Secondary | ICD-10-CM

## 2015-09-17 DIAGNOSIS — Z720 Tobacco use: Secondary | ICD-10-CM | POA: Diagnosis not present

## 2015-09-17 DIAGNOSIS — D500 Iron deficiency anemia secondary to blood loss (chronic): Secondary | ICD-10-CM | POA: Diagnosis not present

## 2015-09-17 DIAGNOSIS — C541 Malignant neoplasm of endometrium: Secondary | ICD-10-CM

## 2015-09-17 DIAGNOSIS — Z95828 Presence of other vascular implants and grafts: Secondary | ICD-10-CM

## 2015-09-17 DIAGNOSIS — Z23 Encounter for immunization: Secondary | ICD-10-CM

## 2015-09-17 DIAGNOSIS — Z1509 Genetic susceptibility to other malignant neoplasm: Secondary | ICD-10-CM

## 2015-09-17 LAB — CBC WITH DIFFERENTIAL/PLATELET
BASO%: 1 % (ref 0.0–2.0)
BASOS ABS: 0.1 10*3/uL (ref 0.0–0.1)
EOS ABS: 0.2 10*3/uL (ref 0.0–0.5)
EOS%: 3 % (ref 0.0–7.0)
HEMATOCRIT: 35.2 % (ref 34.8–46.6)
HEMOGLOBIN: 11.9 g/dL (ref 11.6–15.9)
LYMPH#: 2.4 10*3/uL (ref 0.9–3.3)
LYMPH%: 38 % (ref 14.0–49.7)
MCH: 31 pg (ref 25.1–34.0)
MCHC: 33.8 g/dL (ref 31.5–36.0)
MCV: 91.6 fL (ref 79.5–101.0)
MONO#: 0.3 10*3/uL (ref 0.1–0.9)
MONO%: 5.3 % (ref 0.0–14.0)
NEUT#: 3.3 10*3/uL (ref 1.5–6.5)
NEUT%: 52.7 % (ref 38.4–76.8)
Platelets: 240 10*3/uL (ref 145–400)
RBC: 3.84 10*6/uL (ref 3.70–5.45)
RDW: 13.2 % (ref 11.2–14.5)
WBC: 6.3 10*3/uL (ref 3.9–10.3)

## 2015-09-17 LAB — IRON AND TIBC CHCC
%SAT: 13 % — ABNORMAL LOW (ref 21–57)
Iron: 56 ug/dL (ref 41–142)
TIBC: 423 ug/dL (ref 236–444)
UIBC: 366 ug/dL (ref 120–384)

## 2015-09-17 LAB — FERRITIN CHCC: FERRITIN: 28 ng/mL (ref 9–269)

## 2015-09-17 MED ORDER — SODIUM CHLORIDE 0.9 % IJ SOLN
10.0000 mL | INTRAMUSCULAR | Status: DC | PRN
  Administered 2015-09-17: 10 mL via INTRAVENOUS
  Filled 2015-09-17: qty 10

## 2015-09-17 MED ORDER — ZOSTER VACCINE LIVE 19400 UNT/0.65ML ~~LOC~~ SOLR: 0.6500 mL | Freq: Once | SUBCUTANEOUS | Status: DC

## 2015-09-17 MED ORDER — HEPARIN SOD (PORK) LOCK FLUSH 100 UNIT/ML IV SOLN
500.0000 [IU] | Freq: Once | INTRAVENOUS | Status: AC
  Administered 2015-09-17: 500 [IU] via INTRAVENOUS
  Filled 2015-09-17: qty 5

## 2015-09-17 NOTE — Telephone Encounter (Signed)
Gave  Patient avs report and appointments for January and March 2017.

## 2015-09-17 NOTE — Progress Notes (Signed)
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 She has history of right DCIS post bilateral mastectomies, and Lynch syndrome. She has had colon polyps, is up to date on colonoscopies, followed regularly by Dr Melina Copa.  PAC still in, which she prefers to keep for now. She continues to smoke but is trying to stop, now ~ 6 cigarettes daily + e-cigarette. Discussed.  Patient has felt well since she was here last with exception of bilateral hip discomfort, some better but not resolved with celebrex by PCP. She crosses her legs constantly, which I have reminded her can exacerbate that problem.  She denies bleeding, other pain including abdominal or pelvic pain, no increased SOB or new respiratory symptoms, good appetite, good energy. Remainder of 10 point Review of Systems negative.   PAC in flushed today Lynch syndrome Bilateral mastectomies with reconstructions Flu vaccine done today Patient requests shingles vaccine,  ONCOLOGIC HISTORY ENDOMETRIAL CARCINOMA:Patient had heavy vaginal bleeding perhaps a year ago, seen by gyn in [place] and recalls that PAP was ok. She continued to have heavy menses and spotting between periods, seen back by Dr Ellouise Newer with ultrasound 07-27-14 which showed uterus 8x5x5 cm and biopsy, with 2 cm endometrial thickness and 2 cm intramural fibroid, normal appearing ovaries. Endometrial biopsy by Dr Marvel Plan 08-17-14 revealed FIGO grade 2 endometrial adenocarcinoma, with immunostains  for p53 and p16 focally + supporting possible serous papillary component. She was seen in consultation by Dr Denman George on 08-31-14, with CT scheduled and recommendation for surgery followed possibly by adjuvant therapy. Genetics testing was also recommended, with concern for Lynch syndrome. CT AP 09-04-14 had LLL pulmonary scaring with lung bases otherwise clear, 1.6 cm hypodense lesion inferior posterior right hepatic lobe of unclear etiology, no adenopathy, no free fluid, ovaries normal, inhomogeneity of endometrium. Surgery was at [facility] 09-15-14 by Dr Alycia Rossetti, robotic assisted total laparoscopic hysterectomy, BSO, bilateral pelvic and para-arotic lymphednectomy. Pathology [facility] [phone]) from 09-15-14 found mixed adenocarcinoma ( 70% endometroid grade 3 and 30% serous grade 2) involving endometrium, with total size 6.5 x 2.8 x 0.8 cm and 2 mm myometrial invasion where wall 2.2 cm thick (9%). There was no involvement of serosa, lower uterine segment, cervix, adnexa or ovaries. There was no LVSI. Nodes: 3 right periaortic, no identified left periaortic (due to anatomy irregular on left at time of surgery), 5 right pelvic and 3 left pelvic nodes negative. ER + 90%. Pathologic stage IA. Operative note does not describe posterior liver region. Her case was discussed at [facility] on 10-04-14, with recommendation for adjuvant carboplatin taxol, and vaginal brachytherapy. 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 Carbo taxol given x 6 cycles from12-30-15 thru 02-15-15, with neulasta support. Radiation by Dr Sondra Come: Radiation treatment dates: 01/24/2015, 01/30/2015, 02/06/2015, 02/13/2015, 02/20/2015 Site/dose: Proximal vagina, 4 cm treatment length, 6 gray to the mucosal surface total dose 30 gray Beams/energy: Iridium 192 as the high-dose-rate source, 3 cm vaginal  cylinder  [facility] SYNDROME documented by genetics testing sent  11-2014.   DCIS RIGHT BREAST post right mastectomy with sentinel node and prophylactic left mastectomy 12-2010, with bilateral reconstructions. No genetic concerns on Breast Next panel also sent 11-2014.  Objective:  Vital signs in last 24 hours:  BP 134/57 mmHg  Pulse 71  Temp(Src) 97.9 F (36.6 C) (Oral)  Resp 18  Ht 5' 3"  (1.6 m)  Wt 133 lb 9.6 oz (60.601 kg)  BMI 23.67 kg/m2  SpO2 100%  LMP 09/04/2014 Weight down 2 lbs.  Alert, oriented and appropriate. Ambulatory without difficulty. Respirations not labored RA. Hair has grown back.   HEENT:PERRL, sclerae not icteric. Oral mucosa moist without lesions, posterior pharynx clear.  Neck supple. No JVD.  Lymphatics:no cervical,supraclavicular, axillary or inguinal adenopathy Resp: clear to auscultation bilaterally and normal percussion bilaterally Cardio: regular rate and rhythm. No gallop. GI: soft, nontender, not distended, no mass or organomegaly. Normally active bowel sounds. Surgical incision not remarkable. Musculoskeletal/ Extremities: without pitting edema, cords, tenderness Neuro: no peripheral neuropathy. Otherwise nonfocal Skin without rash, ecchymosis, petechiae  Axillae benign. Portacath-without erythema or tenderness  Lab Results:  Results for orders placed or performed in visit on 09/17/15  CBC with Differential  Result Value Ref Range   WBC 6.3 3.9 - 10.3 10e3/uL   NEUT# 3.3 1.5 - 6.5 10e3/uL   HGB 11.9 11.6 - 15.9 g/dL   HCT 35.2 34.8 - 46.6 %   Platelets 240 145 - 400 10e3/uL   MCV 91.6 79.5 - 101.0 fL   MCH 31.0 25.1 - 34.0 pg   MCHC 33.8 31.5 - 36.0 g/dL   RBC 3.84 3.70 - 5.45 10e6/uL   RDW 13.2 11.2 - 14.5 %   lymph# 2.4 0.9 - 3.3 10e3/uL   MONO# 0.3 0.1 - 0.9 10e3/uL   Eosinophils Absolute 0.2 0.0 - 0.5 10e3/uL   Basophils Absolute 0.1 0.0 - 0.1 10e3/uL   NEUT% 52.7 38.4 - 76.8 %   LYMPH% 38.0 14.0 - 49.7 %   MONO% 5.3 0.0 - 14.0 %    EOS% 3.0 0.0 - 7.0 %   BASO% 1.0 0.0 - 2.0 %  Iron and TIBC [facility]  Result Value Ref Range   Iron 56 41 - 142 ug/dL   TIBC 423 236 - 444 ug/dL   UIBC 366 120 - 384 ug/dL   %SAT 13 (L) 21 - 57 %  Ferritin  Result Value Ref Range   Ferritin 28 9 - 269 ng/ml     Studies/Results:  No results found.  Medications: I have reviewed the patient's current medications. Cannot give shingles vaccine at this office due to her insurance, however insurance will cover at her pharmacy, order to be sent there.  DISCUSSION: clinically doing well from standpoint of gyn cancer and Lynch diagnosis. Continue close observation. Smoking cessation stressed.  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  Vaginal brachytherapy completed 02-20-15. Alternating visits with gyn oncology and radiation oncology.  2.Lynch syndrome, with MSH6 documented 11-2014. Continue to encourage testing of family members, which our genetics counseling department is glad to facilitate. Note she has 6 children and ~ 11 grands, 2 children in [place] and others out of state and overseas. Note family history of colon cancer in father who died age 51, brain cancer paternal uncle in his 34s. 3.colon polyps: Diagnosis of Lynch syndrome, already followed with colonoscopies by Dr Melina Copa up to date,  4.personal hx DCIS right breast 2012, treated  with right mastectomy and prophylactic left mastectomy with bilateral reconstruction, no radiation and no hormonal intervention 5..ongoing tobacco, which she now wants to stop. Tobacco cessation counseling, nicotine patch 6..Hemoglobin improved,, other counts good. Iron studies still show some iron deficiency, continue oral iron. 7..bipolar 2 disorder (hypomanic) with history of PTSD and anxiety, followed by psychiatrist at [facility], on multiple medications, which limited choice of antiemetics  with chemo, but did well with compazine and EMEND. She has decreased alcohol. 8. PAC in: will keep at least for next several months. 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 Flu vaccine done. Shingles vaccine to be done locally 11 Bilateral hip pain: evaluated by orthopedics in [place] in past for arthritis, could also be bursitis. Recommended not crossing legs. PCP managing.   All questions answered. Time spent 25 min including >50% counseling and coordination of care. PAC flushes every 6-8 weeks when not otherwise used, coordinate my visits with flushes. To see Dr Denman George in Jan, which has been requested now,  and Dr Sondra Come in April. Needs to discontinue smoking completely. She knows to call prior to needed. Cc Drs Amparo Bristol, MD   09/17/2015, 5:26 PM

## 2015-09-18 ENCOUNTER — Ambulatory Visit: Payer: Medicare Other

## 2015-09-18 ENCOUNTER — Other Ambulatory Visit: Payer: Medicare Other

## 2015-11-12 ENCOUNTER — Ambulatory Visit (HOSPITAL_BASED_OUTPATIENT_CLINIC_OR_DEPARTMENT_OTHER): Payer: Medicare Other

## 2015-11-12 ENCOUNTER — Ambulatory Visit: Payer: Medicare Other | Attending: Gynecologic Oncology | Admitting: Gynecologic Oncology

## 2015-11-12 ENCOUNTER — Other Ambulatory Visit (HOSPITAL_BASED_OUTPATIENT_CLINIC_OR_DEPARTMENT_OTHER): Payer: Medicare Other

## 2015-11-12 ENCOUNTER — Encounter: Payer: Self-pay | Admitting: Gynecologic Oncology

## 2015-11-12 ENCOUNTER — Ambulatory Visit: Payer: Medicare Other

## 2015-11-12 VITALS — BP 106/56 | HR 74 | Temp 97.9°F | Resp 20 | Ht 63.0 in | Wt 140.3 lb

## 2015-11-12 DIAGNOSIS — Z1509 Genetic susceptibility to other malignant neoplasm: Secondary | ICD-10-CM

## 2015-11-12 DIAGNOSIS — Z8542 Personal history of malignant neoplasm of other parts of uterus: Secondary | ICD-10-CM | POA: Diagnosis not present

## 2015-11-12 DIAGNOSIS — Z86000 Personal history of in-situ neoplasm of breast: Secondary | ICD-10-CM | POA: Diagnosis not present

## 2015-11-12 DIAGNOSIS — C541 Malignant neoplasm of endometrium: Secondary | ICD-10-CM

## 2015-11-12 DIAGNOSIS — Z86010 Personal history of colonic polyps: Secondary | ICD-10-CM

## 2015-11-12 DIAGNOSIS — Z9013 Acquired absence of bilateral breasts and nipples: Secondary | ICD-10-CM | POA: Diagnosis not present

## 2015-11-12 DIAGNOSIS — Z95828 Presence of other vascular implants and grafts: Secondary | ICD-10-CM

## 2015-11-12 LAB — CBC WITH DIFFERENTIAL/PLATELET
BASO%: 0.6 % (ref 0.0–2.0)
BASOS ABS: 0 10*3/uL (ref 0.0–0.1)
EOS ABS: 0.2 10*3/uL (ref 0.0–0.5)
EOS%: 3.5 % (ref 0.0–7.0)
HCT: 34.7 % — ABNORMAL LOW (ref 34.8–46.6)
HGB: 11.7 g/dL (ref 11.6–15.9)
LYMPH%: 34.6 % (ref 14.0–49.7)
MCH: 31.3 pg (ref 25.1–34.0)
MCHC: 33.7 g/dL (ref 31.5–36.0)
MCV: 92.8 fL (ref 79.5–101.0)
MONO#: 0.3 10*3/uL (ref 0.1–0.9)
MONO%: 5.2 % (ref 0.0–14.0)
NEUT#: 3.7 10*3/uL (ref 1.5–6.5)
NEUT%: 56.1 % (ref 38.4–76.8)
Platelets: 232 10*3/uL (ref 145–400)
RBC: 3.74 10*6/uL (ref 3.70–5.45)
RDW: 13 % (ref 11.2–14.5)
WBC: 6.5 10*3/uL (ref 3.9–10.3)
lymph#: 2.3 10*3/uL (ref 0.9–3.3)

## 2015-11-12 LAB — COMPREHENSIVE METABOLIC PANEL
ALK PHOS: 60 U/L (ref 40–150)
ALT: 25 U/L (ref 0–55)
AST: 34 U/L (ref 5–34)
Albumin: 3.7 g/dL (ref 3.5–5.0)
Anion Gap: 7 mEq/L (ref 3–11)
BUN: 12.2 mg/dL (ref 7.0–26.0)
CO2: 27 meq/L (ref 22–29)
Calcium: 9.2 mg/dL (ref 8.4–10.4)
Chloride: 111 mEq/L — ABNORMAL HIGH (ref 98–109)
Creatinine: 0.9 mg/dL (ref 0.6–1.1)
EGFR: 75 mL/min/{1.73_m2} — AB (ref >90)
GLUCOSE: 105 mg/dL (ref 70–140)
POTASSIUM: 4 meq/L (ref 3.5–5.1)
SODIUM: 144 meq/L (ref 136–145)
Total Bilirubin: <0.3 mg/dL (ref 0.20–1.20)
Total Protein: 6.8 g/dL (ref 6.4–8.3)

## 2015-11-12 MED ORDER — SODIUM CHLORIDE 0.9 % IJ SOLN
10.0000 mL | INTRAMUSCULAR | Status: DC | PRN
  Administered 2015-11-12: 10 mL via INTRAVENOUS
  Filled 2015-11-12: qty 10

## 2015-11-12 MED ORDER — HEPARIN SOD (PORK) LOCK FLUSH 100 UNIT/ML IV SOLN
500.0000 [IU] | Freq: Once | INTRAVENOUS | Status: AC
  Administered 2015-11-12: 500 [IU] via INTRAVENOUS
  Filled 2015-11-12: qty 5

## 2015-11-12 NOTE — Progress Notes (Signed)
Consult Note: Gyn-Onc  Laurie Benson 50 y.o. female  CC:  Chief Complaint  Patient presents with  . endometrial cancer    follow up    HPI: Laurie Benson initially presented with grade 2 vs serous endometrial cancer. The patient reports that in the past 12 months her previously regular menses have become heavy and irregular. This prompted Dr Marvel Plan to perform an ultrasound of the pelvis on 07/27/14 which revealed a uterus measuring 8x5x5cm with a 2cm intramural fibroid, and normal appearing ovaries. The endometrial thickness was 2cm. 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 She also reports a history of multiple colonic polyps and has had several resections of these. She is recommended to continue annual colonoscopies.   She underwent TRH/BSO and appropriate staging at [facility] on 09/15/14.   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%) (see comment)   Histologic grade: FIGO grade 3 (endometrioid adenocarcinoma: FIGO grade 2; serous carcinoma FIGO grade 3)  Tumor site: endometrium, anterior and posterior   Tumor size (gross): 6.5 x 2.8 x 0.8 cm   Myometrial invasion: Inner half  Depth: 2 mm Wall thickness: 2.2 cm Percent: 9% (A12)  Serosal involvement: not identified   Lower uterine segment involvement: not identified   Cervical involvement: not identified   Adnexal  involvement: not identified   Other involved sites: not applicable   Cervical/vaginal margin and distance: widely negative (>3 cm)   Lymphovascular space invasion: not identified   Regional lymph nodes (see other specimens): Total number involved: 0  Total number examined: 11  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 She was dispositioned to taxol and carboplatin x 6 cycles and vaginal brachytherapy. Her tumor was tested for microsatellite instability. The tumor was MSH-6 negative, MSH-2 positive, PMS-2 positive, and MLH-1 positive. 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   She completed chemotherapy from 12/15 through 02/15/15. She completed vaginal brachytherapy from 01/24/15 through 4-19/16.  She tolerated treatments well.  CT scan of the abdomen and pelvis on 04/16/15 revealed no evidence of persistent or recurrent or metastatic disease.   Interval History:  She has been doing well with no specific complaints. No vaginal bleeding. No new health concerns.  Review of Systems  Current Meds:  Outpatient Encounter Prescriptions as of 11/12/2015  Medication Sig  . acyclovir ointment (ZOVIRAX) 5 % Apply to fever blister 5 x daily.  . ARIPiprazole (ABILIFY) 20 MG tablet Take 20 mg by mouth daily.  Marland Kitchen aspirin-acetaminophen-caffeine (EXCEDRIN MIGRAINE) 250-250-65 MG per tablet Take 1 tablet by mouth every 6 (six) hours as needed.  . celecoxib (CELEBREX) 100 MG capsule Take 100 mg by mouth.  . Choline Fenofibrate (FENOFIBRIC ACID) 135 MG CPDR Take 135 mg by mouth daily.   . clonazePAM (KLONOPIN) 2 MG tablet Take 2 mg by mouth 3 (three) times daily.   . [employer] ASPIRIN EC 325 MG EC tablet Take 325 mg by mouth daily.  Marland Kitchen docusate sodium (COLACE) 100 MG capsule Take 1 capsule (100 mg total)  by mouth 2 (two) times daily.  . ferrous fumarate (HEMOCYTE - 106 MG FE) 325 (106 FE) MG TABS tablet Take 1 tablet (106 mg of iron total) by mouth daily.  Take 1 tablet by mouth daily on empty stomach with orange juice.  Marland Kitchen FLUoxetine (PROZAC) 40 MG capsule Take 40 mg by mouth 2 (two) times daily.   . folic acid (FOLVITE) 332 MCG tablet Take 400 mcg by mouth daily.  Marland Kitchen ibuprofen (ADVIL,MOTRIN) 600 MG tablet Take 1 tablet (600 mg total) by mouth every 6 (six) hours as needed for moderate pain.  Marland Kitchen lidocaine-prilocaine (EMLA) cream Apply 1 application topically as needed. APPLY TO PORT A CATH ONE TO TWO HOURS PRIOR TO TREATMENT.  Marland Kitchen loratadine (CLARITIN) 10 MG tablet Take 10 mg by mouth daily.  . nicotine (NICODERM CQ) 7 mg/24hr patch Place 1 patch (7 mg total) onto the skin daily.  Marland Kitchen oxyCODONE-acetaminophen (PERCOCET/ROXICET) 5-325 MG tablet Take 1 tablet by mouth every 4 (four) hours as needed for severe pain.  . pravastatin (PRAVACHOL) 40 MG tablet Take 40 mg by mouth daily.  . QUEtiapine (SEROQUEL) 100 MG tablet Take 100 mg by mouth at bedtime.  . thiamine (VITAMIN B-1) 100 MG tablet Take 100 mg by mouth daily.  . traMADol (ULTRAM) 50 MG tablet Take by mouth every 6 (six) hours as needed.  . vitamin B-12 (CYANOCOBALAMIN) 1000 MCG tablet Take 1 tablet (1,000 mcg total) by mouth daily.  Marland Kitchen zoster vaccine live, PF, (ZOSTAVAX) 95188 UNT/0.65ML injection Inject 19,400 Units into the skin once.  . [DISCONTINUED] sodium chloride 0.9 % injection 10 mL    No facility-administered encounter medications on file as of 11/12/2015.    Allergy:  Allergies  Allergen Reactions  . Tetracyclines & Related Swelling    Social Hx:   Social History   Social History  . Marital Status: Legally Separated    Spouse Name: N/A  . Number of Children: 6  . Years of Education: N/A   Occupational History  . Not on file.   Social History Main Topics  . Smoking status: Light Tobacco Smoker -- 0.25 packs/day for 14 years    Types: Cigarettes  . Smokeless tobacco: Never Used  . Alcohol Use: Yes  . Drug Use: No  . Sexual Activity: Not Currently   Other Topics  Concern  . Not on file   Social History Narrative    Past Surgical Hx:  Past Surgical History  Procedure Laterality Date  . Breast surgery  2012    bilat mastectomy-rt snbx  . Tubal ligation    . Cesarean section      x2  . Multiple tooth extractions    . Breast reconstruction  11/20/2011    Procedure: BREAST RECONSTRUCTION;  Surgeon: Theodoro Kos, DO;  Location: [place] [facility];  Service: Plastics;  Laterality: Bilateral;  bilateral implant exchange for breast reconstruction and capsulectomy  . Robotic assisted laparoscopic hysterectomy and salpingectomy  09/15/14    at [facility] by Dr. Rogelio Seen  . Pelvic and para-aortic lymph node dissection  09/15/14    Past Medical Hx:  Past Medical History  Diagnosis Date  . Anxiety   . Depression   . Complication of anesthesia     heard talking during teeth extraction  . Family history of colon cancer   . Family history of brain cancer   . Radiation 01/27/15, 01/30/15, 02/06/15, 02/13/15, 02/20/15    proximal vagina 30 gray  . Breast cancer ([place])   .  Cancer ([place]) 2012    bilat br ca  . Uterine cancer ([place])   . Lynch syndrome     MSH6 mutation    Oncology Hx:    Breast cancer ([place])   11/20/2011 Initial Diagnosis Breast cancer    Endometrial cancer ([place])   08/31/2014 Initial Diagnosis Endometrial cancer   09/15/2014 Surgery TRH/BSO and staging. IA UPSC    Chemotherapy     Family Hx:  Family History  Problem Relation Age of Onset  . Colon cancer Father 31  . Brain cancer Paternal Uncle 39  . Cancer Paternal Grandmother     cancer of her "eyes"    Vitals:  Blood pressure 106/56, pulse 74, temperature 97.9 F (36.6 C), temperature source Oral, resp. rate 20, height 5' 3"  (1.6 m), weight 140 lb 4.8 oz (63.64 kg), last menstrual period 09/04/2014, SpO2 99 %.  Physical Exam: Well-nourished well-developed female in no acute distress. She seems slightly sedated today.  Abdomen: Well-healed surgical incisions. Abdomen is soft  and nontender.  Pelvic: Normal female genitalia. The vaginal cuff is visualized and completely healed. There are no masses. There is no tenderness.   Assessment/Plan: Laurie Benson 49 y.o. 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 She also carries a diagnosis of Lynch syndrome.    She is NED on today's exam and imaging.  CA 125 today.  I am recommending continuing q 3 monthly evaluations as part of surveillance. She will next see Dr Marko Plume, then Dr Sondra Come and myself 3 months after that.  I counseled her about concerning symptoms for recurrence and that she should notify my office if these appear before his scheduled visit.  Donaciano Eva, MD 11/12/2015, 12:56 PM

## 2015-11-12 NOTE — Patient Instructions (Signed)
We will contact you with your lab results CA 125  from today's visit  , please call [phone]  in May to schedule an appointment with Dr [name] in early July 2017 .  Call with any changes , questions or concerns.  Thank you!

## 2015-12-20 ENCOUNTER — Other Ambulatory Visit: Payer: Self-pay | Admitting: Oncology

## 2015-12-24 DIAGNOSIS — R799 Abnormal finding of blood chemistry, unspecified: Secondary | ICD-10-CM | POA: Insufficient documentation

## 2015-12-24 DIAGNOSIS — R4182 Altered mental status, unspecified: Secondary | ICD-10-CM | POA: Insufficient documentation

## 2015-12-27 DIAGNOSIS — M7061 Trochanteric bursitis, right hip: Secondary | ICD-10-CM | POA: Insufficient documentation

## 2015-12-29 ENCOUNTER — Other Ambulatory Visit: Payer: Self-pay | Admitting: Oncology

## 2016-01-06 ENCOUNTER — Other Ambulatory Visit: Payer: Self-pay | Admitting: Oncology

## 2016-01-06 DIAGNOSIS — C541 Malignant neoplasm of endometrium: Secondary | ICD-10-CM

## 2016-01-06 DIAGNOSIS — Z1509 Genetic susceptibility to other malignant neoplasm: Secondary | ICD-10-CM

## 2016-01-07 ENCOUNTER — Ambulatory Visit (HOSPITAL_BASED_OUTPATIENT_CLINIC_OR_DEPARTMENT_OTHER): Payer: Medicare Other | Admitting: Oncology

## 2016-01-07 ENCOUNTER — Other Ambulatory Visit (HOSPITAL_BASED_OUTPATIENT_CLINIC_OR_DEPARTMENT_OTHER): Payer: Medicare Other

## 2016-01-07 ENCOUNTER — Telehealth: Payer: Self-pay

## 2016-01-07 ENCOUNTER — Telehealth: Payer: Self-pay | Admitting: Oncology

## 2016-01-07 ENCOUNTER — Encounter: Payer: Self-pay | Admitting: Oncology

## 2016-01-07 ENCOUNTER — Ambulatory Visit (HOSPITAL_BASED_OUTPATIENT_CLINIC_OR_DEPARTMENT_OTHER): Payer: Medicare Other

## 2016-01-07 VITALS — BP 115/55 | HR 75 | Temp 98.3°F | Resp 18 | Ht 63.0 in | Wt 137.5 lb

## 2016-01-07 DIAGNOSIS — C541 Malignant neoplasm of endometrium: Secondary | ICD-10-CM

## 2016-01-07 DIAGNOSIS — Z95828 Presence of other vascular implants and grafts: Secondary | ICD-10-CM

## 2016-01-07 DIAGNOSIS — M25551 Pain in right hip: Secondary | ICD-10-CM | POA: Diagnosis not present

## 2016-01-07 DIAGNOSIS — Z853 Personal history of malignant neoplasm of breast: Secondary | ICD-10-CM

## 2016-01-07 DIAGNOSIS — M25552 Pain in left hip: Secondary | ICD-10-CM

## 2016-01-07 DIAGNOSIS — F319 Bipolar disorder, unspecified: Secondary | ICD-10-CM

## 2016-01-07 DIAGNOSIS — Z1509 Genetic susceptibility to other malignant neoplasm: Secondary | ICD-10-CM | POA: Diagnosis not present

## 2016-01-07 DIAGNOSIS — Z720 Tobacco use: Secondary | ICD-10-CM

## 2016-01-07 LAB — COMPREHENSIVE METABOLIC PANEL
ALBUMIN: 3.4 g/dL — AB (ref 3.5–5.0)
ALK PHOS: 48 U/L (ref 40–150)
ALT: 16 U/L (ref 0–55)
AST: 20 U/L (ref 5–34)
Anion Gap: 8 mEq/L (ref 3–11)
BUN: 10.4 mg/dL (ref 7.0–26.0)
CALCIUM: 8.9 mg/dL (ref 8.4–10.4)
CO2: 26 mEq/L (ref 22–29)
Chloride: 110 mEq/L — ABNORMAL HIGH (ref 98–109)
Creatinine: 0.8 mg/dL (ref 0.6–1.1)
EGFR: 81 mL/min/{1.73_m2} — AB (ref >90)
GLUCOSE: 110 mg/dL (ref 70–140)
POTASSIUM: 4.1 meq/L (ref 3.5–5.1)
SODIUM: 144 meq/L (ref 136–145)
Total Bilirubin: <0.3 mg/dL (ref 0.20–1.20)
Total Protein: 6.3 g/dL — ABNORMAL LOW (ref 6.4–8.3)

## 2016-01-07 LAB — CBC WITH DIFFERENTIAL/PLATELET
BASO%: 0.9 % (ref 0.0–2.0)
BASOS ABS: 0.1 10*3/uL (ref 0.0–0.1)
EOS ABS: 0.2 10*3/uL (ref 0.0–0.5)
EOS%: 3.4 % (ref 0.0–7.0)
HCT: 34.1 % — ABNORMAL LOW (ref 34.8–46.6)
HEMOGLOBIN: 11.3 g/dL — AB (ref 11.6–15.9)
LYMPH%: 39.5 % (ref 14.0–49.7)
MCH: 29.6 pg (ref 25.1–34.0)
MCHC: 33.2 g/dL (ref 31.5–36.0)
MCV: 89.3 fL (ref 79.5–101.0)
MONO#: 0.4 10*3/uL (ref 0.1–0.9)
MONO%: 6.6 % (ref 0.0–14.0)
NEUT#: 3 10*3/uL (ref 1.5–6.5)
NEUT%: 49.6 % (ref 38.4–76.8)
Platelets: 236 10*3/uL (ref 145–400)
RBC: 3.81 10*6/uL (ref 3.70–5.45)
RDW: 13.6 % (ref 11.2–14.5)
WBC: 6.1 10*3/uL (ref 3.9–10.3)
lymph#: 2.4 10*3/uL (ref 0.9–3.3)

## 2016-01-07 MED ORDER — HEPARIN SOD (PORK) LOCK FLUSH 100 UNIT/ML IV SOLN
500.0000 [IU] | Freq: Once | INTRAVENOUS | Status: AC
  Administered 2016-01-07: 500 [IU] via INTRAVENOUS
  Filled 2016-01-07: qty 5

## 2016-01-07 MED ORDER — SODIUM CHLORIDE 0.9% FLUSH
10.0000 mL | INTRAVENOUS | Status: DC | PRN
  Administered 2016-01-07: 10 mL via INTRAVENOUS
  Filled 2016-01-07: qty 10

## 2016-01-07 MED ORDER — VITAMIN B-12 1000 MCG PO TABS: 1000.0000 ug | ORAL_TABLET | Freq: Every day | ORAL | Status: DC

## 2016-01-07 NOTE — Telephone Encounter (Signed)
appt made and avs printed. Appt with Dr Denman George to be scheduled. Message sent to [facility]

## 2016-01-07 NOTE — Telephone Encounter (Signed)
-----   Message from Gordy Levan, MD sent at 01/07/2016 12:57 PM EST ----- Please send in same B12 as prescription, QS 30 days and 5 RF

## 2016-01-07 NOTE — Progress Notes (Signed)
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 Last imaging was CT AP 04-16-15. She saw Dr Denman George in 11-2015. She will see Dr Sondra Come in April, then Dr Denman George 3 months later.   Patient does not have complaints that seem clearly related to oncologic history or that treatment. She has been under emotional stress since unexpected death of daughter Erline Levine on December 01, 2015, her baby just a year old. Patient had an episode of forgetting a trip that she had just taken, possibly due to severe stress. Patient also was treated for pneumonia as outpatient in 12-2015, by PCP with antibiotic and steroids.  Respiratory symptoms have nearly resolved, with just minimal NP cough remaining. She denies fever, hemoptysis, SOB. She denies abdominal or pelvic discomfort, other bleeding, swelling LE. Bowels are moving regularly, bladder unchanged. She has not eaten well since loss of daughter, and has resumed smoking back to 1 ppd. She complains of generalized stiffness at night, this present prior to chemo, improves with activity. No problems with PAC. No changes in reconstructed breasts.  Remainder of 10 point Review of Systems negative.  PAC in flushed today Lynch syndrome Bilateral mastectomies with reconstructions Flu vaccine done today Patient requests shingles vaccine,  ONCOLOGIC HISTORY ENDOMETRIAL CARCINOMA:Patient had heavy vaginal bleeding perhaps a year ago, seen by gyn in [place] and recalls  that PAP was ok. She continued to have heavy menses and spotting between periods, seen back by Dr Ellouise Newer with ultrasound 07-27-14 which showed uterus 8x5x5 cm and biopsy, with 2 cm endometrial thickness and 2 cm intramural fibroid, normal appearing ovaries. Endometrial biopsy by Dr Marvel Plan 08-17-14 revealed FIGO grade 2 endometrial adenocarcinoma, with immunostains for p53 and p16 focally + supporting possible serous papillary component. She was seen in consultation by Dr Denman George on 08-31-14, with CT scheduled and recommendation for surgery followed possibly by adjuvant therapy. Genetics testing was also recommended, with concern for Lynch syndrome. CT AP 09-04-14 had LLL pulmonary scaring with lung bases otherwise clear, 1.6 cm hypodense lesion inferior posterior right hepatic lobe of unclear etiology, no adenopathy, no free fluid, ovaries normal, inhomogeneity of endometrium. Surgery was at [facility] 09-15-14 by Dr Alycia Rossetti, robotic assisted total laparoscopic hysterectomy, BSO, bilateral pelvic and para-arotic lymphednectomy. Pathology [facility] [phone]) from 09-15-14 found mixed adenocarcinoma ( 70% endometroid grade 3 and 30% serous grade 2) involving endometrium, with total size 6.5 x 2.8 x 0.8 cm and 2 mm myometrial invasion where wall 2.2 cm thick (9%). There was no involvement of serosa, lower uterine segment, cervix, adnexa or ovaries. There was no LVSI. Nodes: 3 right periaortic, no identified left periaortic (due to anatomy irregular on left at time of surgery), 5 right pelvic and 3 left pelvic nodes negative. ER + 90%. Pathologic stage IA. Operative note does not describe posterior liver region. Her case was discussed at [place] at [facility] on 10-04-14, with recommendation for adjuvant carboplatin taxol, and vaginal brachytherapy. 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 Carbo taxol given x 6 cycles from12-30-15 thru 02-15-15, with neulasta support. Radiation by Dr Sondra Come: Radiation treatment dates: 01/24/2015, 01/30/2015, 02/06/2015, 02/13/2015, 02/20/2015 Site/dose: Proximal vagina, 4 cm treatment length, 6 gray to the mucosal surface total dose 30 gray Beams/energy: Iridium 192 as the high-dose-rate source, 3 cm vaginal cylinder  [facility] SYNDROME documented by genetics testing sent 11-2014.   DCIS RIGHT BREAST post right mastectomy with sentinel node and prophylactic left mastectomy 12-2010, with bilateral reconstructions. No genetic concerns on Breast Next panel also sent 11-2014.    Objective:  Vital signs in last 24 hours:  BP 115/55 mmHg  Pulse 75  Temp(Src) 98.3 F (36.8 C) (Oral)  Resp 18  Ht _0  (1.6 m)  Wt 137 lb 8 oz (62.37 kg)  BMI 24.36 kg/m2  SpO2 100%  LMP 09/04/2014 Weight up 4 lbs.  Alert, oriented and appropriate. Ambulatory without difficulty.  No alopecia  HEENT:PERRL, sclerae not icteric. Oral mucosa moist without lesions, posterior pharynx clear.  Neck supple. No JVD.  Lymphatics:no cervical,supraclavicular, axillary or inguinal adenopathy Resp: clear to auscultation bilaterally and normal percussion bilaterally Cardio: regular rate and rhythm. No gallop. GI: soft, nontender, not distended, no mass or organomegaly. Normally active bowel sounds. Surgical incision not remarkable. Musculoskeletal/ Extremities: without pitting edema, cords, tenderness Neuro: no peripheral neuropathy. Otherwise nonfocal Skin without rash, ecchymosis, petechiae Breasts: bilateral reconstructions without findings of concern for malignancy.  Axillae benign. Portacath-without erythema or tenderness  Lab Results:  Results for orders placed or performed in visit on 01/07/16  CBC with Differential  Result Value Ref Range   WBC 6.1 3.9 - 10.3 10e3/uL   NEUT# 3.0 1.5 - 6.5 10e3/uL   HGB 11.3 (L) 11.6 - 15.9 g/dL   HCT 34.1 (L)  34.8 - 46.6 %   Platelets 236 145 - 400 10e3/uL   MCV 89.3 79.5 - 101.0 fL   MCH 29.6 25.1 - 34.0 pg   MCHC 33.2 31.5 - 36.0 g/dL   RBC 3.81 3.70 - 5.45 10e6/uL   RDW 13.6 11.2 - 14.5 %   lymph# 2.4 0.9 - 3.3 10e3/uL   MONO# 0.4 0.1 - 0.9 10e3/uL   Eosinophils Absolute 0.2 0.0 - 0.5 10e3/uL   Basophils Absolute 0.1 0.0 - 0.1 10e3/uL   NEUT% 49.6 38.4 - 76.8 %   LYMPH% 39.5 14.0 - 49.7 %   MONO% 6.6 0.0 - 14.0 %   EOS% 3.4 0.0 - 7.0 %   BASO% 0.9 0.0 - 2.0 %  Comprehensive metabolic panel  Result Value Ref Range   Sodium 144 136 - 145 mEq/L   Potassium 4.1 3.5 - 5.1 mEq/L   Chloride 110 (H) 98 - 109 mEq/L   CO2 26 22 - 29 mEq/L   Glucose 110 70 - 140 mg/dl   BUN 10.4 7.0 - 26.0 mg/dL   Creatinine 0.8 0.6 - 1.1 mg/dL   Total Bilirubin <0.30 0.20 - 1.20 mg/dL   Alkaline Phosphatase 48 40 - 150 U/L   AST 20 5 - 34 U/L   ALT 16 0 - 55 U/L   Total Protein 6.3 (L) 6.4 - 8.3 g/dL   Albumin 3.4 (L) 3.5 - 5.0 g/dL   Calcium 8.9 8.4 - 10.4 mg/dL   Anion Gap 8 3 - 11 mEq/L   EGFR 81 (L) >90 ml/min/1.73 m2     Studies/Results:  No results found.  Medications: I have reviewed the patient's current medications.  DISCUSSION  Smoking cessation discussed and encouraged; prior to daughter's death she was down to 6 daily and planning DC.  Will flush PAC coordinating with other MD visits as possible. If continues to do well would likely be best to remove this.   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  Vaginal brachytherapy completed 02-20-15. Alternating visits with gyn oncology and radiation oncology.  2.Lynch syndrome, with MSH6 documented 11-2014. Continue to encourage testing of family members, which our genetics counseling department is glad to facilitate. Note she has 5 living children and ~ 74 grands, 1 son in [place] and others out of state and overseas. Note family history of colon  cancer in father who died age 94, brain cancer paternal uncle in his 16s. 3.colon polyps: Diagnosis of Lynch syndrome, already followed with colonoscopies by Dr Melina Copa up to date,  4.personal hx DCIS right breast 2012, treated with right mastectomy and prophylactic left mastectomy with bilateral reconstruction, no radiation and no hormonal intervention 5..ongoing tobacco, which she wants to stop. Tobacco cessation counseling 6. Unexpected death of daughter in her 6s in 11-2015 7..bipolar 2 disorder (hypomanic) with history of PTSD and anxiety, followed by psychiatrist at [facility], on multiple medications, which limited choice of antiemetics with chemo, but did well with compazine and EMEND. She has decreased alcohol. 8. PAC in: will keep at least for next several months. 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 Flu vaccine done 11 Bilateral hip pain preceded cancer treatment: evaluated by orthopedics in [place] in past and PCP involved   All questions answered and she knows to call if needed prior to next scheduled visit. Time spent 25 min including >50% counseling and coordination of care. Cc PCP [place], [place], L.Erskine Squibb, MD   01/07/2016, 12:59 PM

## 2016-01-08 ENCOUNTER — Telehealth: Payer: Self-pay

## 2016-01-08 DIAGNOSIS — R9402 Abnormal brain scan: Secondary | ICD-10-CM | POA: Insufficient documentation

## 2016-01-08 LAB — CA 125: CANCER ANTIGEN (CA) 125: 6 U/mL (ref 0.0–38.1)

## 2016-01-08 NOTE — Telephone Encounter (Signed)
LM in Laurie Benson' VM stating the results of the CA-125 as noted below by Dr. Marko Plume. She can call back to Dr. Mariana Kaufman office if she has any questions or concerns.

## 2016-01-08 NOTE — Telephone Encounter (Signed)
-----   Message from Gordy Levan, MD sent at 01/08/2016  8:26 AM EST ----- Labs seen and need follow up  Please let her know CA 125 is in good low range. (FYI daughter died in Dec 04, 2022)

## 2016-01-24 ENCOUNTER — Telehealth: Payer: Self-pay

## 2016-01-24 NOTE — Telephone Encounter (Signed)
Pt was forgetting things, she had forgotten that she had gone to the mountains and repacked the car to go to the mountains again. Her PCP had an MRI of brain done that showed brain tumor. MRI done at [facility] 2 weeks ago. She has an appt in [place] in 4/24 to f/u this MRI. Wanting to go out of town in May to [place]. Pt does not know name of MD she is being referred to. This RN will find out tomorrow when facility is open.

## 2016-01-25 NOTE — Telephone Encounter (Signed)
contacted [facility] and MRI report and their pt referral to neurologist received and placed on Dr Mariana Kaufman desk.

## 2016-01-28 ENCOUNTER — Encounter: Payer: Self-pay | Admitting: Oncology

## 2016-01-28 NOTE — Progress Notes (Unsigned)
MEDICAL ONCOLOGY  MRI brain report requested and received from [facility],  dated 01-03-16 and reportedly compared with CT head from 12-24-15:   No evidence of metastatic disease to brain or skull Small area T2 altered signal anterior medial right frontal lobe "may reflect mild encephalomalacia from prior insult such as trauma or infarct, tumor felt unlikely"  Report to be scanned into this EMR.  Godfrey Pick, MD

## 2016-01-29 ENCOUNTER — Telehealth: Payer: Self-pay

## 2016-01-29 DIAGNOSIS — C541 Malignant neoplasm of endometrium: Secondary | ICD-10-CM

## 2016-01-29 MED ORDER — DOCUSATE SODIUM 100 MG PO CAPS: 100.0000 mg | ORAL_CAPSULE | Freq: Two times a day (BID) | ORAL | Status: DC

## 2016-01-29 NOTE — Telephone Encounter (Signed)
S/w pt rx for colace e-scribed to [facility]

## 2016-01-31 ENCOUNTER — Encounter: Payer: Self-pay | Admitting: Oncology

## 2016-01-31 IMAGING — CT CT ABD-PELV W/ CM
2 of 5 series · 17 of 46 positions shown, 19 images · IV contrast (OMNIPAQUE)
Comparison: 09/04/2014.

CLINICAL DATA: Subsequent encounter for endometrial cancer

EXAM:
CT ABDOMEN AND PELVIS WITH CONTRAST
TECHNIQUE: Multidetector CT imaging of the abdomen and pelvis was performed
using the standard protocol following bolus administration of
intravenous contrast.
CONTRAST:  100mL OMNIPAQUE IOHEXOL 300 MG/ML  SOLN

[Series 2: rtn a/p with · axial · 0.74mm/px · z∈[-568,-214]mm · 14 of 81 slices shown, 16 images]
[im 5/81  soft-tissue]
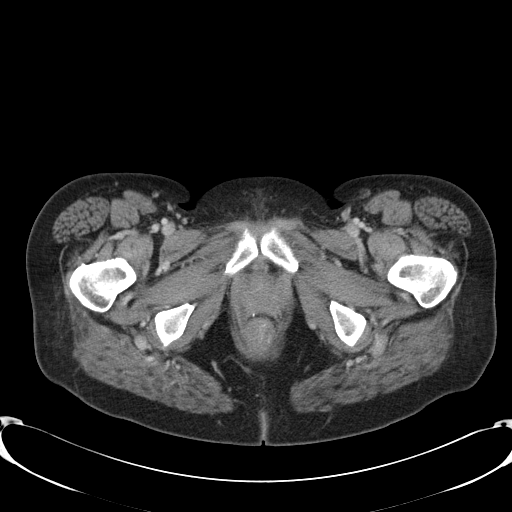
[im 5/81  bone]
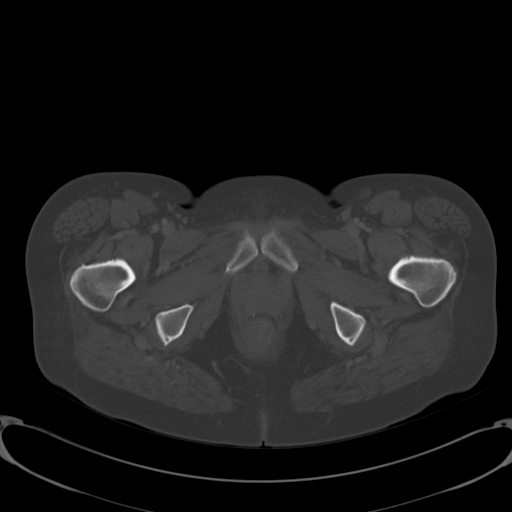
[im 10/81  soft-tissue]
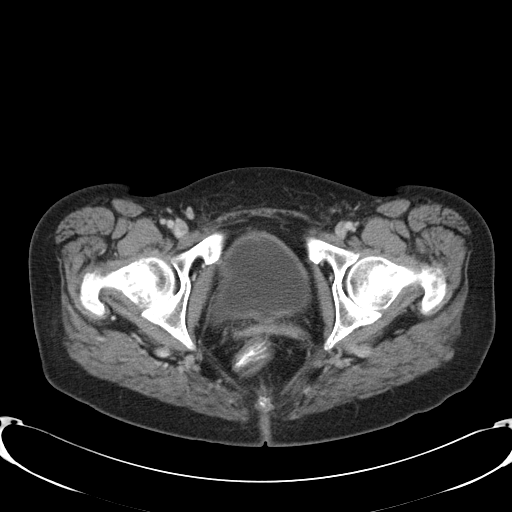
[im 15/81  soft-tissue]
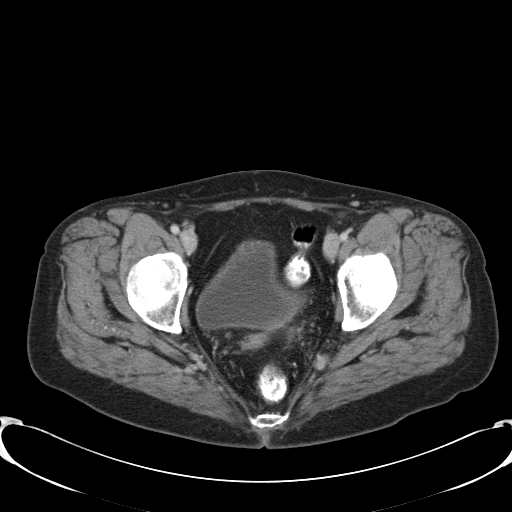
[im 24/81  soft-tissue]
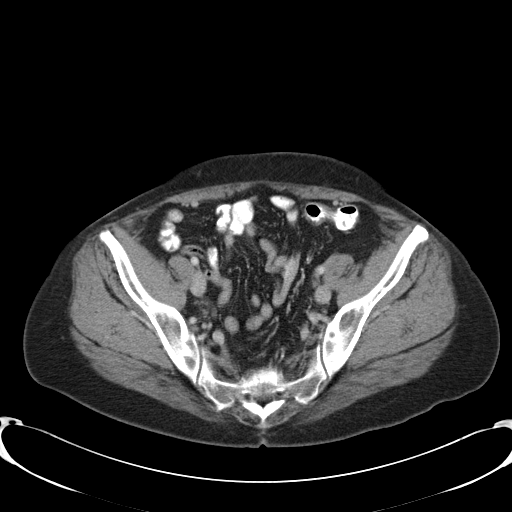
[im 29/81  soft-tissue]
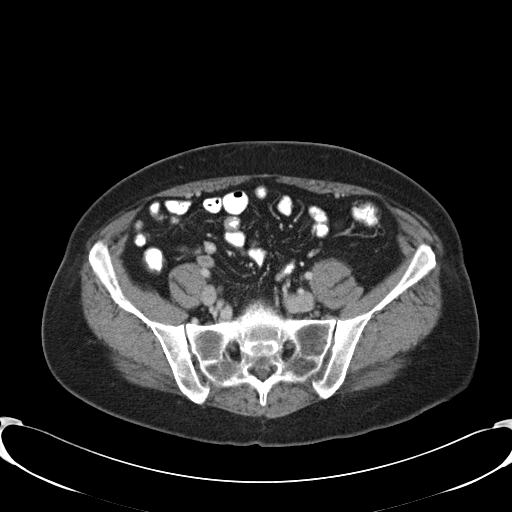
[im 33/81  soft-tissue]
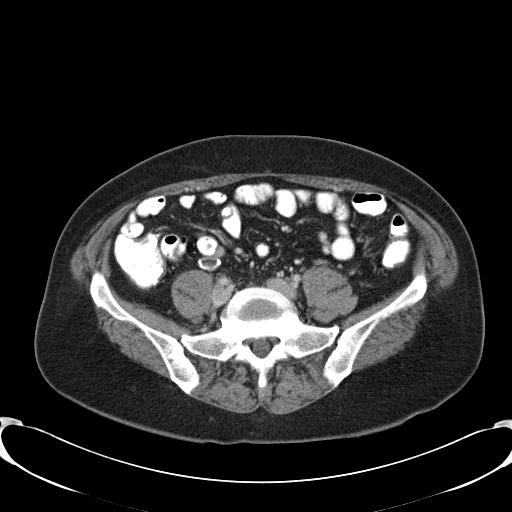
[im 38/81  soft-tissue]
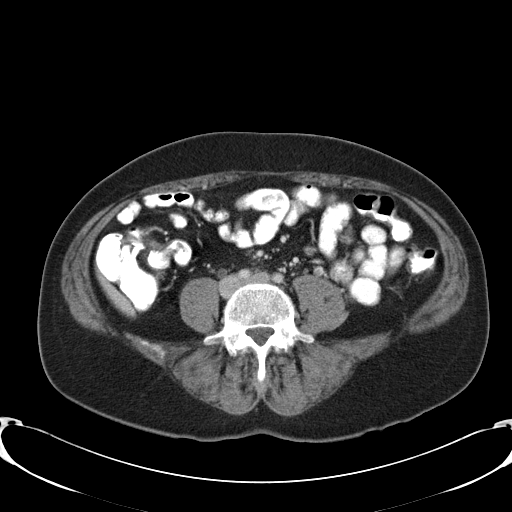
[im 43/81  soft-tissue]
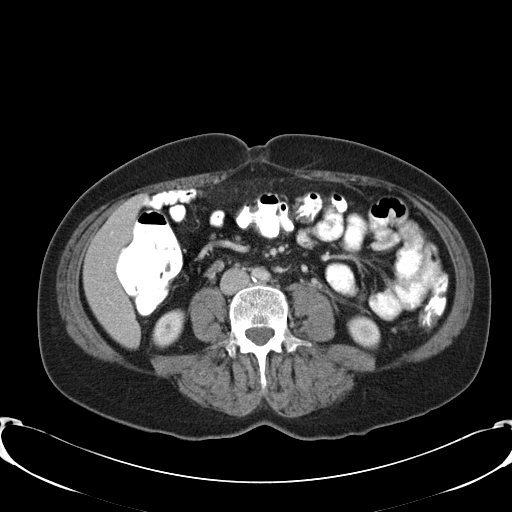
[im 48/81  soft-tissue]
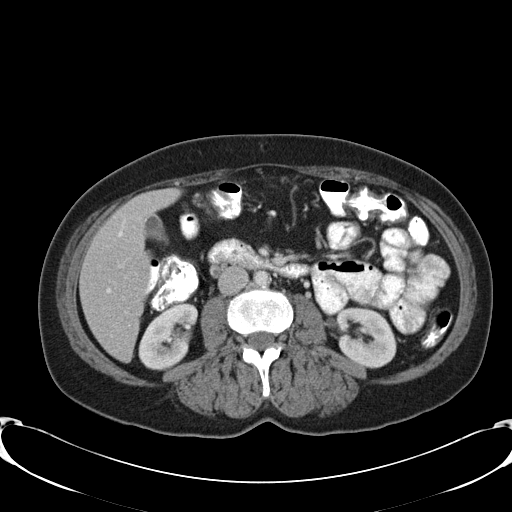
[im 48/81  bone]
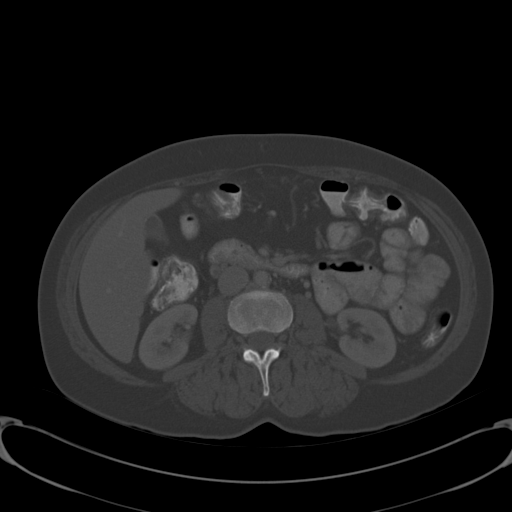
[im 52/81  soft-tissue]
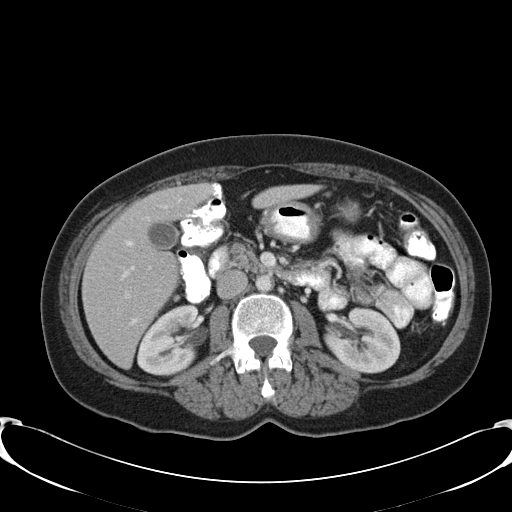
[im 62/81  soft-tissue]
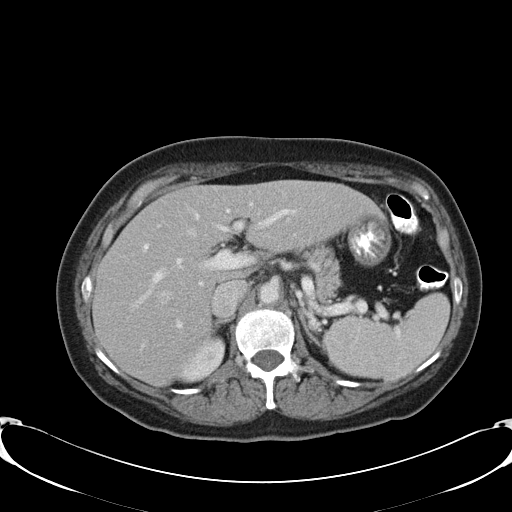
[im 66/81  soft-tissue]
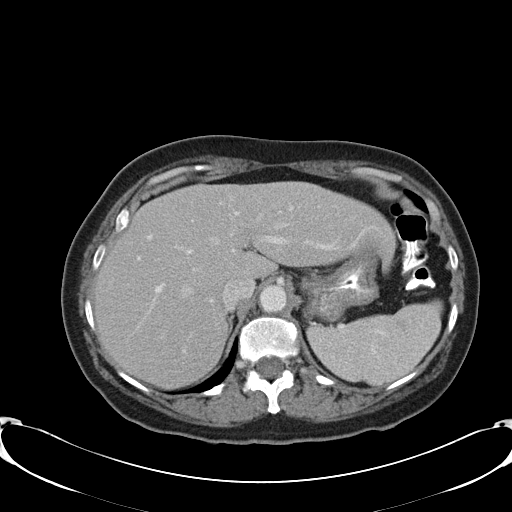
[im 71/81  soft-tissue]
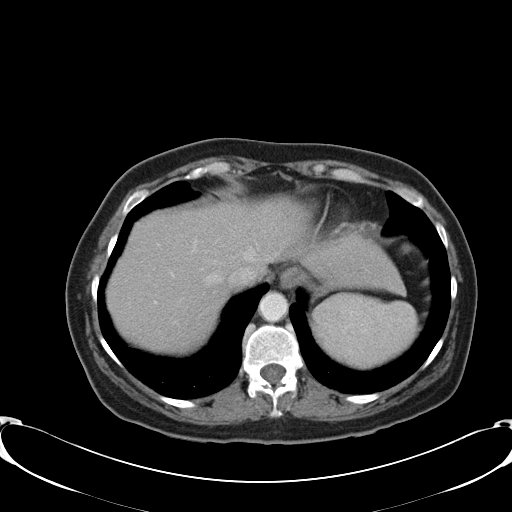
[im 76/81  soft-tissue]
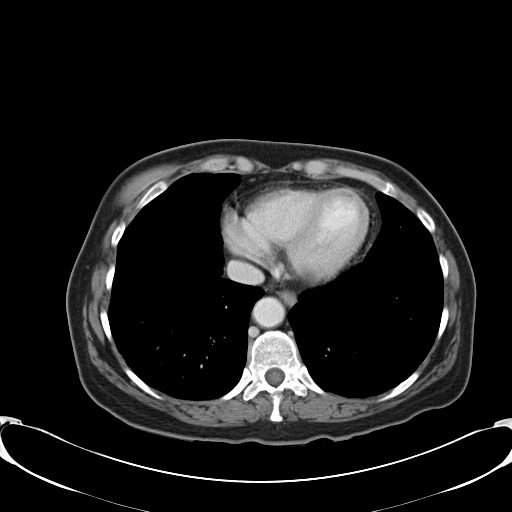

[Series 602: <mpr thick range> · coronal · 0.78mm/px · 3 of 76 slices shown]
[im 26/76  soft-tissue]
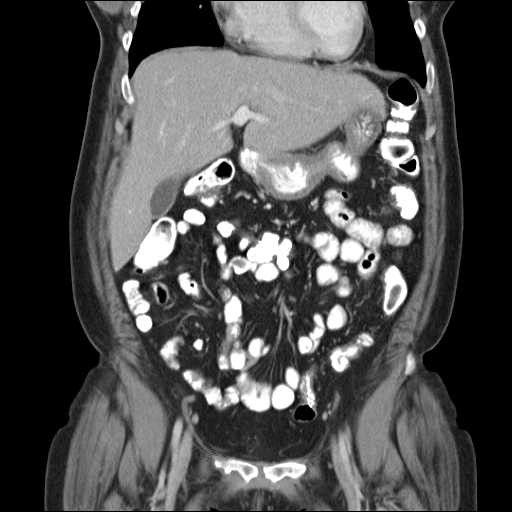
[im 34/76  soft-tissue]
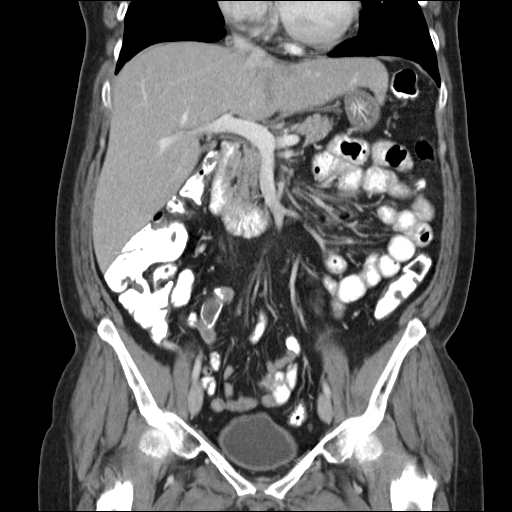
[im 42/76  soft-tissue]
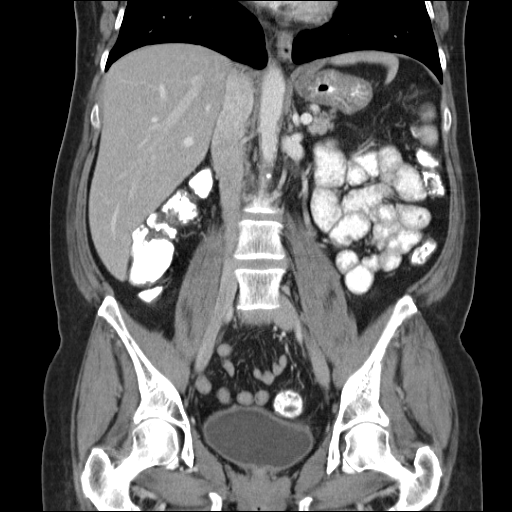

[17 of 46 positions shown; findings below may reference images not displayed]

FINDINGS: Lower chest:  Unremarkable.

Hepatobiliary: No focal abnormality within the liver parenchyma.
There is no evidence for gallstones, gallbladder w All thickening,
or pericholecystic fluid. No intrahepatic or extrahepatic biliary
dilation.

Pancreas: No focal mass lesion. No dilatation of the main duct. No
intraparenchymal cyst. No peripancreatic edema.

Spleen: No splenomegaly. No focal mass lesion.

Adrenals/Urinary Tract: No adrenal nodule or mass. Kidneys are
normal bilaterally. No hydroureter. Urinary bladder is unremarkable.

Stomach/Bowel: Stomach is nondistended. No gastric wall thickening.
No evidence of outlet obstruction. Duodenum is normally positioned
as is the ligament of Treitz. Terminal ileum is normal. Appendix is
normal. No gross colonic mass. No colonic wall thickening. No
substantial diverticular change.

Vascular/Lymphatic: There is abdominal aortic atherosclerosis
without aneurysm. No abdominal lymphadenopathy. No pelvic sidewall
lymphadenopathy.

Reproductive: Uterus is surgically absent.  No adnexal mass.

Other: No intraperitoneal free fluid.

Musculoskeletal: Bone windows reveal no worrisome lytic or sclerotic
osseous lesions.
IMPRESSION: Unremarkable CT evaluation of the abdomen and pelvis. Specifically,
no findings to raise concern for metastatic disease at this time.

1.

## 2016-02-14 ENCOUNTER — Ambulatory Visit (HOSPITAL_BASED_OUTPATIENT_CLINIC_OR_DEPARTMENT_OTHER): Payer: Medicare Other

## 2016-02-14 ENCOUNTER — Other Ambulatory Visit: Payer: Self-pay

## 2016-02-14 ENCOUNTER — Encounter: Payer: Self-pay | Admitting: Radiation Oncology

## 2016-02-14 ENCOUNTER — Ambulatory Visit
Admission: RE | Admit: 2016-02-14 | Discharge: 2016-02-14 | Disposition: A | Discharge status: Home / Self Care | Payer: Medicare Other | Source: Ambulatory Visit | Attending: Radiation Oncology | Admitting: Radiation Oncology

## 2016-02-14 VITALS — BP 129/70 | HR 89 | Temp 98.1°F

## 2016-02-14 VITALS — BP 125/73 | HR 73 | Temp 98.2°F | Resp 16 | Ht 63.0 in | Wt 135.6 lb

## 2016-02-14 DIAGNOSIS — Z452 Encounter for adjustment and management of vascular access device: Secondary | ICD-10-CM | POA: Diagnosis present

## 2016-02-14 DIAGNOSIS — Z95828 Presence of other vascular implants and grafts: Secondary | ICD-10-CM

## 2016-02-14 DIAGNOSIS — C541 Malignant neoplasm of endometrium: Secondary | ICD-10-CM

## 2016-02-14 DIAGNOSIS — Z1509 Genetic susceptibility to other malignant neoplasm: Secondary | ICD-10-CM

## 2016-02-14 MED ORDER — SODIUM CHLORIDE 0.9% FLUSH
10.0000 mL | INTRAVENOUS | Status: DC | PRN
  Administered 2016-02-14: 10 mL via INTRAVENOUS
  Filled 2016-02-14: qty 10

## 2016-02-14 MED ORDER — HEPARIN SOD (PORK) LOCK FLUSH 100 UNIT/ML IV SOLN
500.0000 [IU] | Freq: Once | INTRAVENOUS | Status: AC
  Administered 2016-02-14: 500 [IU] via INTRAVENOUS
  Filled 2016-02-14: qty 5

## 2016-02-14 MED ORDER — LIDOCAINE-PRILOCAINE 2.5-2.5 % EX CREA
1.0000 | TOPICAL_CREAM | CUTANEOUS | Status: DC | PRN
Start: 2016-02-14 — End: 2016-07-10

## 2016-02-14 NOTE — Progress Notes (Signed)
Laurie Benson here for follow up.  She reports she has a brain tumor that was seen on an MRI of her brain about a month ago.  She reports that she had confusion after her daughter passed away.  She denies having any bladder/bowel issues.  She denies having any vaginal/rectal bleeding.  She is not using a vaginal dilator.  She reports having a poor energy level.    BP 125/73 mmHg  Pulse 73  Temp(Src) 98.2 F (36.8 C) (Oral)  Resp 16  Ht 5\' 3"  (1.6 m)  Wt 135 lb 9.6 oz (61.508 kg)  BMI 24.03 kg/m2  SpO2 100%  LMP 09/04/2014   Wt Readings from Last 3 Encounters:  02/14/16 135 lb 9.6 oz (61.508 kg)  01/07/16 137 lb 8 oz (62.37 kg)  11/12/15 140 lb 4.8 oz (63.64 kg)

## 2016-02-14 NOTE — Patient Instructions (Signed)

## 2016-02-14 NOTE — Progress Notes (Signed)
Radiation Oncology         [phone] ________________________________  Name: Laurie Benson MRN: [id number]  Date: 02/14/2016  DOB: 1966/02/14  Follow-Up Visit Note  CC: Imagene Riches, NP  Gordy Levan, MD    ICD-9-CM ICD-10-CM   1. Endometrial cancer (HCC) 182.0 C54.1   2. Lynch syndrome V84.09 Z15.09     Diagnosis:   FIGO Stage IA Mixed subtype endometroid (70%) and 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%)   Interval Since Last Radiation: 1 year.  01/24/2015, 01/30/2015, 02/06/2015, 02/13/2015, 02/20/2015: Proximal vagina, 4 cm treatment length, 6 gray to the mucosal surface total dose 30 Gy  Narrative:  The patient returns today for routine follow-up. She reports confusion and memory loss after her daughter passed away. She denies any bladder/bowel issues or any vaginal/rectal bleeding. She is not using a vaginal dilator. She reports having a poor energy level. CT scan of the head on 12/24/15 revealed no evidence of acute intracranial abnormalities or metastatic disease. MRI of the brain on 01/03/16 revealed a small area of T2 altered signal intensity in the anterior medial aspect of the right frontal lobe adjacent to the anterior cerebral artery without enhancement. This might reflect mild encephalomalacia and a tumor is felt to be unlikely because there is no abnormal enhancement.  ALLERGIES:  is allergic to tetracyclines & related.  Meds: Current Outpatient Prescriptions  Medication Sig Dispense Refill  . acyclovir ointment (ZOVIRAX) 5 % Apply to fever blister 5 x daily. 30 g 1  . ARIPiprazole (ABILIFY) 20 MG tablet Take 20 mg by mouth daily.    Marland Kitchen aspirin-acetaminophen-caffeine (EXCEDRIN MIGRAINE) 250-250-65 MG per tablet Take 1 tablet by mouth every 6 (six) hours as needed.    . clonazePAM (KLONOPIN) 2 MG tablet Take 2 mg by mouth 3 (three) times daily.     . [facility] ASPIRIN EC 325 MG EC tablet Take 325 mg by mouth daily.  0  . docusate sodium (COLACE) 100 MG capsule Take 1 capsule (100 mg total)  by mouth 2 (two) times daily. 60 capsule 2  . ferrous fumarate (HEMOCYTE - 106 MG FE) 325 (106 FE) MG TABS tablet Take 1 tablet (106 mg of iron total) by mouth daily. Take 1 tablet by mouth daily on empty stomach with orange juice. 30 each 4  . FLUoxetine (PROZAC) 40 MG capsule Take 40 mg by mouth 2 (two) times daily.     . folic acid (FOLVITE) A999333 MCG tablet Take 400 mcg by mouth daily.    Marland Kitchen loratadine (CLARITIN) 10 MG tablet Take 10 mg by mouth daily.    . pravastatin (PRAVACHOL) 40 MG tablet Take 40 mg by mouth daily.  0  . QUEtiapine (SEROQUEL) 100 MG tablet Take 100 mg by mouth at bedtime.    . thiamine (VITAMIN B-1) 100 MG tablet Take 100 mg by mouth daily.    . vitamin B-12 (CYANOCOBALAMIN) 1000 MCG tablet Take 1 tablet (1,000 mcg total) by mouth daily. 30 tablet 5  . celecoxib (CELEBREX) 100 MG capsule TAKE ONE CAPSULE BY MOUTH TWICE A DAY AS NEEDED  1  . Choline Fenofibrate (FENOFIBRIC ACID) 135 MG CPDR Take 135 mg by mouth daily.     Marland Kitchen ibuprofen (ADVIL,MOTRIN) 600 MG tablet Take 1 tablet (600 mg total) by mouth every 6 (six) hours as needed for moderate pain. (Patient not taking: Reported on 02/14/2016) 30 tablet 2  . lidocaine-prilocaine (EMLA) cream Apply 1 application topically as needed. APPLY TO PORT A CATH ONE TO  TWO HOURS PRIOR TO TREATMENT. 30 g 1  . nicotine (NICODERM CQ) 7 mg/24hr patch Place 1 patch (7 mg total) onto the skin daily. (Patient not taking: Reported on 02/14/2016) 28 patch 4  . zoster vaccine live, PF, (ZOSTAVAX) 03474 UNT/0.65ML injection Inject 19,400 Units into the skin once. (Patient not taking: Reported on 02/14/2016) 1 each 0   No current facility-administered medications for this encounter.    Physical Findings: The patient is in no acute distress. Patient is alert and oriented.  height is 5\' 3"  (1.6 m) and weight is 135 lb 9.6 oz (61.508 kg). Her oral temperature is 98.2 F (36.8 C). Her blood pressure is 125/73 and her pulse is 73. Her respiration is 16  and oxygen saturation is 100%.  Lungs are clear to auscultation bilaterally. Heart has regular rate and rhythm. . No palpable cervical, supraclavicular, or axillary adenopathy. Abdomen is soft, non-tender, and non-distended. No hepatosplenomegaly. Port sites well healed without nodularity. No inguinal adenopathy. External genitalia non-remarkable. Pelvic Exam: Mild radiation changes in the proximal vagina. No mucosal lesions. No pelvic masses on bimanual and rectovaginal exam. Pap smear was NOT obtained.  Lab Findings: Lab Results  Component Value Date   WBC 6.1 01/07/2016   HGB 11.3* 01/07/2016   HCT 34.1* 01/07/2016   MCV 89.3 01/07/2016   PLT 236 01/07/2016    Radiographic Findings: No results found.  Impression:  No evidence of recurrence on clinical exam today.  Plan: She will follow up in 6 months. In the interval, she will be seen by Dr. Denman George in 3 months. The patient reports she is up-to-date on her colonoscopies and reports that she receives these at least every 2 years. She reports no further rectal bleeding.  She may be due for urine cytology with her next follow-up visit with Dr. Denman George or myself.  ____________________________________  Blair Promise, PhD, MD  This document serves as a record of services personally performed by Gery Pray, MD. It was created on his behalf by Darcus Austin, a trained medical scribe. The creation of this record is based on the scribe's personal observations and the provider's statements to them. This document has been checked and approved by the attending provider.

## 2016-03-05 ENCOUNTER — Encounter: Payer: Self-pay | Admitting: Oncology

## 2016-04-03 DIAGNOSIS — S2249XA Multiple fractures of ribs, unspecified side, initial encounter for closed fracture: Secondary | ICD-10-CM | POA: Insufficient documentation

## 2016-04-08 ENCOUNTER — Ambulatory Visit (HOSPITAL_BASED_OUTPATIENT_CLINIC_OR_DEPARTMENT_OTHER): Payer: Medicare Other

## 2016-04-08 VITALS — BP 85/51 | HR 82 | Temp 98.6°F | Resp 20

## 2016-04-08 DIAGNOSIS — C541 Malignant neoplasm of endometrium: Secondary | ICD-10-CM

## 2016-04-08 DIAGNOSIS — Z95828 Presence of other vascular implants and grafts: Secondary | ICD-10-CM

## 2016-04-08 DIAGNOSIS — Z452 Encounter for adjustment and management of vascular access device: Secondary | ICD-10-CM

## 2016-04-08 MED ORDER — SODIUM CHLORIDE 0.9% FLUSH
10.0000 mL | INTRAVENOUS | Status: DC | PRN
  Administered 2016-04-08: 10 mL via INTRAVENOUS
  Filled 2016-04-08: qty 10

## 2016-04-08 MED ORDER — HEPARIN SOD (PORK) LOCK FLUSH 100 UNIT/ML IV SOLN
500.0000 [IU] | Freq: Once | INTRAVENOUS | Status: AC
  Administered 2016-04-08: 500 [IU] via INTRAVENOUS
  Filled 2016-04-08: qty 5

## 2016-05-03 HISTORY — PX: LEG SURGERY: SHX1003

## 2016-05-13 DIAGNOSIS — R930 Abnormal findings on diagnostic imaging of skull and head, not elsewhere classified: Secondary | ICD-10-CM

## 2016-05-13 HISTORY — DX: Abnormal findings on diagnostic imaging of skull and head, not elsewhere classified: R93.0

## 2016-07-07 ENCOUNTER — Other Ambulatory Visit: Payer: Self-pay | Admitting: Oncology

## 2016-07-07 DIAGNOSIS — Z1509 Genetic susceptibility to other malignant neoplasm: Secondary | ICD-10-CM

## 2016-07-07 DIAGNOSIS — C541 Malignant neoplasm of endometrium: Secondary | ICD-10-CM

## 2016-07-10 ENCOUNTER — Ambulatory Visit (HOSPITAL_BASED_OUTPATIENT_CLINIC_OR_DEPARTMENT_OTHER): Payer: Medicare Other | Admitting: Oncology

## 2016-07-10 ENCOUNTER — Other Ambulatory Visit (HOSPITAL_COMMUNITY)
Admission: RE | Admit: 2016-07-10 | Discharge: 2016-07-10 | Disposition: A | Discharge status: Home / Self Care | Payer: Medicare Other | Source: Ambulatory Visit | Attending: Oncology | Admitting: Oncology

## 2016-07-10 ENCOUNTER — Telehealth: Payer: Self-pay | Admitting: Oncology

## 2016-07-10 ENCOUNTER — Encounter: Payer: Self-pay | Admitting: Oncology

## 2016-07-10 ENCOUNTER — Ambulatory Visit: Payer: Medicare Other

## 2016-07-10 ENCOUNTER — Ambulatory Visit (HOSPITAL_BASED_OUTPATIENT_CLINIC_OR_DEPARTMENT_OTHER): Payer: Medicare Other

## 2016-07-10 ENCOUNTER — Other Ambulatory Visit (HOSPITAL_BASED_OUTPATIENT_CLINIC_OR_DEPARTMENT_OTHER): Payer: Medicare Other

## 2016-07-10 VITALS — BP 118/73 | HR 82 | Temp 98.4°F | Resp 18 | Ht 63.0 in | Wt 140.0 lb

## 2016-07-10 DIAGNOSIS — Z95828 Presence of other vascular implants and grafts: Secondary | ICD-10-CM

## 2016-07-10 DIAGNOSIS — Z1509 Genetic susceptibility to other malignant neoplasm: Secondary | ICD-10-CM

## 2016-07-10 DIAGNOSIS — F319 Bipolar disorder, unspecified: Secondary | ICD-10-CM | POA: Diagnosis not present

## 2016-07-10 DIAGNOSIS — D225 Melanocytic nevi of trunk: Secondary | ICD-10-CM

## 2016-07-10 DIAGNOSIS — C541 Malignant neoplasm of endometrium: Secondary | ICD-10-CM

## 2016-07-10 DIAGNOSIS — F17209 Nicotine dependence, unspecified, with unspecified nicotine-induced disorders: Secondary | ICD-10-CM

## 2016-07-10 DIAGNOSIS — Z853 Personal history of malignant neoplasm of breast: Secondary | ICD-10-CM

## 2016-07-10 DIAGNOSIS — Z720 Tobacco use: Secondary | ICD-10-CM

## 2016-07-10 LAB — COMPREHENSIVE METABOLIC PANEL
ALBUMIN: 3.4 g/dL — AB (ref 3.5–5.0)
ALK PHOS: 76 U/L (ref 40–150)
ALT: 14 U/L (ref 0–55)
AST: 29 U/L (ref 5–34)
Anion Gap: 8 mEq/L (ref 3–11)
BUN: 12.3 mg/dL (ref 7.0–26.0)
CALCIUM: 9.1 mg/dL (ref 8.4–10.4)
CO2: 25 mEq/L (ref 22–29)
CREATININE: 0.8 mg/dL (ref 0.6–1.1)
Chloride: 112 mEq/L — ABNORMAL HIGH (ref 98–109)
EGFR: 82 mL/min/{1.73_m2} — ABNORMAL LOW (ref >90)
Glucose: 82 mg/dl (ref 70–140)
POTASSIUM: 4.3 meq/L (ref 3.5–5.1)
Sodium: 146 mEq/L — ABNORMAL HIGH (ref 136–145)
TOTAL PROTEIN: 6.6 g/dL (ref 6.4–8.3)

## 2016-07-10 LAB — CBC WITH DIFFERENTIAL/PLATELET
BASO%: 0.3 % (ref 0.0–2.0)
BASOS ABS: 0 10*3/uL (ref 0.0–0.1)
EOS%: 3.8 % (ref 0.0–7.0)
Eosinophils Absolute: 0.2 10*3/uL (ref 0.0–0.5)
HEMATOCRIT: 34.5 % — AB (ref 34.8–46.6)
HEMOGLOBIN: 11.1 g/dL — AB (ref 11.6–15.9)
LYMPH#: 1.7 10*3/uL (ref 0.9–3.3)
LYMPH%: 27 % (ref 14.0–49.7)
MCH: 29 pg (ref 25.1–34.0)
MCHC: 32.2 g/dL (ref 31.5–36.0)
MCV: 90.1 fL (ref 79.5–101.0)
MONO#: 0.4 10*3/uL (ref 0.1–0.9)
MONO%: 6 % (ref 0.0–14.0)
NEUT#: 4 10*3/uL (ref 1.5–6.5)
NEUT%: 62.9 % (ref 38.4–76.8)
Platelets: 208 10*3/uL (ref 145–400)
RBC: 3.83 10*6/uL (ref 3.70–5.45)
RDW: 15.6 % — ABNORMAL HIGH (ref 11.2–14.5)
WBC: 6.3 10*3/uL (ref 3.9–10.3)
nRBC: 0 % (ref 0–0)

## 2016-07-10 MED ORDER — SODIUM CHLORIDE 0.9 % IJ SOLN
10.0000 mL | INTRAMUSCULAR | Status: DC | PRN
  Administered 2016-07-10: 10 mL via INTRAVENOUS
  Filled 2016-07-10: qty 10

## 2016-07-10 MED ORDER — NICOTINE 14 MG/24HR TD PT24
14.0000 mg | MEDICATED_PATCH | Freq: Every day | TRANSDERMAL | 3 refills | Status: DC
Start: 2016-07-10 — End: 2022-05-30

## 2016-07-10 MED ORDER — HEPARIN SOD (PORK) LOCK FLUSH 100 UNIT/ML IV SOLN
500.0000 [IU] | Freq: Once | INTRAVENOUS | Status: AC | PRN
  Administered 2016-07-10: 500 [IU] via INTRAVENOUS
  Filled 2016-07-10: qty 5

## 2016-07-10 NOTE — Telephone Encounter (Signed)
appt made and avs printed °
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
## 2016-07-14 ENCOUNTER — Telehealth: Payer: Self-pay

## 2016-07-14 ENCOUNTER — Telehealth: Payer: Self-pay | Admitting: Oncology

## 2016-07-14 NOTE — Telephone Encounter (Signed)
Faxed records to [facility] [phone]

## 2016-07-14 NOTE — Telephone Encounter (Signed)
Laurie Benson, Laurie Benson Female, 50 y.o., 1966-10-26 Weight:  140 lb (63.5 kg) Phone:  [phone] PCP:  Imagene Riches, NP [place] MRN:  [id number] MyChart:  Code Exp Next Appt:  08/27/2016 Message  Received: 4 days ago  Message Contents  Gordy Levan, MD  Baruch Merl, RN; Janace Hoard, RN        1. I have started referral to [facility]at[facility] Phone [phone] and fax [phone].   Please fax demographics + contact info, insurance, meds, my note from 9-7 and most recent cbc cmet to # above attn Marita [place]   That office will contact patient with appointment   2. Send script if possible for nicotine patches to pharmacy 14 mg one daily #30 3 RF , or tell pharmacist what she needs   thanks

## 2016-07-14 NOTE — Telephone Encounter (Signed)
Faxed office notes as requested for referral to [facility]ermatology as requested below by Dr. Marko Plume. Sent in prescription for Nicotine patches on 07-11-16 as prescribed below by Dr. Marko Plume.

## 2016-07-15 ENCOUNTER — Telehealth: Payer: Self-pay

## 2016-07-15 NOTE — Telephone Encounter (Signed)
Laurie Benson stated that she has an appointment this Friday 07-18-16 at 41 at [facility] Dermat[facility].

## 2016-07-15 NOTE — Telephone Encounter (Signed)
-----   Message from Gordy Levan, MD sent at 07/12/2016  5:19 PM EDT ----- Please let her know that the urine specimen did not show any problems, this done as screen for kidney cancer due to the Lynch syndrome.  Please ask her if she would be ok with referring her to my partners in medical oncology at [facility], to help manage the portacath closer to home, and also to follow the Lynch syndrome and her history of DCIS breast.  She would still need to see Drs Sondra Come and Denman George for the uterine cancer follow up, but I think it would be good to establish with the medical oncologists in [place] for the other. If ok with her, please make referral   thanks

## 2016-07-15 NOTE — Telephone Encounter (Signed)
Told Ms Feeman the results of the urine test as noted below by  Dr. Marko Plume. Discussed rationale for referral to [facility] as noted below by Dr. Marko Plume. Ms Hawkey is in agreement with  Referral.

## 2016-07-18 ENCOUNTER — Telehealth: Payer: Self-pay

## 2016-07-18 NOTE — Telephone Encounter (Addendum)
Faxed records to [place] Patient coordinator at [facility] cancer center as ms Kenton Kingfisher in agreement with transfer of Medical oncology to that office as noted in Dr. Mariana Kaufman visit 07-14-16 below by Dr. Marko Plume.

## 2016-08-07 DIAGNOSIS — Z171 Estrogen receptor negative status [ER-]: Secondary | ICD-10-CM | POA: Diagnosis not present

## 2016-08-07 DIAGNOSIS — Z86000 Personal history of in-situ neoplasm of breast: Secondary | ICD-10-CM | POA: Diagnosis not present

## 2016-08-07 DIAGNOSIS — Z8542 Personal history of malignant neoplasm of other parts of uterus: Secondary | ICD-10-CM | POA: Diagnosis not present

## 2016-08-27 ENCOUNTER — Encounter: Payer: Self-pay | Admitting: Gynecologic Oncology

## 2016-08-27 ENCOUNTER — Ambulatory Visit: Payer: Medicare Other | Attending: Gynecologic Oncology | Admitting: Gynecologic Oncology

## 2016-08-27 ENCOUNTER — Ambulatory Visit (HOSPITAL_BASED_OUTPATIENT_CLINIC_OR_DEPARTMENT_OTHER): Payer: Medicare Other

## 2016-08-27 ENCOUNTER — Other Ambulatory Visit: Payer: Medicare Other

## 2016-08-27 VITALS — BP 113/64 | HR 69 | Temp 98.8°F | Resp 18 | Wt 135.4 lb

## 2016-08-27 DIAGNOSIS — Z79899 Other long term (current) drug therapy: Secondary | ICD-10-CM | POA: Diagnosis not present

## 2016-08-27 DIAGNOSIS — F329 Major depressive disorder, single episode, unspecified: Secondary | ICD-10-CM | POA: Diagnosis not present

## 2016-08-27 DIAGNOSIS — Z808 Family history of malignant neoplasm of other organs or systems: Secondary | ICD-10-CM | POA: Insufficient documentation

## 2016-08-27 DIAGNOSIS — Z90710 Acquired absence of both cervix and uterus: Secondary | ICD-10-CM | POA: Diagnosis not present

## 2016-08-27 DIAGNOSIS — Z1509 Genetic susceptibility to other malignant neoplasm: Secondary | ICD-10-CM | POA: Diagnosis not present

## 2016-08-27 DIAGNOSIS — Z9013 Acquired absence of bilateral breasts and nipples: Secondary | ICD-10-CM | POA: Insufficient documentation

## 2016-08-27 DIAGNOSIS — Z888 Allergy status to other drugs, medicaments and biological substances status: Secondary | ICD-10-CM | POA: Insufficient documentation

## 2016-08-27 DIAGNOSIS — Z853 Personal history of malignant neoplasm of breast: Secondary | ICD-10-CM | POA: Diagnosis not present

## 2016-08-27 DIAGNOSIS — Z9889 Other specified postprocedural states: Secondary | ICD-10-CM | POA: Diagnosis not present

## 2016-08-27 DIAGNOSIS — F419 Anxiety disorder, unspecified: Secondary | ICD-10-CM | POA: Diagnosis not present

## 2016-08-27 DIAGNOSIS — C541 Malignant neoplasm of endometrium: Secondary | ICD-10-CM

## 2016-08-27 DIAGNOSIS — D701 Agranulocytosis secondary to cancer chemotherapy: Secondary | ICD-10-CM

## 2016-08-27 DIAGNOSIS — Z8542 Personal history of malignant neoplasm of other parts of uterus: Secondary | ICD-10-CM

## 2016-08-27 DIAGNOSIS — Z800 Family history of malignant neoplasm of digestive organs: Secondary | ICD-10-CM | POA: Insufficient documentation

## 2016-08-27 DIAGNOSIS — Z86010 Personal history of colonic polyps: Secondary | ICD-10-CM | POA: Insufficient documentation

## 2016-08-27 DIAGNOSIS — T451X5A Adverse effect of antineoplastic and immunosuppressive drugs, initial encounter: Principal | ICD-10-CM

## 2016-08-27 DIAGNOSIS — Z7982 Long term (current) use of aspirin: Secondary | ICD-10-CM | POA: Diagnosis not present

## 2016-08-27 DIAGNOSIS — F17210 Nicotine dependence, cigarettes, uncomplicated: Secondary | ICD-10-CM | POA: Insufficient documentation

## 2016-08-27 MED ORDER — SODIUM CHLORIDE 0.9 % IJ SOLN
10.0000 mL | INTRAMUSCULAR | Status: DC | PRN
  Administered 2016-08-27: 10 mL via INTRAVENOUS
  Filled 2016-08-27: qty 10

## 2016-08-27 MED ORDER — HEPARIN SOD (PORK) LOCK FLUSH 100 UNIT/ML IV SOLN
500.0000 [IU] | Freq: Once | INTRAVENOUS | Status: AC | PRN
  Administered 2016-08-27: 500 [IU] via INTRAVENOUS
  Filled 2016-08-27: qty 5

## 2016-08-27 NOTE — Patient Instructions (Signed)

## 2016-08-27 NOTE — Progress Notes (Signed)
Consult Note: Gyn-Onc  Laurie Benson 50 y.o. female  CC:  Chief Complaint  Patient presents with  . Endometrial cancer    Follow up    HPI: Gao Mitnick initially presented with grade 2 vs serous endometrial cancer. The patient reports that in the past 12 months her previously regular menses have become heavy and irregular. This prompted Dr Marvel Plan to perform an ultrasound of the pelvis on 07/27/14 which revealed a uterus measuring 8x5x5cm with a 2cm intramural fibroid, and normal appearing ovaries. The endometrial thickness was 2cm. 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 She also reports a history of multiple colonic polyps and has had several resections of these. She is recommended to continue annual colonoscopies.   She underwent TRH/BSO and appropriate staging at [facility] on 09/15/14.   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%) (see comment)   Histologic grade: FIGO grade 3 (endometrioid adenocarcinoma: FIGO grade 2; serous carcinoma FIGO grade 3)  Tumor site: endometrium, anterior and posterior   Tumor size (gross): 6.5 x 2.8 x 0.8 cm   Myometrial invasion: Inner half  Depth: 2 mm Wall thickness: 2.2 cm Percent: 9% (A12)  Serosal involvement: not identified   Lower uterine segment involvement: not identified   Cervical involvement: not identified   Adnexal  involvement: not identified   Other involved sites: not applicable   Cervical/vaginal margin and distance: widely negative (>3 cm)   Lymphovascular space invasion: not identified   Regional lymph nodes (see other specimens): Total number involved: 0  Total number examined: 11  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 She was dispositioned to taxol and carboplatin x 6 cycles and vaginal brachytherapy. Her tumor was tested for microsatellite instability. The tumor was MSH-6 negative, MSH-2 positive, PMS-2 positive, and MLH-1 positive. 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   She completed chemotherapy from 12/15 through 02/15/15. She completed vaginal brachytherapy from 01/24/15 through 4-19/16.  She tolerated treatments well.  CT scan of the abdomen and pelvis on 04/16/15 revealed no evidence of persistent or recurrent or metastatic disease.   Interval History:  She has been doing well with no specific complaints. No vaginal bleeding. No new health concerns.  Review of Systems  Current Meds:  Outpatient Encounter Prescriptions as of 08/27/2016  Medication Sig  . acetaminophen (TYLENOL) 650 MG CR tablet Take 650 mg by mouth every 8 (eight) hours as needed for pain.  . ARIPiprazole (ABILIFY) 20 MG tablet Take 20 mg by mouth daily.  . clonazePAM (KLONOPIN) 0.1 mg/mL SUSP Take by mouth.  . [employer] ASPIRIN EC 325 MG EC tablet Take 325 mg by mouth daily.  Marland Kitchen docusate sodium (COLACE) 100 MG capsule Take 1 capsule (100 mg total) by mouth 2 (two) times daily.  Marland Kitchen donepezil (ARICEPT) 10 MG tablet Take 10 mg by mouth daily.  Marland Kitchen FLUoxetine (PROZAC) 20 MG capsule Take 60 mg by mouth.  . L-Lysine 500 MG CAPS Take 500 mg by mouth daily.  . pravastatin (PRAVACHOL) 40  MG tablet Take 40 mg by mouth daily.  . QUEtiapine (SEROQUEL) 100 MG tablet Take 100 mg by mouth at bedtime.  . thiamine (VITAMIN B-1) 100 MG tablet Take 100 mg by mouth daily.  .  traMADol (ULTRAM) 50 MG tablet Take 50 mg by mouth every 12 (twelve) hours as needed.  . vitamin B-12 (CYANOCOBALAMIN) 1000 MCG tablet Take 1 tablet (1,000 mcg total) by mouth daily.  . ferrous fumarate (HEMOCYTE - 106 MG FE) 325 (106 FE) MG TABS tablet Take 1 tablet (106 mg of iron total) by mouth daily. Take 1 tablet by mouth daily on empty stomach with orange juice. (Patient not taking: Reported on 08/27/2016)  . lidocaine-prilocaine (EMLA) cream Apply 1 application topically once as needed.  . nicotine (NICOTINE STEP 2) 14 mg/24hr patch Place 1 patch (14 mg total) onto the skin daily. (Patient not taking: Reported on 08/27/2016)   No facility-administered encounter medications on file as of 08/27/2016.     Allergy:  Allergies  Allergen Reactions  . Tetracyclines & Related Swelling    Social Hx:   Social History   Social History  . Marital status: Legally Separated    Spouse name: N/A  . Number of children: 6  . Years of education: N/A   Occupational History  . Not on file.   Social History Main Topics  . Smoking status: Heavy Tobacco Smoker    Packs/day: 0.50    Years: 14.00    Types: Cigarettes  . Smokeless tobacco: Never Used  . Alcohol use Yes     Comment: occ  . Drug use: No  . Sexual activity: Not Currently   Other Topics Concern  . Not on file   Social History Narrative  . No narrative on file    Past Surgical Hx:  Past Surgical History:  Procedure Laterality Date  . BREAST RECONSTRUCTION  11/20/2011   Procedure: BREAST RECONSTRUCTION;  Surgeon: Theodoro Kos, DO;  Location: [facility];  Service: Plastics;  Laterality: Bilateral;  bilateral implant exchange for breast reconstruction and capsulectomy  . BREAST SURGERY  2012   bilat mastectomy-rt snbx  . CESAREAN SECTION     x2  . LEG SURGERY Left 05/2016  . MULTIPLE TOOTH EXTRACTIONS    . PELVIC AND PARA-AORTIC LYMPH NODE DISSECTION  09/15/14  . ROBOTIC ASSISTED LAPAROSCOPIC HYSTERECTOMY  AND SALPINGECTOMY  09/15/14   at [facility] by Dr. Rogelio Seen  . TUBAL LIGATION      Past Medical Hx:  Past Medical History:  Diagnosis Date  . Anxiety   . Breast cancer ([place])   . Cancer ([place]) 2012   bilat br ca  . Complication of anesthesia    heard talking during teeth extraction  . Depression   . Family history of brain cancer   . Family history of colon cancer   . Lynch syndrome    MSH6 mutation  . Radiation 01/27/15, 01/30/15, 02/06/15, 02/13/15, 02/20/15   proximal vagina 30 gray  . Uterine cancer [facility])     Oncology Hx:    Breast cancer ([place])   11/20/2011 Initial Diagnosis    Breast cancer       Endometrial cancer ([place])   08/31/2014 Initial Diagnosis    Endometrial cancer      09/15/2014 Surgery    TRH/BSO and staging. IA UPSC       Chemotherapy          Family Hx:  Family History  Problem Relation Age of Onset  .  Colon cancer Father 63  . Brain cancer Paternal Uncle 51  . Cancer Paternal Grandmother     cancer of her "eyes"    Vitals:  Blood pressure 113/64, pulse 69, temperature 98.8 F (37.1 C), temperature source Oral, resp. rate 18, weight 135 lb 6.4 oz (61.4 kg), last menstrual period 09/04/2014, SpO2 100 %.  Physical Exam: Well-nourished well-developed female in no acute distress. She seems slightly sedated today.  Abdomen: Well-healed surgical incisions. Abdomen is soft and nontender.  Pelvic: Normal female genitalia. The vaginal cuff is visualized and completely healed. There are no masses. There is no tenderness.   Assessment/Plan: [name] 50 y.o. 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 She also carries a diagnosis of Lynch syndrome.    She is NED on today's exam and imaging.  CA 125 today.  I am recommending continuing q 3 monthly evaluations as part of surveillance until April 2018 afte which we will begin 6 monthly checks. I counseled her about concerning symptoms for recurrence and that she should notify my office if these appear before his  scheduled visit.  Donaciano Eva, MD 08/27/2016, 11:47 AM

## 2016-08-27 NOTE — Patient Instructions (Signed)
Plan to follow up with Dr. Denman George in six months or sooner if needed.  We will check a CA 125 today.  Please call for any questions or concerns.

## 2016-08-28 ENCOUNTER — Telehealth: Payer: Self-pay | Admitting: Gynecologic Oncology

## 2016-08-28 LAB — CA 125: Cancer Antigen (CA) 125: 5.8 U/mL (ref 0.0–38.1)

## 2016-08-28 NOTE — Telephone Encounter (Signed)
Patient informed of CA 125 results.  No questions voiced.  Advised to call for any needs or concerns.

## 2016-09-01 ENCOUNTER — Other Ambulatory Visit: Payer: Self-pay

## 2016-09-01 DIAGNOSIS — C541 Malignant neoplasm of endometrium: Secondary | ICD-10-CM

## 2016-09-01 MED ORDER — DOCUSATE SODIUM 100 MG PO CAPS: 100.0000 mg | ORAL_CAPSULE | Freq: Two times a day (BID) | ORAL | 2 refills | Status: DC

## 2016-10-07 DIAGNOSIS — Z9181 History of falling: Secondary | ICD-10-CM

## 2016-10-07 HISTORY — DX: History of falling: Z91.81

## 2016-10-20 DIAGNOSIS — G9389 Other specified disorders of brain: Secondary | ICD-10-CM

## 2016-10-20 HISTORY — DX: Other specified disorders of brain: G93.89

## 2016-10-30 ENCOUNTER — Other Ambulatory Visit: Payer: Self-pay | Admitting: Nurse Practitioner

## 2016-12-08 ENCOUNTER — Other Ambulatory Visit: Payer: Self-pay | Admitting: *Deleted

## 2016-12-08 DIAGNOSIS — C541 Malignant neoplasm of endometrium: Secondary | ICD-10-CM

## 2016-12-08 MED ORDER — VITAMIN B-12 1000 MCG PO TABS: 1000.0000 ug | ORAL_TABLET | Freq: Every day | ORAL | 11 refills | Status: DC

## 2016-12-19 ENCOUNTER — Other Ambulatory Visit: Payer: Self-pay | Admitting: Hematology and Oncology

## 2016-12-19 DIAGNOSIS — C541 Malignant neoplasm of endometrium: Secondary | ICD-10-CM

## 2016-12-22 ENCOUNTER — Other Ambulatory Visit (HOSPITAL_BASED_OUTPATIENT_CLINIC_OR_DEPARTMENT_OTHER): Payer: Medicare Other

## 2016-12-22 ENCOUNTER — Ambulatory Visit (HOSPITAL_BASED_OUTPATIENT_CLINIC_OR_DEPARTMENT_OTHER): Payer: Medicare Other | Admitting: Hematology and Oncology

## 2016-12-22 ENCOUNTER — Ambulatory Visit (HOSPITAL_BASED_OUTPATIENT_CLINIC_OR_DEPARTMENT_OTHER): Payer: Medicare Other

## 2016-12-22 ENCOUNTER — Encounter: Payer: Self-pay | Admitting: Hematology and Oncology

## 2016-12-22 DIAGNOSIS — C719 Malignant neoplasm of brain, unspecified: Secondary | ICD-10-CM

## 2016-12-22 DIAGNOSIS — R4189 Other symptoms and signs involving cognitive functions and awareness: Secondary | ICD-10-CM

## 2016-12-22 DIAGNOSIS — C541 Malignant neoplasm of endometrium: Secondary | ICD-10-CM

## 2016-12-22 DIAGNOSIS — Z86000 Personal history of in-situ neoplasm of breast: Secondary | ICD-10-CM | POA: Diagnosis not present

## 2016-12-22 HISTORY — DX: Malignant neoplasm of brain, unspecified: C71.9

## 2016-12-22 HISTORY — DX: Other symptoms and signs involving cognitive functions and awareness: R41.89

## 2016-12-22 LAB — CBC WITH DIFFERENTIAL/PLATELET
BASO%: 0.5 % (ref 0.0–2.0)
Basophils Absolute: 0 10*3/uL (ref 0.0–0.1)
EOS ABS: 0.2 10*3/uL (ref 0.0–0.5)
EOS%: 3.6 % (ref 0.0–7.0)
HCT: 34.9 % (ref 34.8–46.6)
HGB: 11.4 g/dL — ABNORMAL LOW (ref 11.6–15.9)
LYMPH%: 34.9 % (ref 14.0–49.7)
MCH: 29.5 pg (ref 25.1–34.0)
MCHC: 32.7 g/dL (ref 31.5–36.0)
MCV: 90.2 fL (ref 79.5–101.0)
MONO#: 0.3 10*3/uL (ref 0.1–0.9)
MONO%: 5.5 % (ref 0.0–14.0)
NEUT%: 55.5 % (ref 38.4–76.8)
NEUTROS ABS: 3.1 10*3/uL (ref 1.5–6.5)
PLATELETS: 209 10*3/uL (ref 145–400)
RBC: 3.87 10*6/uL (ref 3.70–5.45)
RDW: 14.1 % (ref 11.2–14.5)
WBC: 5.5 10*3/uL (ref 3.9–10.3)
lymph#: 1.9 10*3/uL (ref 0.9–3.3)

## 2016-12-22 LAB — COMPREHENSIVE METABOLIC PANEL
ALT: 8 U/L (ref 0–55)
AST: 12 U/L (ref 5–34)
Albumin: 3.7 g/dL (ref 3.5–5.0)
Alkaline Phosphatase: 74 U/L (ref 40–150)
Anion Gap: 9 mEq/L (ref 3–11)
BUN: 11.2 mg/dL (ref 7.0–26.0)
CHLORIDE: 111 meq/L — AB (ref 98–109)
CO2: 23 meq/L (ref 22–29)
Calcium: 9.6 mg/dL (ref 8.4–10.4)
Creatinine: 0.8 mg/dL (ref 0.6–1.1)
EGFR: 82 mL/min/{1.73_m2} — ABNORMAL LOW (ref >90)
GLUCOSE: 104 mg/dL (ref 70–140)
POTASSIUM: 3.7 meq/L (ref 3.5–5.1)
SODIUM: 143 meq/L (ref 136–145)
Total Bilirubin: 0.22 mg/dL (ref 0.20–1.20)
Total Protein: 6.5 g/dL (ref 6.4–8.3)

## 2016-12-22 MED ORDER — HEPARIN SOD (PORK) LOCK FLUSH 100 UNIT/ML IV SOLN
500.0000 [IU] | Freq: Once | INTRAVENOUS | Status: AC
  Administered 2016-12-22: 500 [IU]
  Filled 2016-12-22: qty 5

## 2016-12-22 MED ORDER — SODIUM CHLORIDE 0.9% FLUSH
10.0000 mL | Freq: Once | INTRAVENOUS | Status: AC
  Administered 2016-12-22: 10 mL
  Filled 2016-12-22: qty 10

## 2016-12-22 NOTE — Assessment & Plan Note (Signed)
The recent diagnosis of glioblastoma came out as a surprise on chart review prior to today's visit. I did not expect to address this issue. She has all her chemo/RT treatment prescribed through physicians at [facility] but she is currently receiving treatment at the regional hospital at [facility] due to closer distance. I will attempt to consult with her navigator to have all her oncology appointment consolidated to [facility] in the future.

## 2016-12-22 NOTE — Progress Notes (Signed)
[facility] FOLLOW-UP progress notes  Patient Care Team: Imagene Riches, NP as PCP - General  CHIEF COMPLAINTS/PURPOSE OF VISIT:  Follow-up here for diagnosis of endometrial cancer  HISTORY OF PRESENTING ILLNESS:  Laurie Benson 51 y.o. female was transferred to my care after her prior physician has left.  I reviewed the patient's records extensive and collaborated the history with the patient. Summary of her history is as follows:   History of ductal carcinoma in situ (DCIS) of breast   12/25/2010 Pathology Results    Lymph node, sentinel, biopsy, right #1 ONE BENIGN LYMPH NODE (0/1). 2. Lymph node, sentinel, biopsy, right #2 ONE BENIGN LYMPH NODE (0/1). 3. Breast, simple mastectomy, left COLUMNAR CELL CHANGE WITH ATYPIA AND CALCIFICATIONS. FIBROADENOMA. PSEUDOANGIOMATOUS STROMAL HYPERPLASIA ([place]). NO INVASIVE CARCINOMA. 4. Breast, simple mastectomy, right HIGH GRADE DUCTAL CARCINOMA IN SITU WITH NECROSIS AND CALCIFICATIONS INVOLVING AN AREA 3.5 CM. DUCTAL CARCINOMA IN SITU FOCALLY 0.1 CM FROM THE SUPERIOR-ANTERIOR SOFT TISSUE MARGIN AND 2.2 CM FROM THE DEEP MARGIN.      11/20/2011 Pathology Results    Scar -, left mastectomy BENIGN SKIN WITH FIBROSIS CONSISTENT WITH SCAR. NO EVIDENCE OF MALIGNANCY. 2. Breast, capsule, left lateral BENIGN FIBROADIPOSE TISSUE WITH FIBROSIS AND HISTIOCYTIC INFILTRATE. NO EVIDENCE OF MALIGNANCY. 3. Scar -, right mastectomy BENIGN SKIN WITH FIBROSIS CONSISTENT WITH SCAR. NO EVIDENCE OF MALIGNANCY. 4. Breast, capsule, right lateral BENIGN FIBROADIPOSE TISSUE WITH FIBROSIS AND HISTIOCYTIC INFILTRATE. NO EVIDENCE OF MALIGNANCY.       Endometrial cancer ([place])   07/27/2014 Initial Diagnosis    The patient reports heavy and irregular menses. This prompted Dr Marvel Plan to perform an ultrasound of the pelvis on 07/27/14 which revealed a uterus measuring 8x5x5cm with a 2cm intramural fibroid, and normal appearing ovaries. The endometrial  thickness was 2cm. 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      09/04/2014 Imaging    Inhomogeneous endometrium which may correspond to the reported history of endometrial cancer, but poorly evaluated at CT. No CT evidence for intra-abdominal or pelvic metastatic disease. However, there is an ill-defined hypodense possible posterior segment right hepatic lobe mass, for which abdominal MRI with contrast according to liver mass protocol is recommended for further evaluation. 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%)(see comment)   Histologic grade:FIGO grade 3 (endometrioid adenocarcinoma: FIGO grade 2; serous carcinoma FIGO grade 3)  Tumor site:endometrium, anterior and posterior   Tumor size (gross):6.5 x 2.8 x 0.8 cm   Myometrial invasion: Inner half  Depth:2 mmWall thickness:2.2 cmPercent: 9% (A12) (see comment)  Serosal involvement:not identified   Lower uterine segment involvement:not identified   Cervical involvement:not identified   Adnexal involvement:not identified    Other involved sites:not applicable   Cervical/vaginal margin and distance:widely negative (>3 cm)  Lymphovascular space invasion:not identified   Regional lymph nodes (see  other specimens): Total number involved:0 Total number examined:11  Additional pathologic findings: Right ovary:luteinized cyst, corpus luteum, scant adhesions and endosalpingiosis  Left ovary:luteinized hemorrhagic cyst, endosalpingiosis and dystrophic calcifications  Right fallopian tube:Hydrosalpinx; no definite fimbria identified. Reactive epithelium (see comment) Left fallopian tube:chronic salpingitis and scant adhesions; no fimbria identified Myometrium: adenomyosis and uterine serosal adhesions Cervix: Nabothian cysts and parakeratosis  AJCC Pathologic Stage: pT1a       09/21/2014 Genetic Testing    Her tumor was tested for microsatellite instability. The tumor was MSH-6 negative, MSH-2 positive, PMS-2 positive, and MLH-1 positive. 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       11/01/2014 - 02/15/2015 Chemotherapy    She received 6 cycles of adjuvant carbo/taxol      01/01/2015 Procedure    Placement of a subcutaneous port device. Catheter tip at the superior cavoatrial junction and ready to be used.      01/24/2015 - 02/20/2015 Radiation Therapy    She completed vaginal brachytherapy from 01/24/15 through 4-19/16.       04/16/2015 Imaging    Unremarkable CT evaluation of the abdomen and pelvis. Specifically, no findings to raise concern for metastatic disease at this time      12/24/2015 Imaging    CT head was negative      01/03/2016 Imaging    MRI head negative for metastatic disease      01/07/2016 Tumor Marker    Patient's tumor was tested for the following markers: CA125. 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 Follow-up brain MRI in 4-6 months was recommended.           09/26/2016 Imaging    Repeat Brain MRI revealed that the mass had increased in size to 2.2 cm. She was then referred for brain biopsy (wanted to wait until after the holidays).          11/06/2016 Imaging    MRI brain: Inferior frontal glioblastoma has a stable size and appearance compared to 11/06/2014. 2. Interval changes of biopsy. 3. Interval right maxillary sinusitis and mastoiditis.      11/06/2016 Imaging    MRI brain with contrast showed mildly increased size and enhancement of right frontal mass. 2. No new lesions      11/07/2016 - 11/09/2016 Hospital Admission    OR Procedures: Computer guided right frontal brain mass biopsy (11/07/2016)          11/08/2016 Imaging    Ct head with contrast: Right frontal approach biopsy of right posteromedial frontal lobe mass with a tiny focus of air along the biopsy tract and punctate hemorrhage at the site of biopsy. No significant mass effect, extra-axial collection, or evidence for stroke is identified. 2. Residual enhancing mass at site of biopsy is stable in comparison with prior MRI given differences in technique. No additional abnormal enhancement of the brain.      12/15/2016 -  Radiation Therapy    The patient begun concurrent chemo radiation therapy with Temodar.  She was also prescribed Olaparib by Dr. Eugenia Pancoast      She  is here today accompanied by her friend. She is supposed to be seen here because of prior history of endometrial cancer but upon chart review, I noticed that she was recently diagnosed with glioblastoma. She is currently  receiving concurrent chemoradiation therapy, prescribed by her physicians from [facility]. She is receiving treatment at [facility] due to closer distance of travel. She has a port flush today. She denies pain from recent surgery. She has some numbness on her head but she denies recent seizures or new neurological deficits  MEDICAL HISTORY:  Past Medical History:  Diagnosis Date  . Anxiety   . Breast cancer ([place])   . Cancer ([place]) 2012   bilat br ca  . Complication of anesthesia    heard talking during teeth extraction  . Depression   . Family history of brain cancer   . Family history of colon cancer   . Lynch syndrome    MSH6 mutation  . Radiation 01/27/15, 01/30/15, 02/06/15, 02/13/15, 02/20/15   proximal vagina 30 gray  . Uterine cancer [facility])     SURGICAL HISTORY: Past Surgical History:  Procedure Laterality Date  . BREAST RECONSTRUCTION  11/20/2011   Procedure: BREAST RECONSTRUCTION;  Surgeon: Theodoro Kos, DO;  Location: [facility];  Service: Plastics;  Laterality: Bilateral;  bilateral implant exchange for breast reconstruction and capsulectomy  . BREAST SURGERY  2012   bilat mastectomy-rt snbx  . CESAREAN SECTION     x2  . LEG SURGERY Left 05/2016  . MULTIPLE TOOTH EXTRACTIONS    . PELVIC AND PARA-AORTIC LYMPH NODE DISSECTION  09/15/14  . ROBOTIC ASSISTED LAPAROSCOPIC HYSTERECTOMY AND SALPINGECTOMY  09/15/14   at [facility] by Dr. Rogelio Seen  . TUBAL LIGATION      SOCIAL HISTORY: Social History   Social History  . Marital status: Single    Spouse name: N/A  . Number of children: 6  . Years of education: N/A   Occupational History  . Not on file.   Social History Main Topics  . Smoking status: Heavy Tobacco Smoker    Packs/day: 0.50    Years: 14.00    Types: Cigarettes  . Smokeless tobacco: Never Used  . Alcohol use Yes     Comment: occ  . Drug use: No  . Sexual activity: Not Currently   Other Topics Concern  . Not on  file   Social History Narrative  . No narrative on file    FAMILY HISTORY: Family History  Problem Relation Age of Onset  . Colon cancer Father 37  . Brain cancer Paternal Uncle 102  . Cancer Paternal Grandmother     cancer of her "eyes"    ALLERGIES:  is allergic to tetracyclines & related.  MEDICATIONS:  Current Outpatient Prescriptions  Medication Sig Dispense Refill  . acetaminophen (TYLENOL) 650 MG CR tablet Take 650 mg by mouth every 8 (eight) hours as needed for pain.    . ARIPiprazole (ABILIFY) 20 MG tablet Take 20 mg by mouth daily.    . clonazePAM (KLONOPIN) 0.1 mg/mL SUSP Take by mouth.    . docusate sodium (COLACE) 100 MG capsule Take 1 capsule (100 mg total) by mouth 2 (two) times daily. 60 capsule 2  . ferrous fumarate (HEMOCYTE - 106 MG FE) 325 (106 FE) MG TABS tablet Take 1 tablet (106 mg of iron total) by mouth daily. Take 1 tablet by mouth daily on empty stomach with orange juice. 30 each 4  . FLUoxetine (  PROZAC) 20 MG capsule Take 60 mg by mouth.  2  . L-Lysine 500 MG CAPS Take 500 mg by mouth daily.    . Olaparib 150 MG TABS 1 tab PO QHS on Mon, Tues, Weds while on radiation    . ondansetron (ZOFRAN) 8 MG tablet One tab PO 30 minutes prior to chemo and q8h PRN nausea    . pravastatin (PRAVACHOL) 40 MG tablet Take 40 mg by mouth daily.  0  . QUEtiapine (SEROQUEL) 100 MG tablet Take 100 mg by mouth at bedtime.    . temozolomide (TEMODAR) 100 MG capsule 1 cap + 1 cap x 20 mg (total dose 120 mg) PO qd 30 min after zofran 1 hr prior to RT during the wk, @ QHS on wknd for 42 consecutive days    . temozolomide (TEMODAR) 20 MG capsule 1 cap + 1 cap x 100 mg (total dose 120 mg) PO qd 30 min after zofran 1 hr prior to RT during the wk, @ QHS on wknd for 42 consecutive days    . thiamine (VITAMIN B-1) 100 MG tablet Take 100 mg by mouth daily.    . traMADol (ULTRAM) 50 MG tablet Take 50 mg by mouth every 12 (twelve) hours as needed.  0  . vitamin B-12 (CYANOCOBALAMIN) 1000  MCG tablet Take 1 tablet (1,000 mcg total) by mouth daily. 30 tablet 11  . vitamin C (ASCORBIC ACID) 500 MG tablet Take 500 mg by mouth.    . [facility] ASPIRIN EC 325 MG EC tablet Take 325 mg by mouth daily.  0  . donepezil (ARICEPT) 10 MG tablet Take 10 mg by mouth daily.    Marland Kitchen lidocaine-prilocaine (EMLA) cream Apply 1 application topically once as needed.    . nicotine (NICOTINE STEP 2) 14 mg/24hr patch Place 1 patch (14 mg total) onto the skin daily. (Patient not taking: Reported on 08/27/2016) 30 patch 3   No current facility-administered medications for this visit.     REVIEW OF SYSTEMS:   Constitutional: Denies fevers, chills or abnormal night sweats Eyes: Denies blurriness of vision, double vision or watery eyes Ears, nose, mouth, throat, and face: Denies mucositis or sore throat Respiratory: Denies cough, dyspnea or wheezes Cardiovascular: Denies palpitation, chest discomfort or lower extremity swelling Gastrointestinal:  Denies nausea, heartburn or change in bowel habits Skin: Denies abnormal skin rashes Lymphatics: Denies new lymphadenopathy or easy bruising Neurological:Denies numbness, tingling or new weaknesses Behavioral/Psych: Mood is stable, no new changes  All other systems were reviewed with the patient and are negative.  PHYSICAL EXAMINATION: ECOG PERFORMANCE STATUS: 1 - Symptomatic but completely ambulatory  Vitals:   12/22/16 0951  BP: 127/66  Pulse: 85  Resp: 18  Temp: 98.2 F (36.8 C)   Filed Weights   12/22/16 0951  Weight: 128 lb 6.4 oz (58.2 kg)    GENERAL:alert, no distress and comfortable.  Her head is bandaged from prior surgery SKIN: skin color, texture, turgor are normal, no rashes or significant lesions EYES: normal, conjunctiva are pink and non-injected, sclera clear OROPHARYNX:no exudate, normal lips, buccal mucosa, and tongue  NECK: supple, thyroid normal size, non-tender, without nodularity LYMPH:  no palpable lymphadenopathy in the cervical,  axillary or inguinal LUNGS: clear to auscultation and percussion with normal breathing effort HEART: regular rate & rhythm and no murmurs without lower extremity edema ABDOMEN:abdomen soft, non-tender and normal bowel sounds Musculoskeletal:no cyanosis of digits and no clubbing  PSYCH: alert & oriented x 3 with fluent speech NEURO:  no focal motor/sensory deficits  LABORATORY DATA:  I have reviewed the data as listed Lab Results  Component Value Date   WBC 5.5 12/22/2016   HGB 11.4 (L) 12/22/2016   HCT 34.9 12/22/2016   MCV 90.2 12/22/2016   PLT 209 12/22/2016    Recent Labs  01/07/16 1051 07/10/16 1003 12/22/16 0924  NA 144 146* 143  K 4.1 4.3 3.7  CO2 26 25 23   GLUCOSE 110 82 104  BUN 10.4 12.3 11.2  CREATININE 0.8 0.8 0.8  CALCIUM 8.9 9.1 9.6  PROT 6.3* 6.6 6.5  ALBUMIN 3.4* 3.4* 3.7  AST 20 29 12   ALT 16 14 8   ALKPHOS 48 76 94  BILITOT <0.30 <0.30 0.22   ASSESSMENT & PLAN:  History of ductal carcinoma in situ (DCIS) of breast She has completed all treatment related to history of breast cancer because she had mastectomy.  No further workup is needed  Endometrial cancer [facility]) She follows closely with GYN oncologist. Another oncologist at [facility] has prescribed Olaparib I recommend follow-up with GYN oncologist and her oncologist at [facility] to consolidate all her treatment in one place Her Port-A-Cath is flushed today here. 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 I did not expect to address this issue. She has all her chemo/RT treatment prescribed through physicians at [facility] but she is currently receiving treatment at the regional hospital at [facility] due to closer distance. I will attempt to consult with her navigator to have all her oncology appointment consolidated to [facility] in  the future.   No orders of the defined types were placed in this encounter.   All questions were answered. The patient knows to call the clinic with any problems, questions or concerns. I spent 40 minutes counseling the patient face to face. The total time spent in the appointment was 60 minutes and more than 50% was on counseling.     Heath Lark, MD 12/22/2016 10:25 AM

## 2016-12-22 NOTE — Assessment & Plan Note (Signed)
She has completed all treatment related to history of breast cancer because she had mastectomy.  No further workup is needed

## 2016-12-22 NOTE — Assessment & Plan Note (Signed)
She follows closely with GYN oncologist. Another oncologist at [facility] has prescribed Olaparib I recommend follow-up with GYN oncologist and her oncologist at [facility] to consolidate all her treatment in one place Her Port-A-Cath is flushed today here. I recommend consideration to remove Port-A-Cath in the future

## 2016-12-23 LAB — CA 125: Cancer Antigen (CA) 125: 5.2 U/mL (ref 0.0–38.1)

## 2016-12-23 NOTE — Unmapped (Signed)
12/23/16 Per Dr Donne Hazel I asked pt is she was taking olaparib 150 mg and she said I have not taken this in a long time Dr Donne Hazel made aware. Dr Donne Hazel said that Dr Theodoro Kalata prescribed this for her 11/26/16  and she is supposed to be taking. [facility] called [facility] [address] [phone] and they said that they received the prescription but that pt had not picked it up yet..  I called Ms Portilla and told her the [facility] and she said she would  have her friend Lura Em take her to pick it up today.  Dr Donne Hazel made aware.

## 2017-01-13 DIAGNOSIS — M25511 Pain in right shoulder: Secondary | ICD-10-CM | POA: Insufficient documentation

## 2017-02-11 ENCOUNTER — Telehealth: Payer: Self-pay | Admitting: *Deleted

## 2017-02-11 NOTE — Telephone Encounter (Signed)
Patient called and rescheduled from Friday to May 9th. I have mailed the patient a calendar per her request

## 2017-02-13 ENCOUNTER — Ambulatory Visit: Payer: Medicare Other | Admitting: Gynecologic Oncology

## 2017-02-18 DIAGNOSIS — G43909 Migraine, unspecified, not intractable, without status migrainosus: Secondary | ICD-10-CM | POA: Insufficient documentation

## 2017-02-18 HISTORY — DX: Migraine, unspecified, not intractable, without status migrainosus: G43.909

## 2017-03-11 ENCOUNTER — Encounter: Payer: Self-pay | Admitting: Gynecologic Oncology

## 2017-03-11 ENCOUNTER — Ambulatory Visit: Payer: Medicare Other | Attending: Gynecologic Oncology | Admitting: Gynecologic Oncology

## 2017-03-11 VITALS — BP 109/63 | HR 84 | Temp 98.5°F | Resp 18 | Wt 128.5 lb

## 2017-03-11 DIAGNOSIS — Z923 Personal history of irradiation: Secondary | ICD-10-CM | POA: Diagnosis not present

## 2017-03-11 DIAGNOSIS — Z9221 Personal history of antineoplastic chemotherapy: Secondary | ICD-10-CM | POA: Diagnosis not present

## 2017-03-11 DIAGNOSIS — Z79899 Other long term (current) drug therapy: Secondary | ICD-10-CM | POA: Insufficient documentation

## 2017-03-11 DIAGNOSIS — Z800 Family history of malignant neoplasm of digestive organs: Secondary | ICD-10-CM | POA: Diagnosis not present

## 2017-03-11 DIAGNOSIS — Z8542 Personal history of malignant neoplasm of other parts of uterus: Secondary | ICD-10-CM

## 2017-03-11 DIAGNOSIS — Z90722 Acquired absence of ovaries, bilateral: Secondary | ICD-10-CM | POA: Diagnosis not present

## 2017-03-11 DIAGNOSIS — Z1509 Genetic susceptibility to other malignant neoplasm: Secondary | ICD-10-CM

## 2017-03-11 DIAGNOSIS — F17210 Nicotine dependence, cigarettes, uncomplicated: Secondary | ICD-10-CM | POA: Diagnosis not present

## 2017-03-11 DIAGNOSIS — Z9013 Acquired absence of bilateral breasts and nipples: Secondary | ICD-10-CM | POA: Insufficient documentation

## 2017-03-11 DIAGNOSIS — Z08 Encounter for follow-up examination after completed treatment for malignant neoplasm: Secondary | ICD-10-CM | POA: Insufficient documentation

## 2017-03-11 DIAGNOSIS — Z881 Allergy status to other antibiotic agents status: Secondary | ICD-10-CM | POA: Diagnosis not present

## 2017-03-11 DIAGNOSIS — C541 Malignant neoplasm of endometrium: Secondary | ICD-10-CM

## 2017-03-11 DIAGNOSIS — F329 Major depressive disorder, single episode, unspecified: Secondary | ICD-10-CM | POA: Diagnosis not present

## 2017-03-11 DIAGNOSIS — Z86010 Personal history of colonic polyps: Secondary | ICD-10-CM | POA: Insufficient documentation

## 2017-03-11 DIAGNOSIS — Z87898 Personal history of other specified conditions: Secondary | ICD-10-CM | POA: Diagnosis not present

## 2017-03-11 DIAGNOSIS — Z90710 Acquired absence of both cervix and uterus: Secondary | ICD-10-CM | POA: Insufficient documentation

## 2017-03-11 DIAGNOSIS — C719 Malignant neoplasm of brain, unspecified: Secondary | ICD-10-CM | POA: Insufficient documentation

## 2017-03-11 DIAGNOSIS — F419 Anxiety disorder, unspecified: Secondary | ICD-10-CM | POA: Diagnosis not present

## 2017-03-11 NOTE — Patient Instructions (Signed)
Plan to follow up in November or sooner if needed.

## 2017-03-11 NOTE — Progress Notes (Signed)
Consult Note: Gyn-Onc  Laurie Benson 51 y.o. female  CC:  Chief Complaint  Patient presents with  . Endometrial Cancer   Assessment/Plan: Laurie Benson 51 y.o. 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 She also carries a diagnosis of Lynch syndrome.  Recent diagnosis of glioblastoma (undergoing therapy at [facility] with Dr Malva Limes and Dr Eugenia Pancoast.  She is NED on today's exam and imaging.  I am recommending continuing q 6 monthly evaluations as part of surveillance until April 2021 after which we will begin 6 monthly checks. I counseled her about concerning symptoms for recurrence and that she should notify my office if these appear before his scheduled visit.  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 The patient reports that in the past 12 months her previously regular menses have become heavy and irregular. This prompted Dr Marvel Plan to perform an ultrasound of the pelvis on 07/27/14 which revealed a uterus measuring 8x5x5cm with a 2cm intramural fibroid, and normal appearing ovaries. The endometrial thickness was 2cm. 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 She also reports a history of multiple colonic polyps and has had several resections of these. She is recommended to continue annual colonoscopies.   She underwent TRH/BSO and appropriate staging at [facility] with Dr Alycia Rossetti on 09/15/14.   Pathology revealed: Diagnosis: A: Uterus, cervix, bilateral tubes and ovaries, hysterectomy and bilateral salpingo-oophorectomy  Histologic type: mixed  subtype adenocarcinoma: endometrioid adenocarcinoma (70%) and 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%) (see comment)   Histologic grade: FIGO grade 3 (endometrioid adenocarcinoma: FIGO grade 2; serous carcinoma FIGO grade 3)  Tumor site: endometrium, anterior and posterior   Tumor size (gross): 6.5 x 2.8 x 0.8 cm   Myometrial invasion: Inner half  Depth: 2 mm Wall thickness: 2.2 cm Percent: 9% (A12)  Serosal involvement: not identified   Lower uterine segment involvement: not identified   Cervical involvement: not identified   Adnexal involvement: not identified   Other involved sites: not applicable   Cervical/vaginal margin and distance: widely negative (>3 cm)   Lymphovascular space invasion: not identified   Regional lymph nodes (see other specimens): Total number involved: 0  Total number examined: 11  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 She was dispositioned to taxol and carboplatin x 6 cycles and vaginal brachytherapy. Her tumor was tested for microsatellite instability. The tumor was MSH-6 negative, MSH-2 positive, PMS-2 positive, and MLH-1 positive. 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   She completed chemotherapy from 12/15 through 02/15/15. She completed vaginal brachytherapy from 01/24/15 through 4-19/16.  She tolerated treatments well.  CT scan of the abdomen and pelvis on 04/16/15 revealed no evidence of persistent or recurrent or metastatic disease.   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 She is currently undergoing radiation for this.  Review of Systems  Current Meds:  Outpatient Encounter Prescriptions as of 03/11/2017  Medication Sig  . acetaminophen (TYLENOL) 650 MG CR tablet Take 650 mg by mouth every 8 (eight) hours as needed for pain.  . ARIPiprazole (ABILIFY) 20 MG  tablet Take 20 mg by mouth daily.  . clonazePAM (KLONOPIN) 0.1 mg/mL  SUSP Take by mouth.  . docusate sodium (COLACE) 100 MG capsule Take 1 capsule (100 mg total) by mouth 2 (two) times daily.  Marland Kitchen donepezil (ARICEPT) 10 MG tablet Take 10 mg by mouth daily.  . ferrous fumarate (HEMOCYTE - 106 MG FE) 325 (106 FE) MG TABS tablet Take 1 tablet (106 mg of iron total) by mouth daily. Take 1 tablet by mouth daily on empty stomach with orange juice.  Marland Kitchen FLUoxetine (PROZAC) 20 MG capsule Take 60 mg by mouth.  . L-Lysine 500 MG CAPS Take 500 mg by mouth daily.  Marland Kitchen lidocaine-prilocaine (EMLA) cream Apply 1 application topically once as needed.  . nicotine (NICOTINE STEP 2) 14 mg/24hr patch Place 1 patch (14 mg total) onto the skin daily.  . Olaparib 150 MG TABS 1 tab PO QHS on Mon, Tues, Weds while on radiation  . ondansetron (ZOFRAN) 8 MG tablet One tab PO 30 minutes prior to chemo and q8h PRN nausea  . QUEtiapine (SEROQUEL) 100 MG tablet Take 100 mg by mouth at bedtime.  . temozolomide (TEMODAR) 100 MG capsule 1 cap + 1 cap x 20 mg (total dose 120 mg) PO qd 30 min after zofran 1 hr prior to RT during the wk, @ QHS on wknd for 42 consecutive days  . temozolomide (TEMODAR) 20 MG capsule 1 cap + 1 cap x 100 mg (total dose 120 mg) PO qd 30 min after zofran 1 hr prior to RT during the wk, @ QHS on wknd for 42 consecutive days  . thiamine (VITAMIN B-1) 100 MG tablet Take 100 mg by mouth daily.  . traMADol (ULTRAM) 50 MG tablet Take 50 mg by mouth every 12 (twelve) hours as needed.  . vitamin B-12 (CYANOCOBALAMIN) 1000 MCG tablet Take 1 tablet (1,000 mcg total) by mouth daily.  . vitamin C (ASCORBIC ACID) 500 MG tablet Take 500 mg by mouth.  . [DISCONTINUED] [employer] ASPIRIN EC 325 MG EC tablet Take 325 mg by mouth daily.  . [DISCONTINUED] pravastatin (PRAVACHOL) 40 MG tablet Take 40 mg by mouth daily.   No facility-administered encounter medications on file as of 03/11/2017.     Allergy:  Allergies  Allergen Reactions  . Tetracyclines & Related Swelling    Social Hx:   Social  History   Social History  . Marital status: Single    Spouse name: N/A  . Number of children: 6  . Years of education: N/A   Occupational History  . Not on file.   Social History Main Topics  . Smoking status: Heavy Tobacco Smoker    Packs/day: 0.50    Years: 14.00    Types: Cigarettes  . Smokeless tobacco: Never Used  . Alcohol use Yes     Comment: occ  . Drug use: No  . Sexual activity: Not Currently   Other Topics Concern  . Not on file   Social History Narrative  . No narrative on file    Past Surgical Hx:  Past Surgical History:  Procedure Laterality Date  . BREAST RECONSTRUCTION  11/20/2011   Procedure: BREAST RECONSTRUCTION;  Surgeon: Theodoro Kos, DO;  Location: [facility];  Service: Plastics;  Laterality: Bilateral;  bilateral implant exchange for breast reconstruction and capsulectomy  . BREAST SURGERY  2012   bilat mastectomy-rt snbx  . CESAREAN SECTION     x2  . LEG SURGERY Left 05/2016  . MULTIPLE TOOTH  EXTRACTIONS    . PELVIC AND PARA-AORTIC LYMPH NODE DISSECTION  09/15/14  . ROBOTIC ASSISTED LAPAROSCOPIC HYSTERECTOMY AND SALPINGECTOMY  09/15/14   at [facility] by Dr. Rogelio Seen  . TUBAL LIGATION      Past Medical Hx:  Past Medical History:  Diagnosis Date  . Anxiety   . Breast cancer ([place])   . Cancer ([place]) 2012   bilat br ca  . Complication of anesthesia    heard talking during teeth extraction  . Depression   . Family history of brain cancer   . Family history of colon cancer   . Lynch syndrome    MSH6 mutation  . Radiation 01/27/15, 01/30/15, 02/06/15, 02/13/15, 02/20/15   proximal vagina 30 gray  . Uterine cancer [facility])     Oncology Hx:    History of ductal carcinoma in situ (DCIS) of breast   12/25/2010 Pathology Results    Lymph node, sentinel, biopsy, right #1 ONE BENIGN LYMPH NODE (0/1). 2. Lymph node, sentinel, biopsy, right #2 ONE BENIGN LYMPH NODE (0/1). 3. Breast, simple mastectomy, left COLUMNAR CELL CHANGE WITH ATYPIA  AND CALCIFICATIONS. FIBROADENOMA. PSEUDOANGIOMATOUS STROMAL HYPERPLASIA ([place]). NO INVASIVE CARCINOMA. 4. Breast, simple mastectomy, right HIGH GRADE DUCTAL CARCINOMA IN SITU WITH NECROSIS AND CALCIFICATIONS INVOLVING AN AREA 3.5 CM. DUCTAL CARCINOMA IN SITU FOCALLY 0.1 CM FROM THE SUPERIOR-ANTERIOR SOFT TISSUE MARGIN AND 2.2 CM FROM THE DEEP MARGIN.      11/20/2011 Pathology Results    Scar -, left mastectomy BENIGN SKIN WITH FIBROSIS CONSISTENT WITH SCAR. NO EVIDENCE OF MALIGNANCY. 2. Breast, capsule, left lateral BENIGN FIBROADIPOSE TISSUE WITH FIBROSIS AND HISTIOCYTIC INFILTRATE. NO EVIDENCE OF MALIGNANCY. 3. Scar -, right mastectomy BENIGN SKIN WITH FIBROSIS CONSISTENT WITH SCAR. NO EVIDENCE OF MALIGNANCY. 4. Breast, capsule, right lateral BENIGN FIBROADIPOSE TISSUE WITH FIBROSIS AND HISTIOCYTIC INFILTRATE. NO EVIDENCE OF MALIGNANCY.       Endometrial cancer ([place])   07/27/2014 Initial Diagnosis    The patient reports heavy and irregular menses. This prompted Dr Marvel Plan to perform an ultrasound of the pelvis on 07/27/14 which revealed a uterus measuring 8x5x5cm with a 2cm intramural fibroid, and normal appearing ovaries. The endometrial thickness was 2cm. 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      09/04/2014 Imaging    Inhomogeneous endometrium which may correspond to the reported history of endometrial cancer, but poorly evaluated at CT. No CT evidence for intra-abdominal or pelvic metastatic disease. However, there is an ill-defined hypodense possible posterior segment right hepatic lobe mass, for which abdominal MRI with contrast according to liver mass protocol is recommended for further evaluation. This could be artifactual due to dense streak artifact from oral contrast, but  could be definitively characterized at MRI      09/15/2014 Surgery    She underwent ROBOTIC LAPAROSCOPY, SURGICAL, WITH TOTAL HYSTERECTOMY, FOR UTERUS 250 G OR LESS; W/REMOVAL TUBE(S) AND/OR OVARY(S), ROBOTIC LAPAROSCOPY, SURGICAL; WITH BILATERAL TOTAL PELVIC LYMPHADENECTOMY PERI-AORTIC LYMPH NODE SAMPLE,         09/15/2014 Pathology Results    A:Uterus, cervix, bilateral tubes and ovaries, hysterectomy and bilateral salpingo-oophorectomy  Histologic type:mixed subtype adenocarcinoma: endometrioid adenocarcinoma (70%) and 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%)(see comment)   Histologic grade:FIGO grade 3 (endometrioid adenocarcinoma: FIGO grade 2; serous carcinoma FIGO grade 3)  Tumor site:endometrium, anterior and posterior   Tumor size (gross):6.5 x 2.8 x 0.8 cm  Myometrial invasion: Inner half  Depth:2 mmWall thickness:2.2 cmPercent: 9% (A12) (see comment)  Serosal involvement:not identified   Lower uterine segment involvement:not identified   Cervical involvement:not identified   Adnexal involvement:not identified   Other involved sites:not applicable   Cervical/vaginal margin and distance:widely negative (>3 cm)  Lymphovascular space invasion:not identified   Regional lymph nodes (see other specimens): Total number involved:0 Total number examined:11  Additional pathologic findings: Right ovary:luteinized cyst, corpus luteum, scant adhesions and endosalpingiosis  Left ovary:luteinized hemorrhagic cyst, endosalpingiosis and dystrophic calcifications  Right fallopian tube:Hydrosalpinx; no definite fimbria identified. Reactive epithelium (see comment) Left fallopian tube:chronic salpingitis and scant adhesions; no fimbria identified Myometrium:  adenomyosis and uterine serosal adhesions Cervix: Nabothian cysts and parakeratosis  AJCC Pathologic Stage: pT1a       09/21/2014 Genetic Testing    Her tumor was tested for microsatellite instability. The tumor was MSH-6 negative, MSH-2 positive, PMS-2 positive, and MLH-1 positive. 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       11/01/2014 - 02/15/2015 Chemotherapy    She received 6 cycles of adjuvant carbo/taxol      01/01/2015 Procedure    Placement of a subcutaneous port device. Catheter tip at the superior cavoatrial junction and ready to be used.      01/24/2015 - 02/20/2015 Radiation Therapy    She completed vaginal brachytherapy from 01/24/15 through 4-19/16.       04/16/2015 Imaging    Unremarkable CT evaluation of the abdomen and pelvis. Specifically, no findings to raise concern for metastatic disease at this time      12/24/2015 Imaging    CT head was negative      01/03/2016 Imaging    MRI head negative for metastatic disease      01/07/2016 Tumor Marker    Patient's tumor was tested for the following markers: CA125. Results of the tumor marker test revealed 6.0      08/27/2016 Tumor Marker    Patient's tumor was tested for the following markers: CA127 Results of the tumor marker test revealed 5.8       Glioblastoma multiforme ([place])   02/28/2016 Imaging    Short-interval Brain MRI revealed stability in the T2 signal abnormality in the right frontal lobe with a new questionable subtle punctate focus of associated enhancement. Follow-up brain MRI in 4-6 months was recommended.           09/26/2016 Imaging    Repeat Brain MRI revealed that the mass had increased in size to 2.2 cm. She was then referred for brain biopsy (wanted to wait until after the holidays).          11/06/2016 Imaging    MRI brain: Inferior frontal glioblastoma has a stable size and appearance compared to 11/06/2014. 2. Interval changes of biopsy. 3.  Interval right maxillary sinusitis and mastoiditis.      11/06/2016 Imaging    MRI brain with contrast showed mildly increased size and enhancement of right frontal mass. 2. No new lesions      11/07/2016 - 11/09/2016 Hospital Admission    OR Procedures: Computer guided right frontal brain mass biopsy (11/07/2016)          11/08/2016 Imaging    Ct head with contrast: Right frontal approach biopsy of right posteromedial frontal lobe mass with a tiny focus of air along the biopsy tract and punctate hemorrhage at the site of biopsy. No significant mass effect, extra-axial collection, or evidence for stroke is identified. 2. Residual  enhancing mass at site of biopsy is stable in comparison with prior MRI given differences in technique. No additional abnormal enhancement of the brain.      12/15/2016 -  Radiation Therapy    The patient begun concurrent chemo radiation therapy with Temodar.  She was also prescribed Olaparib by Dr. Eugenia Pancoast       Family Hx:  Family History  Problem Relation Age of Onset  . Colon cancer Father 107  . Brain cancer Paternal Uncle 3  . Cancer Paternal Grandmother     cancer of her "eyes"    Vitals:  Blood pressure 109/63, pulse 84, temperature 98.5 F (36.9 C), temperature source Oral, resp. rate 18, weight 128 lb 8 oz (58.3 kg), last menstrual period 09/04/2014.  Physical Exam: Well-nourished well-developed female in no acute distress. She seems slightly sedated today.  Abdomen: Well-healed surgical incisions. Abdomen is soft and nontender.  Pelvic: Normal female genitalia. The vaginal cuff is visualized. There are no masses. There is no tenderness.    Donaciano Eva, MD 03/11/2017, 4:23 PM

## 2017-04-13 DIAGNOSIS — M25512 Pain in left shoulder: Secondary | ICD-10-CM | POA: Insufficient documentation

## 2017-04-13 DIAGNOSIS — R42 Dizziness and giddiness: Secondary | ICD-10-CM | POA: Insufficient documentation

## 2017-05-11 ENCOUNTER — Ambulatory Visit
Admission: RE | Admit: 2017-05-11 | Discharge: 2017-05-11 | Disposition: A | Discharge status: Home / Self Care | Payer: MEDICARE

## 2017-05-11 ENCOUNTER — Ambulatory Visit
Admission: RE | Admit: 2017-05-11 | Discharge: 2017-05-11 | Disposition: A | Discharge status: Home / Self Care | Payer: MEDICARE | Attending: Student in an Organized Health Care Education/Training Program | Admitting: Student in an Organized Health Care Education/Training Program

## 2017-05-11 DIAGNOSIS — C719 Malignant neoplasm of brain, unspecified: Principal | ICD-10-CM

## 2017-05-11 DIAGNOSIS — D701 Agranulocytosis secondary to cancer chemotherapy: Secondary | ICD-10-CM

## 2017-05-11 DIAGNOSIS — T451X5A Adverse effect of antineoplastic and immunosuppressive drugs, initial encounter: Secondary | ICD-10-CM

## 2017-05-11 DIAGNOSIS — G43809 Other migraine, not intractable, without status migrainosus: Secondary | ICD-10-CM

## 2017-05-11 DIAGNOSIS — R413 Other amnesia: Secondary | ICD-10-CM

## 2017-05-11 MED ORDER — TEMOZOLOMIDE 140 MG CAPSULE
ORAL_CAPSULE | 1 refills | 0 days | Status: CP
Start: 2017-05-11 — End: 2017-07-13

## 2017-05-11 MED ORDER — TEMOZOLOMIDE 180 MG CAPSULE
ORAL_CAPSULE | 1 refills | 0 days | Status: CP
Start: 2017-05-11 — End: 2017-07-13

## 2017-05-18 MED FILL — TEMOZOLOMIDE/180MG/CAPS: TEMOZOLOMIDE/180MG/CAPS | 28 days supply | Qty: 5 | Fill #0

## 2017-05-18 MED FILL — TEMOZOLOMIDE/140MG/CAPS: TEMOZOLOMIDE/140MG/CAPS | 28 days supply | Qty: 5 | Fill #0

## 2017-05-21 NOTE — Unmapped (Signed)
Navigator contacted pt to discuss how things are going for her.  Asked pt if she had received a replacement OPTUNE unit, as she had reported the original unit stopped working.  Pt stated she does have a new unit, however has not been compliant for past 2 days.  Pt states she was out of town and forgot to take unit with her.  This morning as she was going to apply  head piece, she noticed small blisters.  Pt reports back of head is irritated and there appears to be small blisters.  Navigator asked if pt has used any products -cream, lotion , new shampoo or soap  - pt stated no. Navigator will make physician aware.  Navigator will contact pt later today to F/up

## 2017-05-22 ENCOUNTER — Ambulatory Visit
Admission: RE | Admit: 2017-05-22 | Discharge: 2017-06-02 | Disposition: A | Discharge status: Home / Self Care | Payer: MEDICARE

## 2017-05-22 DIAGNOSIS — Z510 Encounter for antineoplastic radiation therapy: Principal | ICD-10-CM

## 2017-05-22 DIAGNOSIS — C710 Malignant neoplasm of cerebrum, except lobes and ventricles: Secondary | ICD-10-CM

## 2017-05-22 MED ORDER — CEPHALEXIN 500 MG CAPSULE
ORAL_CAPSULE | Freq: Four times a day (QID) | ORAL | 0 refills | 0 days | Status: CP
Start: 2017-05-22 — End: 2017-06-01

## 2017-05-22 NOTE — Unmapped (Signed)
BP 114/70  - Pulse 82  - Temp 37.1 ??C (98.8 ??F) (Oral)  - Resp 20  - Wt 59.4 kg (131 lb)  - LMP 09/13/2014  - SpO2 97%  - BMI 23.21 kg/m??      Patient here with new rash like symptoms to back of head from optune unit    Small pink pimple like lesions to back of head, patient states they hurt and they are itchy.    Patient has not used Optune unit in 2 days.  Optune not aware of new rash    Patient also started oral chemo last night

## 2017-05-22 NOTE — Unmapped (Signed)
RADIATION ONCOLOGY FOLLOW UP NOTE    Patient Name: Brandi Morales  Encounter Date: 05/22/2017    Medical Record Number: [id number]  Date of Birth: July 15, 1966    Referring Physician: No ref. provider found    Primary Care Provider: Dema Severin, NP    ASSESSMENT:  1. 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 Keep device off through Saturday.  Stop alcohol to this area. Use neosporin.   Dr. Francee Gentile nurse will follow up with her on Monday.     TREATMENT SUMMARY: 60 Gy to the her right frontal tumor with temodar and optune completed 02/05/17    INTERVAL HISTORY:  Brandi Morales returns today for an unscheduled follow-up visit. She complains of a rsh on the back of her head starting night before last. She states this is painful and itchy. She has been using alcohol on it to keep it dry. This burns and then this area feels better. She has not been taking any pain medications for this. She has not had her OPTune device on for 2 days. She has no other areas of irritation. She denies any fevers or chills. The device company without last week to make some modifications due to fit issues.    PHYSICAL EXAM:  BP 114/70  - Pulse 82  - Temp 37.1 ??C (98.8 ??F) (Oral)  - Resp 20  - Wt 59.4 kg (131 lb)  - LMP 09/13/2014  - SpO2 97%  - BMI 23.21 kg/m??   Karnofsky/Lansky Performance Status:  90,  Able to carry on normal activity; minor signs or symptoms of disease (ECOG equivalent 0)  Pain Assessment:   of 10  CONSTITUTIONAL: Cognition is within normal limits. Speech is fluent and coherent. Memory appears intact for both recent and remote events.  HEAD:  Normocephalic. -In her posterior scalp she has an area of pink skin and slight swelling measuring about 5 x 3 cm. This has some scaly dry skin above it. There is 1 small open area laterally but is not bleeding  EYES:  Sclerae anicteric. Pupils are equal, round and reactive to light. EOM are intact.    RADIOLOGY:   Mri Brain W Wo Contrast    Result Date: 05/11/2017  EXAM: Magnetic resonance imaging, brain, without and with contrast material. DATE: 05/11/2017 2:48 PM ACCESSION: [id number] UN DICTATED: 05/11/2017 2:56 PM INTERPRETATION LOCATION: Main [facility]     CLINICAL INDICATION: 51 years old Female with OTHER-C71.9-Glioblastoma (CMS-HCC)      COMPARISON: 02/17/2017     TECHNIQUE: Multiplanar, multisequence MR imaging of the brain was performed without and with I.V. contrast.     FINDINGS:  Sequela of right frontal approach biopsy. There is further decrease in size of the heterogenous signal, enhancing lesions along the inferior paramedian frontal lobe. The largest lesion measures 1.2 x 0.5 x 0.7 cm (22:94), previously 1.5 x 0.6 x 1.2 cm (AP x TV x CC). Reduced size of surrounding increased T2/FLAIR signal consistent with edema. No new enhancing lesions are identified.     No midline shift, acute infarct, or extra-axial fluid collection.          -Further decreased size of enhancing lesions in the paramedian, inferior right frontal lobe with decreased surrounding T2/FLAIR signal. No new foci of enhancement.        LABS:  None    CA 125   Date Value Ref Range Status   09/07/2014 4.7 0.0 -  34.9 U/mL Final       Lab Results   Component Value Date    WBC 7.8 05/11/2017    HGB 12.7 05/11/2017    HCT 37.9 05/11/2017    PLT 228 05/11/2017    CREATININE 0.87 05/11/2017    AST 14 05/11/2017    ALT 20 05/11/2017       Electronically signed by:  Lurline Hare, MD  Radiation Oncology   05/22/2017  3:35 PM

## 2017-05-25 NOTE — Unmapped (Signed)
05/25/17 12:30 pm Per Dr Michell Heinrich I called pt and she said her scan is less itchy and taking keflex as prescribed. Pt said she will resume using Optune device today since she has someone there to help her with it today. Dr Michell Heinrich and Dr Donne Hazel made aware.

## 2017-05-25 NOTE — Unmapped (Signed)
Navigator contacted pt this morning to check on condition of skin.  Pt states she had prescription  filled and her skin is much better, less itchy.  Navigator also asked about her Temodar and pt states she takes last dose tonight (MON July 23)  She is out of town - spending a few days in [place].  Navigator encouraged her to call if she needs anything.

## 2017-06-03 NOTE — Unmapped (Signed)
Navigator contacted pt to remind her of two upcoming lab appts that are scheduled for her when she returns from her daughter's wedding.  She is out of town August 1- August 9.  She has an appt for lab draw on Aug 10 and again on Aug 15.  Left msg, will continue to F/up

## 2017-06-11 MED FILL — TEMOZOLOMIDE/180MG/CAPS: TEMOZOLOMIDE/180MG/CAPS | 28 days supply | Qty: 5 | Fill #1

## 2017-06-11 MED FILL — TEMOZOLOMIDE/140MG/CAPS: TEMOZOLOMIDE/140MG/CAPS | 28 days supply | Qty: 5 | Fill #1

## 2017-06-11 NOTE — Unmapped (Signed)
Navigator contacted pt to remind her of Lab appt on FRI AUG 10.  Pt can come any time in the afternoon.  She stated she will be there.  Pt is returning tonight from her daughter's wedding.

## 2017-06-11 NOTE — Unmapped (Signed)
Specialty Pharmacy Refill Coordination Note     Brandi Morales is a 51 y.o. female contacted today regarding refills of her specialty medication(s).    Reviewed and verified with patient:      Specialty medication(s) and dose(s) confirmed: yes  Changes to medications: no  Changes to insurance: no    Medication Adherence    Medication Assistance Program  Refill Coordination  Has the Patient's Contact Information Changed:  No  Is the Shipping Address Different:  No  Shipping Information  Delivery Scheduled:  Yes  Delivery Date:  06/12/17  Medications to be Shipped:  TEMOZOLOMIDE          Follow-up: 3 week(s)     Mitzi Davenport  Specialty Pharmacy Technician

## 2017-06-12 ENCOUNTER — Ambulatory Visit
Admission: RE | Admit: 2017-06-12 | Discharge: 2017-06-12 | Disposition: A | Discharge status: Home / Self Care | Payer: MEDICARE

## 2017-06-12 DIAGNOSIS — C719 Malignant neoplasm of brain, unspecified: Principal | ICD-10-CM

## 2017-06-12 LAB — COMPREHENSIVE METABOLIC PANEL
ALBUMIN: 3.7 g/dL (ref 3.5–5.0)
ALKALINE PHOSPHATASE: 88 U/L (ref 38–126)
ANION GAP: 8 mmol/L — ABNORMAL LOW (ref 9–15)
AST (SGOT): 22 U/L (ref 14–38)
BILIRUBIN TOTAL: 0.2 mg/dL (ref 0.0–1.2)
BLOOD UREA NITROGEN: 7 mg/dL (ref 7–21)
BUN / CREAT RATIO: 10
CALCIUM: 9.3 mg/dL (ref 8.5–10.2)
CHLORIDE: 105 mmol/L (ref 98–107)
CREATININE: 0.7 mg/dL (ref 0.60–1.00)
EGFR MDRD AF AMER: 106 mL/min/{1.73_m2} (ref >=60)
EGFR MDRD NON AF AMER: 88 mL/min/{1.73_m2} (ref >=60)
GLUCOSE RANDOM: 85 mg/dL (ref 65–179)
POTASSIUM: 4 mmol/L (ref 3.5–5.0)
PROTEIN TOTAL: 6.6 g/dL (ref 6.6–8.0)
SODIUM: 142 mmol/L (ref 135–145)

## 2017-06-12 LAB — HEMATOCRIT: Lab: 36.5

## 2017-06-12 LAB — CBC W/ AUTO DIFF
BASOPHILS ABSOLUTE COUNT: 0 10*9/L (ref 0.0–0.1)
BASOPHILS RELATIVE PERCENT: 0.6 %
EOSINOPHILS ABSOLUTE COUNT: 0.1 10*9/L (ref 0.0–0.7)
HEMATOCRIT: 36.5 % (ref 36.0–46.0)
HEMOGLOBIN: 12.6 g/dL (ref 12.0–15.0)
IMMATURE GRANULOCYTES ABSOLUTE COUNT: 0 10*9/L (ref 0.0–1.0)
IMMATURE GRANULOCYTES RELATIVE PERCENT: 0.3 %
LYMPHOCYTES ABSOLUTE COUNT: 1.5 10*9/L (ref 0.7–4.0)
LYMPHOCYTES RELATIVE PERCENT: 22.3 %
MEAN CORPUSCULAR HEMOGLOBIN: 32.7 pg (ref 26.0–34.0)
MEAN CORPUSCULAR VOLUME: 94.8 fL (ref 78.0–100.0)
MEAN PLATELET VOLUME: 9.4 fL (ref 8.8–12.7)
MONOCYTES ABSOLUTE COUNT: 0.6 10*9/L (ref 0.1–1.0)
MONOCYTES RELATIVE PERCENT: 8.7 %
NEUTROPHILS RELATIVE PERCENT: 66.1 %
PLATELET COUNT: 198 10*9/L (ref 150–400)
RED BLOOD CELL COUNT: 3.85 10*12/L — ABNORMAL LOW (ref 3.87–5.11)
RED CELL DISTRIBUTION WIDTH: 13.9 % (ref 11.5–15.5)
WBC ADJUSTED: 6.6 10*9/L (ref 4.0–10.5)

## 2017-06-12 LAB — CO2: Carbon dioxide:SCnc:Pt:Ser/Plas:Qn:: 29

## 2017-06-12 NOTE — Unmapped (Signed)
06/12/17  12:15 pm Pt came to our office today. Said she had to have labs done today. Pt directed  to 2nd floor Hem/Onc office to register. Myrtie Soman, Hem/Onc office and they were expecting her. Pt did not have Optune device on. Pt. Said  I have not worn it for a long time  Her hair has grown and pt said she was back from her daughter's wedding and going to shave head tonight and resume Optune therapy. Dr Donne Hazel made aware.

## 2017-06-29 NOTE — Unmapped (Signed)
Navigator called pt to F/up.  Pt was unavailable. Pt  has returned from daughter's wedding and Navigator wants to ask about Optune use.  Pt missed an appt for labs.  Navigator lft msg encouraging pt to call

## 2017-06-29 NOTE — Unmapped (Signed)
Per [facility], pt to be reevaluated in clinic 07/13/17.

## 2017-07-01 ENCOUNTER — Ambulatory Visit
Admit: 2017-07-01 | Discharge: 2017-07-03 | Disposition: A | Discharge status: Home / Self Care | Attending: Radiation Oncology | Admitting: Radiation Oncology

## 2017-07-01 NOTE — Unmapped (Signed)
Navigator called pt to F/up.  No answer, lft vmail encouraging pt to let us know how she is doing.  Will continue to follow

## 2017-07-03 NOTE — Unmapped (Signed)
Pt contacted Navigator to let her know she is doing well.  Pt is aware she has an MR in [place] on SEP 10.  Pt states she is hoping it is smaller on this scan.  Navigator will continue to F/up

## 2017-07-13 ENCOUNTER — Ambulatory Visit
Admission: RE | Admit: 2017-07-13 | Discharge: 2017-07-13 | Disposition: A | Discharge status: Home / Self Care | Payer: MEDICARE

## 2017-07-13 ENCOUNTER — Ambulatory Visit
Admission: RE | Admit: 2017-07-13 | Discharge: 2017-07-13 | Disposition: A | Discharge status: Home / Self Care | Payer: MEDICARE | Attending: Student in an Organized Health Care Education/Training Program | Admitting: Student in an Organized Health Care Education/Training Program

## 2017-07-13 DIAGNOSIS — C719 Malignant neoplasm of brain, unspecified: Principal | ICD-10-CM

## 2017-07-13 LAB — COMPREHENSIVE METABOLIC PANEL
ALBUMIN: 3.6 g/dL (ref 3.5–5.0)
ALKALINE PHOSPHATASE: 65 U/L (ref 38–126)
ALT (SGPT): 36 U/L (ref 15–48)
ANION GAP: 7 mmol/L — ABNORMAL LOW (ref 9–15)
AST (SGOT): 18 U/L (ref 14–38)
BILIRUBIN TOTAL: 0.2 mg/dL (ref 0.0–1.2)
BLOOD UREA NITROGEN: 6 mg/dL — ABNORMAL LOW (ref 7–21)
BUN / CREAT RATIO: 9
CALCIUM: 9 mg/dL (ref 8.5–10.2)
CHLORIDE: 107 mmol/L (ref 98–107)
CREATININE: 0.68 mg/dL (ref 0.60–1.00)
EGFR MDRD AF AMER: >=60 mL/min/{1.73_m2} (ref >=60)
EGFR MDRD NON AF AMER: >=60 mL/min/{1.73_m2} (ref >=60)
GLUCOSE RANDOM: 96 mg/dL (ref 65–179)
POTASSIUM: 3.9 mmol/L (ref 3.5–5.0)
PROTEIN TOTAL: 6.2 g/dL — ABNORMAL LOW (ref 6.5–8.3)
SODIUM: 143 mmol/L (ref 135–145)

## 2017-07-13 LAB — WBC ADJUSTED: Lab: 4.3 — ABNORMAL LOW

## 2017-07-13 LAB — CBC W/ AUTO DIFF
BASOPHILS ABSOLUTE COUNT: 0 10*9/L (ref 0.0–0.1)
EOSINOPHILS ABSOLUTE COUNT: 0.2 10*9/L (ref 0.0–0.4)
HEMATOCRIT: 34.6 % — ABNORMAL LOW (ref 36.0–46.0)
LARGE UNSTAINED CELLS: 3 % (ref 0–4)
LYMPHOCYTES ABSOLUTE COUNT: 1.3 10*9/L — ABNORMAL LOW (ref 1.5–5.0)
MEAN CORPUSCULAR HEMOGLOBIN CONC: 33.5 g/dL (ref 31.0–37.0)
MEAN CORPUSCULAR HEMOGLOBIN: 33.1 pg (ref 26.0–34.0)
MEAN CORPUSCULAR VOLUME: 99 fL (ref 80.0–100.0)
MEAN PLATELET VOLUME: 7.7 fL (ref 7.0–10.0)
MONOCYTES ABSOLUTE COUNT: 0.2 10*9/L (ref 0.2–0.8)
NEUTROPHILS ABSOLUTE COUNT: 2.3 10*9/L (ref 2.0–7.5)
PLATELET COUNT: 216 10*9/L (ref 150–440)
RED BLOOD CELL COUNT: 3.5 10*12/L — ABNORMAL LOW (ref 4.00–5.20)
WBC ADJUSTED: 4.3 10*9/L — ABNORMAL LOW (ref 4.5–11.0)

## 2017-07-13 LAB — EGFR MDRD NON AF AMER: Glomerular filtration rate/1.73 sq M.predicted.non black:ArVRat:Pt:Ser/Plas/Bld:Qn:Creatinine-based formula (MDRD): >=60

## 2017-07-13 MED ORDER — TEMOZOLOMIDE 140 MG CAPSULE
ORAL_CAPSULE | 1 refills | 0 days | Status: CP
Start: 2017-07-13 — End: 2017-10-15

## 2017-07-13 MED ORDER — TEMOZOLOMIDE 180 MG CAPSULE
ORAL_CAPSULE | 1 refills | 0 days | Status: CP
Start: 2017-07-13 — End: 2017-10-15

## 2017-07-13 MED ORDER — DOCUSATE SODIUM 100 MG CAPSULE
ORAL_CAPSULE | Freq: Every day | ORAL | 3 refills | 0 days | Status: CP
Start: 2017-07-13 — End: 2018-07-13

## 2017-07-13 MED ORDER — FERROUS SULFATE 325 MG (65 MG IRON) TABLET
ORAL_TABLET | Freq: Three times a day (TID) | ORAL | 3 refills | 0.00000 days | Status: CP
Start: 2017-07-13 — End: 2017-11-18

## 2017-07-13 NOTE — Unmapped (Signed)
[facility] [employer] [facility]  [facility]  [facility]    Return Appointment    Demographics:  Patient Name: Brandi Morales  MRN: [id number]  DOB: [birth year]  Age: 51 y.o.    Identifying Statement:  Brandi Morales is a 51 y.o. 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     Assessment and Recommendations:  Brandi Morales is a pleasant 51 y.o. 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 Pathology revealed a glioblastoma; however, molecular markers are unavailable for risk assessment. Notably, she is BRCA2-mutated. She has completed a total of 6000 cGy of radiation with concurrent temozolomide. Additionally, per Radene Journey, et al. (Journal of Clinical Oncology 35, no. 15_suppl (May 2017) 2022-2022), she had completed concurrent olaparib while on chemoradiation.     Today, her KPS is 80. Her exam is stable. Has been exhibiting unusual behaviors at home, with restlessness and forgetfulness, talking to her deceased child, not bothersome to patient but hard for her son to witness as he recently moved back in, partially to help care for her. No longer using Optune as she was difficulty keeping it in place. Her MRI reveals a continued partial response, with decrease in size and enhancement of the parafalcine lesion and adjacent inferomedial right frontal enhancement.     We discussed these results. Lab work is stable. We discussed the need for her to remain compliant with D21 and D28 labs and with temozolomide as prescribed.    Continuing adjuvant maintenance temozolomide D1-5/28D for cycle 4 (Stupp et al. Kavin Leech. 2005). We discussed most common hematologic, gastrointestinal, hepatic, and physical side effects of therapy, as well as means to mitigate some of these side effects. I will continue until intolerance/disease progression for a total of 12 cycles of chemotherapy. Rationale for serial imaging was discussed. Patient has self-discontinued Optune.    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 of severe mood disorders (could also be medication induced given polypharmacy with multiple centrally acting medications    Chemotherapy induced neutropenia:  - resolved    Anemia (2/2 previous carbo/taxol + iron deficiency):  - likely from prior pelvic RT    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 The patient and family verbalized understanding of the discussion and plan; all posed questions were answered to their apparent satisfaction. The patient and family were advised to contact us with any questions or concerns that arise prior to their next appointment.    Patient  was seen and discussed with Dr. Theodoro Kalata.    Brandi Coon MD  Hematology/Oncology Fellow    Oncology History    Identifying Statement:  Brandi Morales is a 51 y.o. female diagnosed with a glioblastoma (WHO IV IDH WT).     Molecular Markers:  Ki67: unknown  MGMT promotor: unknown  IDH: wild type (per IHC)  TERT promotor: unknown    STRATA:  ATM p.G1016X  TP53 p.R181H  TP53 p.B147W Treatment History:  11/25/16: initial consult at [facility]; recommended chemoRT + olaparib  02/05/17: completes 6 weeks of chemoRT + olaparib  02/17/17: KPS 90; MRI stable; ANC 1200; recheck labs and if >1500, start TMZ at 150 mg/m2 for C1  03/13/17 - 04/06/17: Optune compliance 60%  05/11/17: KPS 90; MRI stable; labs stable; will incr TMZ to 200 mg/m2  07/07/17: KPS 90; MRI stable; labs stable; continue C4 TMZ        Endometrial cancer (RAF-HCC)    09/15/2014 Initial Diagnosis     Endometrial cancer (RAF-HCC)         09/15/2014 Surgery     Robotic-assisted total laparoscopic hysterectomy, bilateral salpingo-oophorectomy, bilateral pelvic and para-aortic lymphadenectomy         04/30/2015 Genetics     BRCA2, ERBB2 and PIK3CA mutation           DCIS (ductal carcinoma in situ) of breast    03/04/2015 Initial Diagnosis     DCIS (ductal carcinoma in situ) of breast         Glioblastoma (WHO IV) of the right frontal lobe    11/25/2016 Initial Diagnosis     Glioblastoma (WHO IV) of the right frontal lobe        Interval History:  Brandi Morales is a 51 y.o. year old right handed female who presents for follow up for a glioblastoma. She has completed C3 and started C4 on 06/13/17. She has discontinued Optune approximately one month ago due to difficulty keeping it in place, shocks to her scalp and overall hassle. Son recently moved home and has noticed that she seems restless and in a daze some days, very forgetful but also impulsive, for example buying things she does not need. Will get up in the night and go through things in a room, seemingly without purpose. Son has also noticed her to be talking to her deceased daughter. Other days, she seems to be doing okay. She continues on many centrally acting medications without any recent changes. Does endorse smoking marijuana each morning to help with nausea and appetite. Feels weak overall, and presyncopal at times. At times will trip and stumble around the house, although has not had any falls. Ambulates independently. When talking to Ms. Broder, she says she thinks she is doing quite well. She is not significantly bothered by her symptoms and is glad to be doing as well as she is.    Review of Systems:  As noted within the interval history; otherwise, negative on 12 system review.    Allergies:  Allergies   Allergen Reactions   ??? Tetracyclines Swelling     SWELLING OF THROAT, TROUBLE SWALLOWING ONLY, NO TROUBLE BREATHING     Medications:  Current Outpatient Prescriptions   Medication Sig Dispense Refill   ??? ARIPiprazole (ABILIFY) 20 MG tablet Take 20 mg by mouth every morning.      ??? ascorbic acid, vitamin C, (ASCORBIC ACID WITH ROSE HIPS) 500 MG tablet Take 1 tablet (500 mg total) by mouth daily. (Patient taking  differently: Take 500 mg by mouth every morning. ) 90 tablet 3   ??? aspirin 325 MG delayed release (EC) tablet Take 325 mg by mouth daily.  2   ??? biotin 5,000 mcg TbDL Take 1 tablet by mouth every morning.      ??? clonazePAM (KLONOPIN) 2 MG tablet Take 2 mg by mouth Three (3) times a day as needed for anxiety.      ??? cyanocobalamin (VITAMIN B-12) 1000 MCG tablet Take 1,000 mcg by mouth every morning.      ??? docusate sodium (COLACE) 100 MG capsule TAKE 1 CAPSULE (100 MG TOTAL) BY MOUTH DAILY. WHEN TAKING IRON. 30 capsule 3   ??? donepezil (ARICEPT) 10 MG tablet Take 10 mg by mouth Two (2) times a day.     ??? ferrous sulfate 325 (65 FE) MG tablet TAKE 1 TABLET (325 MG TOTAL) BY MOUTH THREE (3) TIMES A DAY WITH A MEAL. 90 tablet 3   ??? FLUoxetine (PROZAC) 20 MG capsule Take 60 mg by mouth every morning.     ??? levETIRAcetam (KEPPRA) 500 MG tablet Take 1 tablet (500 mg total) by mouth Two (2) times a day. 60 tablet 11   ??? lidocaine (LIDODERM) 5 % patch APPLY 1 PATCH EVERY 12-24 HOURS TO LEFT CHEST WALL NEURALGIA/ H/O CANCER  2   ??? loratadine (CLARITIN) 10 mg tablet Take 10 mg by mouth every morning.      ??? meloxicam (MOBIC) 15 MG tablet Take 15 mg by mouth every morning.     ??? nortriptyline (PAMELOR) 25 MG capsule Take 1 capsule (25 mg total) by mouth nightly. 30 capsule 3   ??? omega 3-dha-epa-fish oil (FISH OIL) 1,000 mg (120 mg-180 mg) cap Take 1 capsule by mouth every morning.     ??? oxyCODONE-acetaminophen (PERCOCET) 7.5-325 mg per tablet 1 TABLET EVERY 12 HOURS AS NEEDED FOR CHRONIC PAIN  0   ??? pravastatin (PRAVACHOL) 40 MG tablet Take 40 mg by mouth daily.      ??? pregabalin (LYRICA) 75 MG capsule Take 75 mg by mouth Two (2) times a day.     ??? QUEtiapine (SEROQUEL) 100 MG tablet Take 300 mg by mouth nightly.      ??? rosuvastatin (CRESTOR) 40 MG tablet Take 40 mg by mouth daily.  0   ??? temozolomide (TEMODAR) 140 MG capsule 1 cap + 1 cap x 180 mg (total dose 320 mg) PO 30 min after zofran @ QHS for 5 consecutive days every 28 days 5 capsule 1   ??? temozolomide (TEMODAR) 180 MG capsule 1 cap +  1 cap x 140 mg (total dose 320 mg) PO 30 min after zofran @ [facility] for 5 consecutive days every 28 days 5 capsule 1   ??? thiamine (B-1) 100 MG tablet Take 100 mg by mouth daily with breakfast.     ??? benzonatate (TESSALON PERLES) 100 MG capsule Take 1 capsule (100 mg total) to 2 capsules (200 mg total) by mouth Three (3) times a day as needed for cough. (Patient not taking: Reported on 07/13/2017) 40 capsule 1   ??? ondansetron (ZOFRAN) 8 MG tablet One tab PO 30 minutes prior to chemo and q8h PRN nausea 60 tablet 3     No current facility-administered medications for this visit.      Past Medical History:  Past Medical History:   Diagnosis Date   ??? Anemia    ??? Anxiety    ??? Arthritis    ??? Breast  cancer (CMS-HCC) PRIOR TO 2015   ??? Depression    ??? Endometrial cancer (CMS-HCC) 2015    CHEMO AND RADIATION   ??? History of transfusion      2 PINTS PER PATIENT   ??? Hyperlipidemia    ??? Osteoporosis    ??? Sciatic nerve pain     BILATERAL   ??? Trouble walking     DUE TO HIP PAIN   ??? Visual impairment     blurry vision both eyes     Past Surgical History:  Past Surgical History:   Procedure Laterality Date   ??? BREAST RECONSTRUCTION Bilateral     BREAST RECONSTRUCTION SAME TIME AS MASTECTOMY   ??? CESAREAN SECTION      x2   ??? FRACTURE SURGERY Left     KNEE FRACTURE REPAIR   ??? MASTECTOMY Bilateral    ??? MULTIPLE TOOTH EXTRACTIONS      UPPER AND LOWER   ??? PORTACATH PLACEMENT     ??? PR LAP,PELVIC LYMPHADENECTOMY/BX Bilateral 09/15/2014    Procedure: ROBOTIC LAPAROSCOPY, SURGICAL; WITH BILATERAL TOTAL PELVIC LYMPHADENECTOMY PERI-AORTIC LYMPH NODE SAMPLE, SGL/MULT;  Surgeon: Thompson Caul, MD;  Location: MAIN OR [facility];  Service: Gynecology Oncology   ??? PR LAPAROSCOPY W TOT HYSTERECTUTERUS <=250 GRAM  W TUBE/OVARY Bilateral 09/15/2014    Procedure: ROBOTIC LAPAROSCOPY, SURGICAL, WITH TOTAL HYSTERECTOMY, FOR UTERUS 250 G OR LESS; W/REMOVAL TUBE(S) AND/OR OVARY(S);  Surgeon: Thompson Caul, MD;  Location: MAIN OR [facility];  Service: Gynecology Oncology   ??? PR STEREO BX/ASPIR/EXCIS,INTRACRANIAL LESN Right 11/07/2016    Procedure: STEREOTACTIC GUIDED BRAIN BIOPSY OF RIGHT FRONTAL BRAIN MASS;  Surgeon: Marny Lowenstein, MD;  Location: OR [facility];  Service: Neurosurgery   ??? PR STEREOTACTIC COMP ASSIST PROC,CRANIAL,INTRADURAL N/A 11/07/2016    Procedure: STEREOTACTIC COMPUTER-ASSISTED (NAVIGATIONAL) PROCEDURE; CRANIAL, INTRADURAL;  Surgeon: Marny Lowenstein, MD;  Location: OR [facility];  Service: Neurosurgery   ??? TUBAL LIGATION       Social History:  Social History     Social History   ??? Marital status: Divorced     Spouse name: N/A   ??? Number of children: N/A   ??? Years of education: N/A     Occupational History   ??? Not on [employer].     Social History Main Topics   ??? Smoking status: Current Every Day Smoker     Packs/day: 0.50     Years: 10.00     Types: e-Cigarettes   ??? Smokeless tobacco: Never Used      Comment: SMOKES ABOUT 4-5 CIG/DAY IN ADDITION TO E-CIGARETTES   ??? Alcohol use No   ??? Drug use: Yes     Types: Marijuana   ??? Sexual activity: Not on Vernisha Bacote     Other Topics Concern   ??? Exercise No   ??? Living Situation No     Social History Narrative   ??? No narrative on Calleen Alvis     Family History:  Family History   Problem Relation Age of Onset   ??? Colon cancer Father    ??? Cancer Father    ??? Brain cancer Paternal Uncle    ??? Cancer Paternal Uncle      Physical Examination:  Vitals:   Vitals:    07/13/17 1554   BP: 107/55   Pulse: 81   Resp: 16   Temp: 36.8 ??C (98.2 ??F)   SpO2: 99%     KPS: 80 normal activity with effort; some signs or symptoms of disease  General: Alert, calm, cooperative in no acute distress, accompanied by son  Head: Normocephalic, atraumatic. Well-approximated craniotomy scar without erythema, edema, dehiscence, or discharge.  EENT: No conjunctival injection or scleral icterus. Oral mucosa moist without lesions.  Lymphatic: No cervical or supraclavicular LAD  Lungs: Clear to auscultation bilaterally.  Cardiac: Regular rate and rhythm without murmurs, rubs, or gallops.  Abdomen: Active bowel sounds noted. Soft, nontender abdomen.  Skin: Texture, turgor, and pigmentation appear normal. No rashes, cyanosis, or petechiae.  Extremities: No clubbing, cyanosis, edema, or varicosities noted.    Neurological Examination:  Mental Status: Patient alert and oriented x 4 with affect appropriate to the situation. Able to recall 2/3 objects in five minutes. No difficulty in naming.  Speech: Fluent and spontaneous speech. No expressive aphasia. No difficulty with repetition No verbal perseveration No paraphasic errors. No dysarthria. No receptive aphasia.    Cranial Nerves  Vision (II): Visual acuity is grossly without impairment. Visual fields are full.    EOMs (III,IV,VI): Gaze and tracking appear intact with smooth pursuits, no saccades. No ptosis.    Pupils (III): Pupils are equal, round, and reactive to light and accommodation.    Face (V, VII): Facial sensation is intact. Muscles of mastication full and symmetric.  Hearing (VIII): Hearing is grossly without impairment and intact to conversational speech.  Gag/Swallow (IX): Gag reflex testing deferred. Voice without hoarseness.    Palate (X): Elevates symmetrically.    Neck Strength (XI): Sternocleidomastoid and trapezius strength full and symmetric.  Tongue (XII): Midline without fasciculations.    Motor  Motor exam:  normal muscle mass and tone in all extremities, and no pronator drift.    Strength Right Left   Biceps 5/5 5/5   Triceps 5/5 5/5   Deltoids 5/5 5/5   Wrist Extension 5/5 5/5   Wrist Flexion 5/5 5/5   Ileopsoas 5/5 5/5   Hamstrings 5/5 5/5   Tibialis Anterior  5/5 5/5   Gastronemius 5/5 5/5     Reflexes  Reflex Right Left   Patellar 2/2 2/2     Sensory: Intact to light touch.  Coordination: Intact finger to nose and rapid alternating movements of BUEs without dysmetria. Negative Romberg.  Gait: Steady and normal gait; able to perform tandem gait    Laboratory:  Lab Results   Component Value Date    WBC 4.3 (L) 07/13/2017    HGB 11.6 (L) 07/13/2017    HCT 34.6 (L) 07/13/2017    PLT 216 07/13/2017       Lab Results   Component Value Date    NA 143 07/13/2017    K 3.9 07/13/2017    CL 107 07/13/2017    CO2 29.0 07/13/2017    BUN 6 (L) 07/13/2017    CREATININE 0.68 07/13/2017    GLU 96 07/13/2017    CALCIUM 9.0 07/13/2017       Lab Results   Component Value Date    BILITOT 0.2 07/13/2017    PROT 6.2 (L) 07/13/2017    ALBUMIN 3.6 07/13/2017    ALT 36 07/13/2017    AST 18 07/13/2017    ALKPHOS 65 07/13/2017       Lab Results   Component Value Date    INR 0.98 11/04/2016    APTT 26.3 11/04/2016     Imaging:  MRI scans were personally reviewed. 07/13/17 was compared to 05/11/17. The findings include decreased size of irregular lesion in the inferomedial/parasagittal area of the right frontal lobe.  Pathology:   Final Diagnosis   A. LABELED RIGHT FRONTAL BRAIN BIOPSY, FROZEN SECTION:               HYPERCELLULAR BRAIN TISSUE WITH ATYPICAL ASTROCYTES.  ??  B. LABELED RIGHT FRONTAL BRAIN BIOPSY, FROZEN SECTION:               GLIOBLASTOMA (WHO GRADE IV), PER OUTSIDE CONSULTANT.  ??  C. LABELED RIGHT FRONTAL BRAIN BIOPSY, PERMANENT SECTION:               GLIOBLASTOMA (WHO GRADE IV), PER OUTSIDE CONSULTANT.  ??               SEE COMMENT.  ??  P307 ??2, 331 x2   Electronically signed by Lieutenant Diego, MD on 11/13/2016 at 1117   Comment See result below    Histologic slides and tissue block C were submitted to Dr. Frutoso Schatz, a neuropathologist at [facility] for expert consultation. Please refer to his consultation report for additional comments. He states that IGH and  MGMT studies will be performed and the results reported separately.   Clinical History See result below    Pre-op diagnosis:  Right frontal mass   OR Consultation Diagnosis See result below    A. LABELED RIGHT FRONTAL BRAIN BIOPSY, FROZEN SECTION:               HYPERCELLULAR BRAIN TISSUE.  ??  Frozen section report called to Dr. Jonathon Jordan in the OR by Dr. Jennye Moccasin on 11/07/16 at 10:30 AM.  ??  B. LABELED RIGHT FRONTAL BRAIN BIOPSY, FROZEN SECTION:               HYPERCELLULAR BRAIN TISSUE, FAVOR PRIMARY GLIAL NEOPLASM.Marland Kitchen  ??  Frozen section report called to Dr. Jonathon Jordan in the OR by Dr. Jennye Moccasin on 11/07/16 at 11:10 AM.  ??   Gross Description See result below    A. Received fresh for intraoperative consultation frozen section labeled with the patient's name on the container and labeled right frontal brain biopsy are 2 pink???tan soft tissue fragments measuring 0.4 x 0.2 cm and 0.5 x 0.2 cm. A representative piece from the longest segment is submitted for smear preparation. The remaining tissue is submitted in A1, initially for frozen section. 1???2  ??  B. Received fresh for intraoperative consultation frozen section labeled with the patient's name on the container and labeled right frontal brain biopsy consist of 0.5 x 0.4 x 0.2 cm in aggregate of pink-tan soft tissue fragments, representative tissue submitted for smear preparation. The remaining tissue is submitted in B1, initially for frozen section. 1???F  ??  C. Received in formalin labeled with the patient's name on the container and labeled right frontal brain biopsy consists of 1.0 cm in aggregate of pink???tan to red???tan soft tissue fragments, totally submitted in C1. 1???F

## 2017-07-14 MED ORDER — ONDANSETRON HCL 8 MG TABLET
ORAL_TABLET | 3 refills | 0 days | Status: CP
Start: 2017-07-14 — End: ?

## 2017-07-14 MED ORDER — TEMOZOLOMIDE 140 MG CAPSULE
1 refills | 0 days
Start: 2017-07-14 — End: 2018-07-14

## 2017-07-14 MED ORDER — TEMOZOLOMIDE 180 MG CAPSULE
1 refills | 0 days
Start: 2017-07-14 — End: 2018-07-14

## 2017-07-14 MED FILL — TEMOZOLOMIDE/140MG/CAPS: TEMOZOLOMIDE/140MG/CAPS | 5 days supply | Qty: 5 | Fill #0

## 2017-07-14 MED FILL — TEMOZOLOMIDE/180MG/CAPS: TEMOZOLOMIDE/180MG/CAPS | 5 days supply | Qty: 5 | Fill #0

## 2017-07-14 NOTE — Unmapped (Addendum)
Chemotherapy for 5 days.    Then 23 days off.    Come back in 2 months.    Stop aricept.    Take ZOFRAN each night before you go to sleep to help with morning nausea.

## 2017-07-14 NOTE — Unmapped (Signed)
[facility] Pharm[facility] Refill Coordination Note  Specialty Medication(s): Temozolomide 140mg , Temozolomide 180mg       Brandi Morales, DOB: 1966-01-03  Phone: [phone] (home) , Alternate phone contact: N/A  Phone or address changes today?: No  All above HIPAA information was verified with patient.  Shipping Address: [address]  [place] [address]   Insurance changes? No    Completed refill call assessment today to schedule patient's medication shipment from the [facility] [phone]).      Confirmed the medication and dosage are correct and have not changed: Yes, regimen is correct and unchanged.    Confirmed patient started or stopped the following medications in the past month:  No, there are no changes reported at this time.    Are you tolerating your medication?:  Brandi Morales reports tolerating the medication.    ADHERENCE      Did you miss any doses in the past 4 weeks? No missed doses reported.    FINANCIAL/SHIPPING    Delivery Scheduled: Yes, Expected medication delivery date: 07/15/17     Brandi Morales did not have any additional questions at this time.    Delivery address validated in [facility] scheduling system: Yes, address listed in [facility]SI is correct.    We will follow up with patient monthly for standard refill processing and delivery.      Thank you,  [employer]   [facility] Specialty Pharmacist

## 2017-07-20 MED ORDER — NORTRIPTYLINE 25 MG CAPSULE
ORAL_CAPSULE | Freq: Every evening | ORAL | 3 refills | 0 days | Status: CP
Start: 2017-07-20 — End: 2018-07-20

## 2017-08-05 NOTE — Unmapped (Signed)
[facility] Pharm[facility] Refill Coordination Note  Specialty Medication(s): TEMOZOLOMIDE 140MG  AND 180MG   Additional Medications shipped: NO    Brandi Morales, DOB: Aug 29, 1966  Phone: [phone] (home) , Alternate phone contact: N/A  Phone or address changes today?: No  All above HIPAA information was verified with patient.  Shipping Address: [address]  [place] [address]   Insurance changes? No    Completed refill call assessment today to schedule patient's medication shipment from the [facility] [phone]).      Confirmed the medication and dosage are correct and have not changed: Yes, regimen is correct and unchanged.    Confirmed patient started or stopped the following medications in the past month:  No, there are no changes reported at this time.    Are you tolerating your medication?:  Brandi Morales reports tolerating the medication.    ADHERENCE    Did you miss any doses in the past 4 weeks? No missed doses reported.    FINANCIAL/SHIPPING    Delivery Scheduled: Yes, Expected medication delivery date: 08/11/17     Brandi Morales did not have any additional questions at this time.    Delivery address validated in [facility] scheduling system: Yes, address listed in [facility]SI is correct.    We will follow up with patient monthly for standard refill processing and delivery.      Thank you,  Marletta Lor   [facility] Specialty Pharmacist

## 2017-08-09 MED FILL — TEMOZOLOMIDE/180MG/CAPS: TEMOZOLOMIDE/180MG/CAPS | 5 days supply | Qty: 5 | Fill #1

## 2017-08-09 MED FILL — TEMOZOLOMIDE/140MG/CAPS: TEMOZOLOMIDE/140MG/CAPS | 5 days supply | Qty: 5 | Fill #1

## 2017-08-18 ENCOUNTER — Other Ambulatory Visit: Payer: Self-pay | Admitting: Hematology and Oncology

## 2017-08-18 ENCOUNTER — Telehealth: Payer: Self-pay

## 2017-08-18 DIAGNOSIS — C541 Malignant neoplasm of endometrium: Secondary | ICD-10-CM

## 2017-08-18 NOTE — Telephone Encounter (Signed)
Sharon Daily [facility] breast navigator called. Sharon's [phone].   Sonya Henretti at [employer] is pt's Therapist, nutritional. Sonya's  [phone].  Ivin Booty called stating that pt still has a port. No one is using it. Pt now has brain glioblastoma and is receiving oral chemo from neuro at [facility]. Pt is getting forgetful and doing poorly.   Does Dr Alvy Bimler want to have port removed or contact Sonya to address this issue. It would need cathflo if it is kept.

## 2017-08-18 NOTE — Telephone Encounter (Signed)
S/w Ivin Booty Daily and she will contact pt.

## 2017-08-18 NOTE — Telephone Encounter (Signed)
Order for port removal is placed

## 2017-08-27 ENCOUNTER — Other Ambulatory Visit: Payer: Self-pay | Admitting: General Surgery

## 2017-08-28 ENCOUNTER — Other Ambulatory Visit: Payer: Self-pay | Admitting: Radiology

## 2017-08-31 ENCOUNTER — Encounter (HOSPITAL_COMMUNITY): Payer: Self-pay

## 2017-08-31 ENCOUNTER — Ambulatory Visit (HOSPITAL_COMMUNITY)
Admission: RE | Admit: 2017-08-31 | Discharge: 2017-08-31 | Disposition: A | Discharge status: Home / Self Care | Payer: Medicare Other | Source: Ambulatory Visit | Attending: Hematology and Oncology | Admitting: Hematology and Oncology

## 2017-08-31 DIAGNOSIS — Z8542 Personal history of malignant neoplasm of other parts of uterus: Secondary | ICD-10-CM | POA: Diagnosis not present

## 2017-08-31 DIAGNOSIS — Z452 Encounter for adjustment and management of vascular access device: Secondary | ICD-10-CM | POA: Diagnosis not present

## 2017-08-31 DIAGNOSIS — C541 Malignant neoplasm of endometrium: Secondary | ICD-10-CM

## 2017-08-31 DIAGNOSIS — Z853 Personal history of malignant neoplasm of breast: Secondary | ICD-10-CM | POA: Diagnosis not present

## 2017-08-31 DIAGNOSIS — F17210 Nicotine dependence, cigarettes, uncomplicated: Secondary | ICD-10-CM | POA: Diagnosis not present

## 2017-08-31 DIAGNOSIS — Z923 Personal history of irradiation: Secondary | ICD-10-CM | POA: Diagnosis not present

## 2017-08-31 DIAGNOSIS — Z9221 Personal history of antineoplastic chemotherapy: Secondary | ICD-10-CM | POA: Insufficient documentation

## 2017-08-31 DIAGNOSIS — F329 Major depressive disorder, single episode, unspecified: Secondary | ICD-10-CM | POA: Insufficient documentation

## 2017-08-31 DIAGNOSIS — F419 Anxiety disorder, unspecified: Secondary | ICD-10-CM | POA: Diagnosis not present

## 2017-08-31 HISTORY — DX: Unspecified fracture of shaft of humerus, left arm, initial encounter for closed fracture: S42.302A

## 2017-08-31 HISTORY — PX: IR REMOVAL TUN ACCESS W/ PORT W/O FL MOD SED: IMG2290

## 2017-08-31 HISTORY — DX: Personal history of antineoplastic chemotherapy: Z92.21

## 2017-08-31 HISTORY — DX: Personal history of other medical treatment: Z92.89

## 2017-08-31 LAB — PROTIME-INR
INR: 0.99
PROTHROMBIN TIME: 13 s (ref 11.4–15.2)

## 2017-08-31 MED ORDER — LIDOCAINE-EPINEPHRINE (PF) 1 %-1:200000 IJ SOLN
INTRAMUSCULAR | Status: AC | PRN
  Administered 2017-08-31: 20 mL

## 2017-08-31 MED ORDER — FENTANYL CITRATE (PF) 100 MCG/2ML IJ SOLN
INTRAMUSCULAR | Status: AC
  Filled 2017-08-31: qty 2

## 2017-08-31 MED ORDER — MIDAZOLAM HCL 2 MG/2ML IJ SOLN
INTRAMUSCULAR | Status: AC
  Filled 2017-08-31: qty 2

## 2017-08-31 MED ORDER — CEFAZOLIN SODIUM-DEXTROSE 2-4 GM/100ML-% IV SOLN
INTRAVENOUS | Status: AC
  Administered 2017-08-31: 2 g via INTRAVENOUS
  Filled 2017-08-31: qty 100

## 2017-08-31 MED ORDER — LIDOCAINE-EPINEPHRINE (PF) 2 %-1:200000 IJ SOLN
INTRAMUSCULAR | Status: AC
  Filled 2017-08-31: qty 20

## 2017-08-31 MED ORDER — MIDAZOLAM HCL 2 MG/2ML IJ SOLN
INTRAMUSCULAR | Status: AC | PRN
  Administered 2017-08-31 (×2): 1 mg via INTRAVENOUS

## 2017-08-31 MED ORDER — FENTANYL CITRATE (PF) 100 MCG/2ML IJ SOLN
INTRAMUSCULAR | Status: AC | PRN
  Administered 2017-08-31 (×2): 50 ug via INTRAVENOUS

## 2017-08-31 MED ORDER — CEFAZOLIN SODIUM-DEXTROSE 2-4 GM/100ML-% IV SOLN
2.0000 g | INTRAVENOUS | Status: AC
  Administered 2017-08-31: 2 g via INTRAVENOUS

## 2017-08-31 MED ORDER — SODIUM CHLORIDE 0.9 % IV SOLN
INTRAVENOUS | Status: DC
  Administered 2017-08-31: 14:00:00 via INTRAVENOUS

## 2017-08-31 NOTE — H&P (Signed)
Chief Complaint: Patient was seen in consultation today for port removal at the request of [place]  Referring Physician(s): Heath Lark  Supervising Physician: Sandi Mariscal  Patient Status: [facility] - Out-pt  History of Present Illness: Laurie Benson is a 51 y.o. female with hx of endometrial cancer. She has completed therapy and is referred for port removal. Her port was originally placed by Dr. Anselm Pancoast on 01/01/2015 She reports she had no issues with it. PMHx, meds, labs, imaging reviewed. Has been NPO since 0700.  Past Medical History:  Diagnosis Date  . Anxiety   . Breast cancer ([place])   . Cancer ([place]) 2012   bilat br ca  . Complication of anesthesia    heard talking during teeth extraction  . Depression   . Family history of brain cancer   . Family history of colon cancer   . History of blood transfusion   . Lynch syndrome    MSH6 mutation  . Radiation 01/27/15, 01/30/15, 02/06/15, 02/13/15, 02/20/15   proximal vagina 30 gray  . Uterine cancer [facility])     Past Surgical History:  Procedure Laterality Date  . BREAST RECONSTRUCTION  11/20/2011   Procedure: BREAST RECONSTRUCTION;  Surgeon: Theodoro Kos, DO;  Location: [place];  Service: Plastics;  Laterality: Bilateral;  bilateral implant exchange for breast reconstruction and capsulectomy  . BREAST SURGERY  2012   bilat mastectomy-rt snbx  . CESAREAN SECTION     x2  . LEG SURGERY Left 05/2016  . MULTIPLE TOOTH EXTRACTIONS    . PELVIC AND PARA-AORTIC LYMPH NODE DISSECTION  09/15/14  . ROBOTIC ASSISTED LAPAROSCOPIC HYSTERECTOMY AND SALPINGECTOMY  09/15/14   at [facility] by Dr. Rogelio Seen  . TUBAL LIGATION      Allergies: Tetracyclines & related  Medications: Prior to Admission medications   Medication Sig Start Date End Date Taking? Authorizing Provider  ARIPiprazole (ABILIFY) 20 MG tablet Take 20 mg by mouth daily.   Yes [provider]  clonazePAM (KLONOPIN) 0.1 mg/mL SUSP Take by mouth. 11/19/10   Yes [provider]  docusate sodium (COLACE) 100 MG capsule Take 1 capsule (100 mg total) by mouth 2 (two) times daily. 09/01/16  Yes [name], Lennis P, MD  ferrous fumarate (HEMOCYTE - 106 MG FE) 325 (106 FE) MG TABS tablet Take 1 tablet (106 mg of iron total) by mouth daily. Take 1 tablet by mouth daily on empty stomach with orange juice. 07/19/15  Yes [name], Lennis P, MD  FLUoxetine (PROZAC) 20 MG capsule Take 60 mg by mouth. 06/14/16  Yes [provider]  L-Lysine 500 MG CAPS Take 500 mg by mouth daily.   Yes [provider]  Olaparib 150 MG TABS 1 tab PO QHS on Mon, Tues, Weds while on radiation 11/26/16  Yes [provider]  ondansetron (ZOFRAN) 8 MG tablet One tab PO 30 minutes prior to chemo and q8h PRN nausea 11/26/16  Yes [provider]  QUEtiapine (SEROQUEL) 100 MG tablet Take 100 mg by mouth at bedtime.   Yes [provider]  temozolomide (TEMODAR) 100 MG capsule 1 cap + 1 cap x 20 mg (total dose 120 mg) PO qd 30 min after zofran 1 hr prior to RT during the wk, @ QHS on wknd for 42 consecutive days 11/26/16  Yes [provider]  temozolomide (TEMODAR) 20 MG capsule 1 cap + 1 cap x 100 mg (total dose 120 mg) PO qd 30 min after zofran 1 hr prior to RT during  the wk, @ QHS on wknd for 42 consecutive days 11/26/16  Yes [provider]  thiamine (VITAMIN B-1) 100 MG tablet Take 100 mg by mouth daily. 08/23/14  Yes [provider]  traMADol (ULTRAM) 50 MG tablet Take 50 mg by mouth every 12 (twelve) hours as needed. 07/23/16  Yes [provider]  vitamin B-12 (CYANOCOBALAMIN) 1000 MCG tablet Take 1 tablet (1,000 mcg total) by mouth daily. 12/08/16  Yes Heath Lark, MD  vitamin C (ASCORBIC ACID) 500 MG tablet Take 500 mg by mouth. 11/26/16 11/26/17 Yes [provider]  acetaminophen (TYLENOL) 650 MG CR tablet Take 650 mg by mouth every 8 (eight) hours as needed for pain.    [provider]    donepezil (ARICEPT) 10 MG tablet Take 10 mg by mouth daily. 05/13/16   [provider]  lidocaine-prilocaine (EMLA) cream Apply 1 application topically once as needed. 02/14/16   [provider]  nicotine (NICOTINE STEP 2) 14 mg/24hr patch Place 1 patch (14 mg total) onto the skin daily. 07/10/16   Gordy Levan, MD     Family History  Problem Relation Age of Onset  . Colon cancer Father 45  . Brain cancer Paternal Uncle 62  . Cancer Paternal Grandmother        cancer of her "eyes"    Social History   Social History  . Marital status: Single    Spouse name: N/A  . Number of children: 6  . Years of education: N/A   Social History Main Topics  . Smoking status: Heavy Tobacco Smoker    Packs/day: 1.50    Years: 7.00    Types: Cigarettes  . Smokeless tobacco: Never Used  . Alcohol use Yes     Comment: occ  . Drug use: No  . Sexual activity: Not Currently   Other Topics Concern  . None   Social History Narrative  . None     Review of Systems: A 12 point ROS discussed and pertinent positives are indicated in the HPI above.  All other systems are negative.  Review of Systems  Vital Signs: LMP 09/04/2014   Physical Exam  Constitutional: She is oriented to person, place, and time. She appears well-developed. No distress.  HENT:  Head: Normocephalic.  Mouth/Throat: Oropharynx is clear and moist.  Neck: Normal range of motion. No JVD present. No tracheal deviation present.  Cardiovascular: Normal rate, regular rhythm and normal heart sounds.   Pulmonary/Chest: Effort normal and breath sounds normal. No respiratory distress.  Neurological: She is alert and oriented to person, place, and time.  Skin: Skin is warm and dry.  (R)chest port palpable, site well healed   Psychiatric: She has a normal mood and affect.    Imaging: No results found.  Labs:  CBC:  Recent Labs  12/22/16 0924  WBC 5.5  HGB 11.4*  HCT 34.9  PLT 209    COAGS: No  results for input(s): INR, APTT in the last 8760 hours.  BMP:  Recent Labs  12/22/16 0924  NA 143  K 3.7  CO2 23  GLUCOSE 104  BUN 11.2  CALCIUM 9.6  CREATININE 0.8    LIVER FUNCTION TESTS:  Recent Labs  12/22/16 0924  BILITOT 0.22  AST 12  ALT 8  ALKPHOS 74  PROT 6.5  ALBUMIN 3.7    TUMOR MARKERS: No results for input(s): AFPTM, CEA, CA199, CHROMGRNA in the last 8760 hours.  Assessment and Plan: Hx of endometrial cancer  For port removal. Labs pending Risks and benefits discussed with the patient including, but not limited to bleeding, infection. All of the patient's questions were answered, patient is agreeable to proceed. Consent signed and in chart.    Thank you for this interesting consult.  I greatly enjoyed meeting Aarilyn A Amaral and look forward to participating in their care.  A copy of this report was sent to the requesting provider on this date.  Electronically Signed: Ascencion Dike, PA-C 08/31/2017, 12:41 PM   I spent a total of 20 minutes in face to face in clinical consultation, greater than 50% of which was counseling/coordinating care for port removal.

## 2017-08-31 NOTE — Discharge Instructions (Signed)
Moderate Conscious Sedation, Adult, Care After °These instructions provide you with information about caring for yourself after your procedure. Your health care provider may also give you more specific instructions. Your treatment has been planned according to current medical practices, but problems sometimes occur. Call your health care provider if you have any problems or questions after your procedure. °What can I expect after the procedure? °After your procedure, it is common: °· To feel sleepy for several hours. °· To feel clumsy and have poor balance for several hours. °· To have poor judgment for several hours. °· To vomit if you eat too soon. ° °Follow these instructions at home: °For at least 24 hours after the procedure: ° °· Do not: °? Participate in activities where you could fall or become injured. °? Drive. °? Use heavy machinery. °? Drink alcohol. °? Take sleeping pills or medicines that cause drowsiness. °? Make important decisions or sign legal documents. °? Take care of children on your own. °· Rest. °Eating and drinking °· Follow the diet recommended by your health care provider. °· If you vomit: °? Drink water, juice, or soup when you can drink without vomiting. °? Make sure you have little or no nausea before eating solid foods. °General instructions °· Have a responsible adult stay with you until you are awake and alert. °· Take over-the-counter and prescription medicines only as told by your health care provider. °· If you smoke, do not smoke without supervision. °· Keep all follow-up visits as told by your health care provider. This is important. °Contact a health care provider if: °· You keep feeling nauseous or you keep vomiting. °· You feel light-headed. °· You develop a rash. °· You have a fever. °Get help right away if: °· You have trouble breathing. °This information is not intended to replace advice given to you by your health care provider. Make sure you discuss any questions you have  with your health care provider. °Document Released: 08/10/2013 Document Revised: 03/24/2016 Document Reviewed: 02/09/2016 °Elsevier Interactive Patient Education © 2018 [employer] °Incision Care, Adult °An incision is a cut that a doctor makes in your skin for surgery (for a procedure). Most times, these cuts are closed after surgery. Your cut from surgery may be closed with stitches (sutures), staples, skin glue, or skin tape (adhesive strips). You may need to return to your doctor to have stitches or staples taken out. This may happen many days or many weeks after your surgery. The cut needs to be well cared for so it does not get infected. °How to care for your cut °Cut care °· Follow instructions from your doctor about how to take care of your cut. Make sure you: °? Wash your hands with soap and water before you change your bandage (dressing). If you cannot use soap and water, use hand sanitizer. °? Change your bandage as told by your doctor. °? Leave stitches, skin glue, or skin tape in place. They may need to stay in place for 2 weeks or longer. If tape strips get loose and curl up, you may trim the loose edges. Do not remove tape strips completely unless your doctor says it is okay. °· Check your cut area every day for signs of infection. Check for: °? More redness, swelling, or pain. °? More fluid or blood. °? Warmth. °? Pus or a bad smell. °· Ask your doctor how to clean the cut. This may include: °? Using mild soap and water. °? Using a clean   towel to pat the cut dry after you clean it. °? Putting a cream or ointment on the cut. Do this only as told by your doctor. °? Covering the cut with a clean bandage. °· Ask your doctor when you can leave the cut uncovered. °· Do not take baths, swim, or use a hot tub until your doctor says it is okay. Ask your doctor if you can take showers. You may only be allowed to take sponge baths for bathing. °Medicines °· If you were prescribed an antibiotic medicine,  cream, or ointment, take the antibiotic or put it on the cut as told by your doctor. Do not stop taking or putting on the antibiotic even if your condition gets better. °· Take over-the-counter and prescription medicines only as told by your doctor. °General instructions °· Limit movement around your cut. This helps healing. °? Avoid straining, lifting, or exercise for the first month, or for as long as told by your doctor. °? Follow instructions from your doctor about going back to your normal activities. °? Ask your doctor what activities are safe. °· Protect your cut from the sun when you are outside for the first 6 months, or for as long as told by your doctor. Put on sunscreen around the scar or cover up the scar. °· Keep all follow-up visits as told by your doctor. This is important. °Contact a doctor if: °· Your have more redness, swelling, or pain around the cut. °· You have more fluid or blood coming from the cut. °· Your cut feels warm to the touch. °· You have pus or a bad smell coming from the cut. °· You have a fever or shaking chills. °· You feel sick to your stomach (nauseous) or you throw up (vomit). °· You are dizzy. °· Your stitches or staples come undone. °Get help right away if: °· You have a red streak coming from your cut. °· Your cut bleeds through the bandage and the bleeding does not stop with gentle pressure. °· The edges of your cut open up and separate. °· You have very bad (severe) pain. °· You have a rash. °· You are confused. °· You pass out (faint). °· You have trouble breathing and you have a fast heartbeat. °This information is not intended to replace advice given to you by your health care provider. Make sure you discuss any questions you have with your health care provider. °Document Released: 01/12/2012 Document Revised: 06/27/2016 Document Reviewed: 06/27/2016 °Elsevier Interactive Patient Education © 2017 Else[employer] Inc[employer] ° °

## 2017-08-31 NOTE — Procedures (Signed)
Pre Procedural Dx: Poor venous access Post Procedural Dx: Same  Successful removal of anterior chest wall port-a-cath.  EBL: Minimal  No immediate post procedural complications.   [name], MD Pager #: [phone]   

## 2017-09-03 ENCOUNTER — Telehealth: Payer: Self-pay | Admitting: *Deleted

## 2017-09-03 NOTE — Telephone Encounter (Signed)
"  I received a call about an appointment Monday but I missed the time."   Spoke with patient and son about 09-07-2017 GYN/ONC appointment with Dr.Rossi at 2:30 pm. Instructed to arrive at 2:00 pm for registration process.  Denies further questions or needs at this time.

## 2017-09-04 DIAGNOSIS — R55 Syncope and collapse: Secondary | ICD-10-CM | POA: Insufficient documentation

## 2017-09-07 ENCOUNTER — Ambulatory Visit: Payer: Medicare Other | Admitting: Gynecologic Oncology

## 2017-09-07 ENCOUNTER — Telehealth: Payer: Self-pay | Admitting: *Deleted

## 2017-09-07 NOTE — Telephone Encounter (Signed)
"  When is my appointment?  Who do I see?  What type of doctor is this?"  Appointment information provided.  Asked to arrive at  2:00 for GYN/ONC registation.  Confirms her son will bring her today.  Lives in [place] with a [address] [place] drive.

## 2017-09-07 NOTE — Telephone Encounter (Signed)
Returned patient's call and moved her appt from today to November 26th at [place] at 10:45am

## 2017-09-28 ENCOUNTER — Ambulatory Visit: Payer: Medicare Other | Attending: Gynecologic Oncology | Admitting: Gynecologic Oncology

## 2017-09-28 ENCOUNTER — Telehealth: Payer: Self-pay | Admitting: *Deleted

## 2017-09-28 ENCOUNTER — Encounter: Payer: Self-pay | Admitting: Gynecologic Oncology

## 2017-09-28 VITALS — BP 108/66 | HR 91 | Temp 98.3°F | Resp 20 | Ht 63.0 in | Wt 136.3 lb

## 2017-09-28 DIAGNOSIS — C719 Malignant neoplasm of brain, unspecified: Secondary | ICD-10-CM | POA: Insufficient documentation

## 2017-09-28 DIAGNOSIS — Z79899 Other long term (current) drug therapy: Secondary | ICD-10-CM | POA: Insufficient documentation

## 2017-09-28 DIAGNOSIS — Z9851 Tubal ligation status: Secondary | ICD-10-CM | POA: Insufficient documentation

## 2017-09-28 DIAGNOSIS — Z79891 Long term (current) use of opiate analgesic: Secondary | ICD-10-CM | POA: Diagnosis not present

## 2017-09-28 DIAGNOSIS — Z923 Personal history of irradiation: Secondary | ICD-10-CM | POA: Insufficient documentation

## 2017-09-28 DIAGNOSIS — Z800 Family history of malignant neoplasm of digestive organs: Secondary | ICD-10-CM | POA: Insufficient documentation

## 2017-09-28 DIAGNOSIS — Z1501 Genetic susceptibility to malignant neoplasm of breast: Secondary | ICD-10-CM | POA: Diagnosis not present

## 2017-09-28 DIAGNOSIS — Z853 Personal history of malignant neoplasm of breast: Secondary | ICD-10-CM | POA: Insufficient documentation

## 2017-09-28 DIAGNOSIS — Z8542 Personal history of malignant neoplasm of other parts of uterus: Secondary | ICD-10-CM | POA: Diagnosis not present

## 2017-09-28 DIAGNOSIS — Z9221 Personal history of antineoplastic chemotherapy: Secondary | ICD-10-CM | POA: Diagnosis not present

## 2017-09-28 DIAGNOSIS — Z1509 Genetic susceptibility to other malignant neoplasm: Secondary | ICD-10-CM

## 2017-09-28 DIAGNOSIS — Z90710 Acquired absence of both cervix and uterus: Secondary | ICD-10-CM | POA: Insufficient documentation

## 2017-09-28 DIAGNOSIS — Z9889 Other specified postprocedural states: Secondary | ICD-10-CM | POA: Diagnosis not present

## 2017-09-28 DIAGNOSIS — Z85841 Personal history of malignant neoplasm of brain: Secondary | ICD-10-CM

## 2017-09-28 DIAGNOSIS — Z1502 Genetic susceptibility to malignant neoplasm of ovary: Secondary | ICD-10-CM

## 2017-09-28 DIAGNOSIS — F17210 Nicotine dependence, cigarettes, uncomplicated: Secondary | ICD-10-CM | POA: Insufficient documentation

## 2017-09-28 DIAGNOSIS — C541 Malignant neoplasm of endometrium: Secondary | ICD-10-CM

## 2017-09-28 NOTE — Telephone Encounter (Signed)
"  I received a call for an appointment today at 11:00.  Is this Dr. Mariana Kaufman office?  Is this about the cancer in my uterus?"  Appointment today is with Dr. Denman George.  Please arrive at 10:30 am to register.  Dr. Marko Plume retired last year. Denies further needs or questions at this time.Marland Kitchen

## 2017-09-28 NOTE — Patient Instructions (Signed)
Please follow-up with Dr Denman George in April/May 2019 for uterine cancer check up if you are not receiving treatment for your brain cancer.  Ask Dr Eugenia Pancoast if you tested positive for BRCA in addition to Lynch syndrome.
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
## 2017-10-13 ENCOUNTER — Ambulatory Visit
Admission: RE | Admit: 2017-10-13 | Discharge: 2017-10-13 | Disposition: A | Discharge status: Home / Self Care | Payer: MEDICARE

## 2017-10-13 ENCOUNTER — Ambulatory Visit: Admit: 2017-10-13 | Discharge: 2017-10-13 | Payer: MEDICARE

## 2017-10-13 ENCOUNTER — Ambulatory Visit
Admission: RE | Admit: 2017-10-13 | Discharge: 2017-10-13 | Disposition: A | Discharge status: Home / Self Care | Payer: MEDICARE | Attending: Student in an Organized Health Care Education/Training Program | Admitting: Student in an Organized Health Care Education/Training Program

## 2017-10-13 DIAGNOSIS — C719 Malignant neoplasm of brain, unspecified: Principal | ICD-10-CM

## 2017-10-13 DIAGNOSIS — G9389 Other specified disorders of brain: Secondary | ICD-10-CM

## 2017-10-13 LAB — COMPREHENSIVE METABOLIC PANEL
ALBUMIN: 3.8 g/dL (ref 3.5–5.0)
ALKALINE PHOSPHATASE: 85 U/L (ref 38–126)
ANION GAP: 7 mmol/L — ABNORMAL LOW (ref 9–15)
AST (SGOT): 20 U/L (ref 14–38)
BILIRUBIN TOTAL: 0.3 mg/dL (ref 0.0–1.2)
BLOOD UREA NITROGEN: 8 mg/dL (ref 7–21)
BUN / CREAT RATIO: 9
CALCIUM: 9.2 mg/dL (ref 8.5–10.2)
CHLORIDE: 108 mmol/L — ABNORMAL HIGH (ref 98–107)
CO2: 28 mmol/L (ref 22.0–30.0)
CREATININE: 0.87 mg/dL (ref 0.60–1.00)
EGFR MDRD AF AMER: >=60 mL/min/{1.73_m2} (ref >=60)
EGFR MDRD NON AF AMER: >=60 mL/min/{1.73_m2} (ref >=60)
GLUCOSE RANDOM: 92 mg/dL (ref 65–179)
POTASSIUM: 4.1 mmol/L (ref 3.5–5.0)
PROTEIN TOTAL: 6.1 g/dL — ABNORMAL LOW (ref 6.5–8.3)
SODIUM: 143 mmol/L (ref 135–145)

## 2017-10-13 LAB — ALT (SGPT): Alanine aminotransferase:CCnc:Pt:Ser/Plas:Qn:: 40

## 2017-10-13 LAB — CBC W/ AUTO DIFF
HEMATOCRIT: 37.5 % (ref 36.0–46.0)
HEMOGLOBIN: 12.8 g/dL (ref 12.0–16.0)
LARGE UNSTAINED CELLS: 3 % (ref 0–4)
LYMPHOCYTES ABSOLUTE COUNT: 1.5 10*9/L (ref 1.5–5.0)
MEAN CORPUSCULAR HEMOGLOBIN CONC: 34.1 g/dL (ref 31.0–37.0)
MEAN CORPUSCULAR HEMOGLOBIN: 32.7 pg (ref 26.0–34.0)
MEAN PLATELET VOLUME: 7.7 fL (ref 7.0–10.0)
MONOCYTES ABSOLUTE COUNT: 0.3 10*9/L (ref 0.2–0.8)
NEUTROPHILS ABSOLUTE COUNT: 3.3 10*9/L (ref 2.0–7.5)
PLATELET COUNT: 206 10*9/L (ref 150–440)
RED BLOOD CELL COUNT: 3.91 10*12/L — ABNORMAL LOW (ref 4.00–5.20)
RED CELL DISTRIBUTION WIDTH: 13.6 % (ref 12.0–15.0)
WBC ADJUSTED: 5.5 10*9/L (ref 4.5–11.0)

## 2017-10-13 LAB — WBC ADJUSTED: Lab: 5.5

## 2017-10-13 MED ORDER — LEVETIRACETAM 500 MG TABLET
ORAL_TABLET | Freq: Two times a day (BID) | ORAL | 3 refills | 0 days | Status: CP
Start: 2017-10-13 — End: 2018-10-18

## 2017-10-13 NOTE — Unmapped (Signed)
Labs drawn and sent for analysis.  Care provided by  Docia Furl, RN

## 2017-10-16 ENCOUNTER — Ambulatory Visit
Admission: RE | Admit: 2017-10-16 | Discharge: 2017-10-16 | Disposition: A | Discharge status: Home / Self Care | Payer: MEDICARE | Attending: Student in an Organized Health Care Education/Training Program | Admitting: Student in an Organized Health Care Education/Training Program

## 2017-10-16 DIAGNOSIS — C719 Malignant neoplasm of brain, unspecified: Principal | ICD-10-CM

## 2017-11-09 MED ORDER — VITAMIN C 500 MG TABLET
ORAL_TABLET | Freq: Every day | ORAL | 3 refills | 0.00000 days | Status: CP
Start: 2017-11-09 — End: 2018-11-09

## 2017-11-18 MED ORDER — FERROUS SULFATE 325 MG (65 MG IRON) TABLET
ORAL_TABLET | Freq: Three times a day (TID) | ORAL | 3 refills | 0.00000 days | Status: CP
Start: 2017-11-18 — End: 2018-11-18

## 2018-03-05 ENCOUNTER — Inpatient Hospital Stay: Payer: Medicare Other | Attending: Gynecologic Oncology | Admitting: Gynecologic Oncology

## 2018-03-15 DIAGNOSIS — K121 Other forms of stomatitis: Secondary | ICD-10-CM | POA: Insufficient documentation

## 2018-04-13 ENCOUNTER — Encounter: Admit: 2018-04-13 | Discharge: 2018-04-14 | Payer: MEDICARE

## 2018-04-13 ENCOUNTER — Ambulatory Visit
Admit: 2018-04-13 | Discharge: 2018-04-14 | Payer: MEDICARE | Attending: Student in an Organized Health Care Education/Training Program | Primary: Student in an Organized Health Care Education/Training Program

## 2018-04-13 DIAGNOSIS — G43809 Other migraine, not intractable, without status migrainosus: Secondary | ICD-10-CM

## 2018-04-13 DIAGNOSIS — C719 Malignant neoplasm of brain, unspecified: Principal | ICD-10-CM

## 2018-04-13 DIAGNOSIS — Z1509 Genetic susceptibility to other malignant neoplasm: Secondary | ICD-10-CM

## 2018-04-13 DIAGNOSIS — Z1501 Genetic susceptibility to malignant neoplasm of breast: Secondary | ICD-10-CM

## 2018-07-13 ENCOUNTER — Encounter: Admit: 2018-07-13 | Discharge: 2018-07-13 | Payer: MEDICARE

## 2018-07-13 ENCOUNTER — Encounter
Admit: 2018-07-13 | Discharge: 2018-07-13 | Payer: MEDICARE | Attending: Student in an Organized Health Care Education/Training Program | Primary: Student in an Organized Health Care Education/Training Program

## 2018-07-13 ENCOUNTER — Ambulatory Visit
Admit: 2018-07-13 | Discharge: 2018-07-13 | Payer: MEDICARE | Attending: Student in an Organized Health Care Education/Training Program | Primary: Student in an Organized Health Care Education/Training Program

## 2018-07-13 DIAGNOSIS — C541 Malignant neoplasm of endometrium: Secondary | ICD-10-CM

## 2018-07-13 DIAGNOSIS — G43809 Other migraine, not intractable, without status migrainosus: Secondary | ICD-10-CM

## 2018-07-13 DIAGNOSIS — C719 Malignant neoplasm of brain, unspecified: Principal | ICD-10-CM

## 2018-07-13 DIAGNOSIS — Z1509 Genetic susceptibility to other malignant neoplasm: Secondary | ICD-10-CM

## 2018-07-13 DIAGNOSIS — Z1501 Genetic susceptibility to malignant neoplasm of breast: Secondary | ICD-10-CM

## 2018-10-05 ENCOUNTER — Encounter: Admit: 2018-10-05 | Discharge: 2018-10-05 | Payer: MEDICARE

## 2018-10-05 ENCOUNTER — Encounter
Admit: 2018-10-05 | Discharge: 2018-10-05 | Payer: MEDICARE | Attending: Student in an Organized Health Care Education/Training Program | Primary: Student in an Organized Health Care Education/Training Program

## 2018-10-05 DIAGNOSIS — C719 Malignant neoplasm of brain, unspecified: Principal | ICD-10-CM

## 2018-10-18 MED ORDER — LEVETIRACETAM 500 MG TABLET
ORAL_TABLET | Freq: Two times a day (BID) | ORAL | 3 refills | 0 days | Status: CP
Start: 2018-10-18 — End: 2019-10-18

## 2018-12-13 DIAGNOSIS — Z87891 Personal history of nicotine dependence: Secondary | ICD-10-CM | POA: Insufficient documentation

## 2018-12-23 ENCOUNTER — Telehealth: Payer: Self-pay

## 2018-12-23 NOTE — Telephone Encounter (Signed)
Faxed genetic testing results and counselor notes  from 11-29-14 to [phone].

## 2019-01-04 ENCOUNTER — Encounter: Admit: 2019-01-04 | Discharge: 2019-01-04 | Payer: MEDICARE

## 2019-01-04 ENCOUNTER — Encounter
Admit: 2019-01-04 | Discharge: 2019-01-04 | Payer: MEDICARE | Attending: Student in an Organized Health Care Education/Training Program | Primary: Student in an Organized Health Care Education/Training Program

## 2019-01-04 DIAGNOSIS — Z881 Allergy status to other antibiotic agents status: Principal | ICD-10-CM

## 2019-01-04 DIAGNOSIS — Z90722 Acquired absence of ovaries, bilateral: Principal | ICD-10-CM

## 2019-01-04 DIAGNOSIS — Z808 Family history of malignant neoplasm of other organs or systems: Principal | ICD-10-CM

## 2019-01-04 DIAGNOSIS — C711 Malignant neoplasm of frontal lobe: Principal | ICD-10-CM

## 2019-01-04 DIAGNOSIS — G43909 Migraine, unspecified, not intractable, without status migrainosus: Principal | ICD-10-CM

## 2019-01-04 DIAGNOSIS — Z923 Personal history of irradiation: Principal | ICD-10-CM

## 2019-01-04 DIAGNOSIS — Z90710 Acquired absence of both cervix and uterus: Principal | ICD-10-CM

## 2019-01-04 DIAGNOSIS — Z853 Personal history of malignant neoplasm of breast: Principal | ICD-10-CM

## 2019-01-04 DIAGNOSIS — Z8542 Personal history of malignant neoplasm of other parts of uterus: Principal | ICD-10-CM

## 2019-01-04 DIAGNOSIS — C719 Malignant neoplasm of brain, unspecified: Principal | ICD-10-CM

## 2019-01-04 DIAGNOSIS — F419 Anxiety disorder, unspecified: Principal | ICD-10-CM

## 2019-01-04 DIAGNOSIS — E785 Hyperlipidemia, unspecified: Principal | ICD-10-CM

## 2019-01-04 DIAGNOSIS — F17210 Nicotine dependence, cigarettes, uncomplicated: Principal | ICD-10-CM

## 2019-01-04 DIAGNOSIS — Z9221 Personal history of antineoplastic chemotherapy: Principal | ICD-10-CM

## 2019-01-04 DIAGNOSIS — Z9013 Acquired absence of bilateral breasts and nipples: Principal | ICD-10-CM

## 2019-01-04 DIAGNOSIS — M810 Age-related osteoporosis without current pathological fracture: Principal | ICD-10-CM

## 2019-01-04 DIAGNOSIS — Z79899 Other long term (current) drug therapy: Principal | ICD-10-CM

## 2019-01-04 DIAGNOSIS — F329 Major depressive disorder, single episode, unspecified: Principal | ICD-10-CM

## 2019-02-15 ENCOUNTER — Encounter: Payer: Self-pay | Admitting: Hematology and Oncology

## 2019-05-03 ENCOUNTER — Encounter: Admit: 2019-05-03 | Discharge: 2019-05-03 | Payer: MEDICARE

## 2019-05-03 ENCOUNTER — Encounter
Admit: 2019-05-03 | Discharge: 2019-05-03 | Payer: MEDICARE | Attending: Student in an Organized Health Care Education/Training Program | Primary: Student in an Organized Health Care Education/Training Program

## 2019-05-03 DIAGNOSIS — C719 Malignant neoplasm of brain, unspecified: Principal | ICD-10-CM

## 2019-06-09 DIAGNOSIS — Z7253 High risk bisexual behavior: Secondary | ICD-10-CM | POA: Insufficient documentation

## 2019-06-30 DIAGNOSIS — R87619 Unspecified abnormal cytological findings in specimens from cervix uteri: Secondary | ICD-10-CM | POA: Insufficient documentation

## 2019-09-05 ENCOUNTER — Encounter
Admit: 2019-09-05 | Discharge: 2019-09-06 | Payer: MEDICARE | Attending: Student in an Organized Health Care Education/Training Program | Primary: Student in an Organized Health Care Education/Training Program

## 2019-09-09 ENCOUNTER — Ambulatory Visit: Admit: 2019-09-09 | Discharge: 2019-09-10 | Payer: MEDICARE

## 2019-09-13 ENCOUNTER — Encounter
Admit: 2019-09-13 | Discharge: 2019-09-14 | Payer: MEDICARE | Attending: Student in an Organized Health Care Education/Training Program | Primary: Student in an Organized Health Care Education/Training Program

## 2019-09-26 DIAGNOSIS — K1321 Leukoplakia of oral mucosa, including tongue: Secondary | ICD-10-CM | POA: Insufficient documentation

## 2019-09-26 DIAGNOSIS — R634 Abnormal weight loss: Secondary | ICD-10-CM | POA: Insufficient documentation

## 2019-09-26 DIAGNOSIS — K068 Other specified disorders of gingiva and edentulous alveolar ridge: Secondary | ICD-10-CM | POA: Insufficient documentation

## 2019-10-22 DIAGNOSIS — C719 Malignant neoplasm of brain, unspecified: Principal | ICD-10-CM

## 2019-10-24 MED ORDER — LEVETIRACETAM 500 MG TABLET
ORAL_TABLET | Freq: Two times a day (BID) | ORAL | 3 refills | 90 days | Status: CP
Start: 2019-10-24 — End: 2020-10-23

## 2020-01-04 DIAGNOSIS — Z1211 Encounter for screening for malignant neoplasm of colon: Secondary | ICD-10-CM | POA: Diagnosis not present

## 2020-01-04 DIAGNOSIS — Z86010 Personal history of colonic polyps: Secondary | ICD-10-CM | POA: Diagnosis not present

## 2020-01-04 DIAGNOSIS — Z800 Family history of malignant neoplasm of digestive organs: Secondary | ICD-10-CM | POA: Diagnosis not present

## 2020-01-04 DIAGNOSIS — K5730 Diverticulosis of large intestine without perforation or abscess without bleeding: Secondary | ICD-10-CM | POA: Diagnosis not present

## 2020-01-09 ENCOUNTER — Encounter: Admit: 2020-01-09 | Discharge: 2020-01-09 | Payer: MEDICARE

## 2020-01-09 DIAGNOSIS — C719 Malignant neoplasm of brain, unspecified: Principal | ICD-10-CM

## 2020-01-09 DIAGNOSIS — R5383 Other fatigue: Secondary | ICD-10-CM | POA: Diagnosis not present

## 2020-01-10 ENCOUNTER — Encounter
Admit: 2020-01-10 | Discharge: 2020-01-11 | Payer: MEDICARE | Attending: Student in an Organized Health Care Education/Training Program | Primary: Student in an Organized Health Care Education/Training Program

## 2020-01-10 DIAGNOSIS — R5383 Other fatigue: Secondary | ICD-10-CM | POA: Diagnosis not present

## 2020-01-10 DIAGNOSIS — C719 Malignant neoplasm of brain, unspecified: Secondary | ICD-10-CM | POA: Diagnosis not present

## 2020-01-11 DIAGNOSIS — D509 Iron deficiency anemia, unspecified: Principal | ICD-10-CM

## 2020-01-11 DIAGNOSIS — K635 Polyp of colon: Principal | ICD-10-CM

## 2020-01-17 ENCOUNTER — Encounter
Admit: 2020-01-17 | Discharge: 2020-01-18 | Payer: MEDICARE | Attending: Student in an Organized Health Care Education/Training Program | Primary: Student in an Organized Health Care Education/Training Program

## 2020-01-17 DIAGNOSIS — C719 Malignant neoplasm of brain, unspecified: Principal | ICD-10-CM

## 2020-01-17 DIAGNOSIS — D649 Anemia, unspecified: Principal | ICD-10-CM

## 2020-01-17 DIAGNOSIS — R5383 Other fatigue: Secondary | ICD-10-CM | POA: Diagnosis not present

## 2020-01-17 MED ORDER — FOLIC ACID 1 MG TABLET
ORAL_TABLET | Freq: Every day | ORAL | 3 refills | 100 days | Status: CP
Start: 2020-01-17 — End: 2021-01-16

## 2020-01-18 DIAGNOSIS — D509 Iron deficiency anemia, unspecified: Principal | ICD-10-CM

## 2020-01-20 DIAGNOSIS — C719 Malignant neoplasm of brain, unspecified: Principal | ICD-10-CM

## 2020-01-26 ENCOUNTER — Other Ambulatory Visit: Admit: 2020-01-26 | Discharge: 2020-01-27 | Payer: MEDICARE

## 2020-01-26 ENCOUNTER — Ambulatory Visit: Admit: 2020-01-26 | Discharge: 2020-01-27 | Payer: MEDICARE

## 2020-01-26 DIAGNOSIS — D509 Iron deficiency anemia, unspecified: Principal | ICD-10-CM

## 2020-01-26 DIAGNOSIS — C719 Malignant neoplasm of brain, unspecified: Principal | ICD-10-CM

## 2020-03-06 DIAGNOSIS — M549 Dorsalgia, unspecified: Secondary | ICD-10-CM | POA: Diagnosis not present

## 2020-03-06 DIAGNOSIS — E785 Hyperlipidemia, unspecified: Secondary | ICD-10-CM | POA: Diagnosis not present

## 2020-03-06 DIAGNOSIS — Z79899 Other long term (current) drug therapy: Secondary | ICD-10-CM | POA: Diagnosis not present

## 2020-03-06 DIAGNOSIS — R21 Rash and other nonspecific skin eruption: Secondary | ICD-10-CM | POA: Diagnosis not present

## 2020-03-06 DIAGNOSIS — E539 Vitamin B deficiency, unspecified: Secondary | ICD-10-CM | POA: Diagnosis not present

## 2020-03-06 DIAGNOSIS — S3992XA Unspecified injury of lower back, initial encounter: Secondary | ICD-10-CM | POA: Diagnosis not present

## 2020-03-06 DIAGNOSIS — S299XXA Unspecified injury of thorax, initial encounter: Secondary | ICD-10-CM | POA: Diagnosis not present

## 2020-03-06 DIAGNOSIS — M461 Sacroiliitis, not elsewhere classified: Secondary | ICD-10-CM | POA: Diagnosis not present

## 2020-03-12 DIAGNOSIS — R21 Rash and other nonspecific skin eruption: Secondary | ICD-10-CM | POA: Diagnosis not present

## 2020-03-12 DIAGNOSIS — M25552 Pain in left hip: Secondary | ICD-10-CM | POA: Diagnosis not present

## 2020-03-12 DIAGNOSIS — N941 Unspecified dyspareunia: Secondary | ICD-10-CM | POA: Insufficient documentation

## 2020-03-12 DIAGNOSIS — M461 Sacroiliitis, not elsewhere classified: Secondary | ICD-10-CM | POA: Diagnosis not present

## 2020-03-12 DIAGNOSIS — E785 Hyperlipidemia, unspecified: Secondary | ICD-10-CM | POA: Diagnosis not present

## 2020-03-12 DIAGNOSIS — M25551 Pain in right hip: Secondary | ICD-10-CM | POA: Diagnosis not present

## 2020-03-20 DIAGNOSIS — Z79899 Other long term (current) drug therapy: Secondary | ICD-10-CM | POA: Diagnosis not present

## 2020-03-20 DIAGNOSIS — M25552 Pain in left hip: Secondary | ICD-10-CM | POA: Diagnosis not present

## 2020-03-20 DIAGNOSIS — D513 Other dietary vitamin B12 deficiency anemia: Secondary | ICD-10-CM | POA: Diagnosis not present

## 2020-03-20 DIAGNOSIS — R21 Rash and other nonspecific skin eruption: Secondary | ICD-10-CM | POA: Diagnosis not present

## 2020-03-20 DIAGNOSIS — M25551 Pain in right hip: Secondary | ICD-10-CM | POA: Diagnosis not present

## 2020-03-27 DIAGNOSIS — D513 Other dietary vitamin B12 deficiency anemia: Secondary | ICD-10-CM | POA: Diagnosis not present

## 2020-04-04 DIAGNOSIS — D513 Other dietary vitamin B12 deficiency anemia: Secondary | ICD-10-CM | POA: Diagnosis not present

## 2020-04-11 DIAGNOSIS — D513 Other dietary vitamin B12 deficiency anemia: Secondary | ICD-10-CM | POA: Diagnosis not present

## 2020-04-30 ENCOUNTER — Encounter: Admit: 2020-04-30 | Discharge: 2020-04-30 | Payer: MEDICARE

## 2020-04-30 ENCOUNTER — Ambulatory Visit
Admit: 2020-04-30 | Discharge: 2020-04-30 | Payer: MEDICARE | Attending: Student in an Organized Health Care Education/Training Program | Primary: Student in an Organized Health Care Education/Training Program

## 2020-04-30 DIAGNOSIS — C719 Malignant neoplasm of brain, unspecified: Secondary | ICD-10-CM | POA: Diagnosis not present

## 2020-04-30 DIAGNOSIS — R5383 Other fatigue: Secondary | ICD-10-CM | POA: Diagnosis not present

## 2020-06-11 ENCOUNTER — Other Ambulatory Visit: Admit: 2020-06-11 | Discharge: 2020-06-11 | Payer: MEDICARE

## 2020-06-11 ENCOUNTER — Encounter
Admit: 2020-06-11 | Discharge: 2020-06-11 | Payer: MEDICARE | Attending: Student in an Organized Health Care Education/Training Program | Primary: Student in an Organized Health Care Education/Training Program

## 2020-06-11 DIAGNOSIS — Z1501 Genetic susceptibility to malignant neoplasm of breast: Principal | ICD-10-CM

## 2020-06-11 DIAGNOSIS — D509 Iron deficiency anemia, unspecified: Principal | ICD-10-CM

## 2020-06-11 DIAGNOSIS — R5383 Other fatigue: Principal | ICD-10-CM

## 2020-06-11 DIAGNOSIS — Z1509 Genetic susceptibility to other malignant neoplasm: Principal | ICD-10-CM

## 2020-06-11 DIAGNOSIS — C719 Malignant neoplasm of brain, unspecified: Principal | ICD-10-CM

## 2020-07-08 DIAGNOSIS — C719 Malignant neoplasm of brain, unspecified: Principal | ICD-10-CM

## 2020-09-18 ENCOUNTER — Encounter: Admit: 2020-09-18 | Discharge: 2020-09-19 | Payer: MEDICARE

## 2020-09-18 ENCOUNTER — Ambulatory Visit: Admit: 2020-09-18 | Discharge: 2020-09-19 | Payer: MEDICARE

## 2020-09-18 DIAGNOSIS — F4310 Post-traumatic stress disorder, unspecified: Principal | ICD-10-CM

## 2020-09-18 DIAGNOSIS — Z800 Family history of malignant neoplasm of digestive organs: Principal | ICD-10-CM

## 2020-09-18 DIAGNOSIS — Z8589 Personal history of malignant neoplasm of other organs and systems: Principal | ICD-10-CM

## 2020-09-18 DIAGNOSIS — M1991 Primary osteoarthritis, unspecified site: Principal | ICD-10-CM

## 2020-09-18 DIAGNOSIS — Z881 Allergy status to other antibiotic agents status: Principal | ICD-10-CM

## 2020-09-18 DIAGNOSIS — M545 Low back pain, unspecified: Principal | ICD-10-CM

## 2020-09-18 DIAGNOSIS — Z791 Long term (current) use of non-steroidal anti-inflammatories (NSAID): Principal | ICD-10-CM

## 2020-09-18 DIAGNOSIS — F419 Anxiety disorder, unspecified: Principal | ICD-10-CM

## 2020-09-18 DIAGNOSIS — Z923 Personal history of irradiation: Principal | ICD-10-CM

## 2020-09-18 DIAGNOSIS — Z9221 Personal history of antineoplastic chemotherapy: Principal | ICD-10-CM

## 2020-09-18 DIAGNOSIS — C719 Malignant neoplasm of brain, unspecified: Principal | ICD-10-CM

## 2020-09-18 DIAGNOSIS — Z853 Personal history of malignant neoplasm of breast: Principal | ICD-10-CM

## 2020-09-18 DIAGNOSIS — Z808 Family history of malignant neoplasm of other organs or systems: Principal | ICD-10-CM

## 2020-09-18 DIAGNOSIS — Z634 Disappearance and death of family member: Principal | ICD-10-CM

## 2020-09-18 DIAGNOSIS — F1010 Alcohol abuse, uncomplicated: Principal | ICD-10-CM

## 2020-09-18 DIAGNOSIS — F17210 Nicotine dependence, cigarettes, uncomplicated: Principal | ICD-10-CM

## 2020-09-18 DIAGNOSIS — M810 Age-related osteoporosis without current pathological fracture: Principal | ICD-10-CM

## 2020-09-18 DIAGNOSIS — C711 Malignant neoplasm of frontal lobe: Principal | ICD-10-CM

## 2020-09-18 DIAGNOSIS — R413 Other amnesia: Principal | ICD-10-CM

## 2020-09-18 DIAGNOSIS — Z7982 Long term (current) use of aspirin: Principal | ICD-10-CM

## 2020-09-18 DIAGNOSIS — F4320 Adjustment disorder, unspecified: Principal | ICD-10-CM

## 2020-09-18 DIAGNOSIS — F319 Bipolar disorder, unspecified: Principal | ICD-10-CM

## 2020-09-18 DIAGNOSIS — Z90722 Acquired absence of ovaries, bilateral: Principal | ICD-10-CM

## 2020-09-18 DIAGNOSIS — Z9013 Acquired absence of bilateral breasts and nipples: Principal | ICD-10-CM

## 2020-09-18 DIAGNOSIS — Z90710 Acquired absence of both cervix and uterus: Principal | ICD-10-CM

## 2020-09-18 DIAGNOSIS — Z79899 Other long term (current) drug therapy: Principal | ICD-10-CM

## 2020-09-18 DIAGNOSIS — E785 Hyperlipidemia, unspecified: Principal | ICD-10-CM

## 2020-09-18 DIAGNOSIS — R059 Cough, unspecified: Principal | ICD-10-CM

## 2020-09-18 DIAGNOSIS — Z85841 Personal history of malignant neoplasm of brain: Secondary | ICD-10-CM | POA: Diagnosis not present

## 2020-10-17 DIAGNOSIS — C719 Malignant neoplasm of brain, unspecified: Principal | ICD-10-CM

## 2020-10-17 DIAGNOSIS — M545 Low back pain, unspecified back pain laterality, unspecified chronicity, unspecified whether sciatica present: Principal | ICD-10-CM

## 2020-10-17 DIAGNOSIS — G43809 Other migraine, not intractable, without status migrainosus: Principal | ICD-10-CM

## 2020-10-31 DIAGNOSIS — R21 Rash and other nonspecific skin eruption: Secondary | ICD-10-CM | POA: Diagnosis not present

## 2020-11-06 DIAGNOSIS — D513 Other dietary vitamin B12 deficiency anemia: Secondary | ICD-10-CM | POA: Diagnosis not present

## 2020-11-06 DIAGNOSIS — M545 Low back pain, unspecified: Secondary | ICD-10-CM | POA: Diagnosis not present

## 2020-11-06 DIAGNOSIS — Z23 Encounter for immunization: Secondary | ICD-10-CM | POA: Diagnosis not present

## 2020-11-06 DIAGNOSIS — E539 Vitamin B deficiency, unspecified: Secondary | ICD-10-CM | POA: Insufficient documentation

## 2020-11-06 DIAGNOSIS — R0602 Shortness of breath: Secondary | ICD-10-CM | POA: Diagnosis not present

## 2020-11-13 DIAGNOSIS — R760 Raised antibody titer: Secondary | ICD-10-CM | POA: Insufficient documentation

## 2020-11-13 DIAGNOSIS — D513 Other dietary vitamin B12 deficiency anemia: Secondary | ICD-10-CM | POA: Diagnosis not present

## 2020-11-22 DIAGNOSIS — D513 Other dietary vitamin B12 deficiency anemia: Secondary | ICD-10-CM | POA: Diagnosis not present

## 2020-12-03 DIAGNOSIS — R0600 Dyspnea, unspecified: Secondary | ICD-10-CM | POA: Insufficient documentation

## 2020-12-03 DIAGNOSIS — R0602 Shortness of breath: Secondary | ICD-10-CM | POA: Diagnosis not present

## 2020-12-05 MED ORDER — BUSPIRONE 30 MG TABLET
Freq: Two times a day (BID) | ORAL | 0 days
Start: 2020-12-05 — End: ?

## 2020-12-12 DIAGNOSIS — L299 Pruritus, unspecified: Secondary | ICD-10-CM | POA: Diagnosis not present

## 2020-12-12 DIAGNOSIS — L300 Nummular dermatitis: Secondary | ICD-10-CM | POA: Diagnosis not present

## 2020-12-12 MED ORDER — TRIAMCINOLONE ACETONIDE 0.1 % TOPICAL CREAM
0 days
Start: 2020-12-12 — End: ?

## 2020-12-14 DIAGNOSIS — Z122 Encounter for screening for malignant neoplasm of respiratory organs: Secondary | ICD-10-CM | POA: Diagnosis not present

## 2020-12-14 DIAGNOSIS — F17210 Nicotine dependence, cigarettes, uncomplicated: Secondary | ICD-10-CM | POA: Diagnosis not present

## 2020-12-14 DIAGNOSIS — Z87891 Personal history of nicotine dependence: Secondary | ICD-10-CM | POA: Diagnosis not present

## 2020-12-17 DIAGNOSIS — D513 Other dietary vitamin B12 deficiency anemia: Secondary | ICD-10-CM | POA: Diagnosis not present

## 2020-12-17 DIAGNOSIS — M545 Low back pain, unspecified: Secondary | ICD-10-CM | POA: Diagnosis not present

## 2020-12-17 DIAGNOSIS — Z7189 Other specified counseling: Secondary | ICD-10-CM | POA: Diagnosis not present

## 2020-12-18 ENCOUNTER — Encounter: Admit: 2020-12-18 | Discharge: 2020-12-18 | Payer: MEDICARE

## 2020-12-18 DIAGNOSIS — F319 Bipolar disorder, unspecified: Principal | ICD-10-CM

## 2020-12-18 DIAGNOSIS — Z7982 Long term (current) use of aspirin: Principal | ICD-10-CM

## 2020-12-18 DIAGNOSIS — M810 Age-related osteoporosis without current pathological fracture: Principal | ICD-10-CM

## 2020-12-18 DIAGNOSIS — D649 Anemia, unspecified: Principal | ICD-10-CM

## 2020-12-18 DIAGNOSIS — C719 Malignant neoplasm of brain, unspecified: Principal | ICD-10-CM

## 2020-12-18 DIAGNOSIS — E785 Hyperlipidemia, unspecified: Principal | ICD-10-CM

## 2020-12-18 DIAGNOSIS — Z853 Personal history of malignant neoplasm of breast: Principal | ICD-10-CM

## 2020-12-18 DIAGNOSIS — Z800 Family history of malignant neoplasm of digestive organs: Principal | ICD-10-CM

## 2020-12-18 DIAGNOSIS — F17210 Nicotine dependence, cigarettes, uncomplicated: Principal | ICD-10-CM

## 2020-12-18 DIAGNOSIS — R5383 Other fatigue: Principal | ICD-10-CM

## 2020-12-18 DIAGNOSIS — Z79899 Other long term (current) drug therapy: Principal | ICD-10-CM

## 2020-12-18 DIAGNOSIS — Z9013 Acquired absence of bilateral breasts and nipples: Principal | ICD-10-CM

## 2020-12-18 DIAGNOSIS — F4320 Adjustment disorder, unspecified: Principal | ICD-10-CM

## 2020-12-18 DIAGNOSIS — F4310 Post-traumatic stress disorder, unspecified: Principal | ICD-10-CM

## 2020-12-18 DIAGNOSIS — G8929 Other chronic pain: Principal | ICD-10-CM

## 2020-12-18 DIAGNOSIS — Z923 Personal history of irradiation: Principal | ICD-10-CM

## 2020-12-18 DIAGNOSIS — M545 Low back pain, unspecified: Principal | ICD-10-CM

## 2020-12-18 DIAGNOSIS — F17290 Nicotine dependence, other tobacco product, uncomplicated: Principal | ICD-10-CM

## 2020-12-18 DIAGNOSIS — Z9221 Personal history of antineoplastic chemotherapy: Principal | ICD-10-CM

## 2020-12-18 DIAGNOSIS — H538 Other visual disturbances: Principal | ICD-10-CM

## 2020-12-18 DIAGNOSIS — G43009 Migraine without aura, not intractable, without status migrainosus: Principal | ICD-10-CM

## 2020-12-18 DIAGNOSIS — Z8542 Personal history of malignant neoplasm of other parts of uterus: Principal | ICD-10-CM

## 2020-12-18 DIAGNOSIS — F1014 Alcohol abuse with alcohol-induced mood disorder: Principal | ICD-10-CM

## 2020-12-25 DIAGNOSIS — D513 Other dietary vitamin B12 deficiency anemia: Secondary | ICD-10-CM | POA: Diagnosis not present

## 2020-12-27 DIAGNOSIS — G4733 Obstructive sleep apnea (adult) (pediatric): Secondary | ICD-10-CM | POA: Diagnosis not present

## 2020-12-27 DIAGNOSIS — R5383 Other fatigue: Secondary | ICD-10-CM | POA: Diagnosis not present

## 2020-12-27 DIAGNOSIS — F17210 Nicotine dependence, cigarettes, uncomplicated: Secondary | ICD-10-CM | POA: Diagnosis not present

## 2020-12-27 DIAGNOSIS — J4530 Mild persistent asthma, uncomplicated: Secondary | ICD-10-CM | POA: Diagnosis not present

## 2021-01-02 DIAGNOSIS — M545 Low back pain, unspecified: Secondary | ICD-10-CM | POA: Diagnosis not present

## 2021-01-02 DIAGNOSIS — R202 Paresthesia of skin: Secondary | ICD-10-CM | POA: Insufficient documentation

## 2021-01-02 DIAGNOSIS — M25551 Pain in right hip: Secondary | ICD-10-CM | POA: Diagnosis not present

## 2021-01-15 DIAGNOSIS — L97129 Non-pressure chronic ulcer of left thigh with unspecified severity: Secondary | ICD-10-CM | POA: Diagnosis not present

## 2021-01-15 DIAGNOSIS — D485 Neoplasm of uncertain behavior of skin: Secondary | ICD-10-CM | POA: Diagnosis not present

## 2021-01-15 DIAGNOSIS — L300 Nummular dermatitis: Secondary | ICD-10-CM | POA: Diagnosis not present

## 2021-01-15 DIAGNOSIS — L299 Pruritus, unspecified: Secondary | ICD-10-CM | POA: Diagnosis not present

## 2021-02-01 DIAGNOSIS — R21 Rash and other nonspecific skin eruption: Secondary | ICD-10-CM | POA: Insufficient documentation

## 2021-03-18 ENCOUNTER — Ambulatory Visit: Admit: 2021-03-18 | Payer: MEDICARE

## 2021-04-02 MED ORDER — LEVOCETIRIZINE 5 MG TABLET
0 days
Start: 2021-04-02 — End: ?

## 2021-04-02 MED ORDER — BREO ELLIPTA 100 MCG-25 MCG/DOSE POWDER FOR INHALATION
0 days
Start: 2021-04-02 — End: ?

## 2021-04-04 MED ORDER — ALBUTEROL SULFATE HFA 90 MCG/ACTUATION AEROSOL INHALER
0 days
Start: 2021-04-04 — End: ?

## 2021-05-17 DIAGNOSIS — R5383 Other fatigue: Secondary | ICD-10-CM | POA: Insufficient documentation

## 2021-05-21 MED ORDER — CLOBETASOL 0.05 % TOPICAL OINTMENT
0 days
Start: 2021-05-21 — End: ?

## 2021-05-22 MED ORDER — QUETIAPINE 400 MG TABLET
0 days
Start: 2021-05-22 — End: ?

## 2021-05-28 MED ORDER — GABAPENTIN 300 MG CAPSULE
0 days
Start: 2021-05-28 — End: ?

## 2021-05-28 MED ORDER — CLONAZEPAM 1 MG TABLET
0 days
Start: 2021-05-28 — End: ?

## 2021-06-03 ENCOUNTER — Encounter: Admit: 2021-06-03 | Payer: MEDICARE

## 2021-06-09 ENCOUNTER — Encounter: Admit: 2021-06-09 | Payer: MEDICARE

## 2021-08-15 DIAGNOSIS — R0600 Dyspnea, unspecified: Secondary | ICD-10-CM

## 2021-08-20 ENCOUNTER — Ambulatory Visit: Admit: 2021-08-20 | Payer: MEDICARE

## 2021-11-24 ENCOUNTER — Ambulatory Visit: Admit: 2021-11-24 | Payer: MEDICARE

## 2022-05-30 ENCOUNTER — Encounter: Payer: Self-pay | Admitting: *Deleted

## 2022-05-30 ENCOUNTER — Encounter: Payer: Self-pay | Admitting: Cardiology

## 2022-06-10 ENCOUNTER — Telehealth: Payer: Self-pay | Admitting: Cardiology

## 2022-06-10 ENCOUNTER — Ambulatory Visit (INDEPENDENT_AMBULATORY_CARE_PROVIDER_SITE_OTHER): Payer: 59 | Admitting: Cardiology

## 2022-06-10 ENCOUNTER — Encounter: Payer: Self-pay | Admitting: Cardiology

## 2022-06-10 VITALS — BP 100/60 | HR 96 | Ht 63.0 in | Wt 142.0 lb

## 2022-06-10 DIAGNOSIS — R072 Precordial pain: Secondary | ICD-10-CM

## 2022-06-10 DIAGNOSIS — R0609 Other forms of dyspnea: Secondary | ICD-10-CM

## 2022-06-10 DIAGNOSIS — F17200 Nicotine dependence, unspecified, uncomplicated: Secondary | ICD-10-CM | POA: Diagnosis not present

## 2022-06-10 DIAGNOSIS — Z86000 Personal history of in-situ neoplasm of breast: Secondary | ICD-10-CM

## 2022-06-10 DIAGNOSIS — C541 Malignant neoplasm of endometrium: Secondary | ICD-10-CM

## 2022-06-10 DIAGNOSIS — C719 Malignant neoplasm of brain, unspecified: Secondary | ICD-10-CM

## 2022-06-10 DIAGNOSIS — E785 Hyperlipidemia, unspecified: Secondary | ICD-10-CM

## 2022-06-10 HISTORY — DX: Hyperlipidemia, unspecified: E78.5

## 2022-06-10 MED ORDER — IVABRADINE HCL 5 MG PO TABS: ORAL_TABLET | ORAL | 0 refills | Status: DC

## 2022-06-10 MED ORDER — METOPROLOL TARTRATE 50 MG PO TABS: ORAL_TABLET | ORAL | 0 refills | Status: DC

## 2022-06-10 NOTE — Telephone Encounter (Signed)
Follow Up:    Please call about the directions please for her [place].
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
## 2022-06-10 NOTE — Addendum Note (Signed)
Addended by: Jacobo Forest D on: 06/10/2022 10:35 AM   Modules accepted: Orders

## 2022-06-10 NOTE — Patient Instructions (Signed)
Medication Instructions:  Ivabradine '10mg'$  1 tablet 2 hours before CT Scan Metoprolol '50mg'$  1 tablet 2 hours before CT Scan  *If you need a refill on your cardiac medications before your next appointment, please call your pharmacy*   Lab Work: BMP 1 week prior to CT Scan You can come Monday through Friday 8:30 am to 12:00 pm and 1:15 to 4:30. You do not need to make an appointment as the order has already been placed.    Testing/Procedures:   Your cardiac CT will be scheduled at one of the below locations:   [facility] [address] [place], [place] [address] [phone]   At [facility], please arrive at the [facility] and Children's Entrance (Entrance C2) of [facility] 30 minutes prior to test start time. You can use the FREE valet parking offered at entrance C (encouraged to control the heart rate for the test)  Proceed to the [facility] Radiology Department (first floor) to check-in and test prep.  All radiology patients and guests should use entrance C2 at [facility], accessed from [facility] [address] [address], even though the hospital's physical address listed is [address]      Please follow these instructions carefully (unless otherwise directed):   On the Night Before the Test: Be sure to Drink plenty of water. Do not consume any caffeinated/decaffeinated beverages or chocolate 12 hours prior to your test. Do not take any antihistamines 12 hours prior to your test. If the patient has contrast allergy:   On the Day of the Test: Drink plenty of water until 1 hour prior to the test. Do not eat any food 4 hours prior to the test. You may take your regular medications prior to the test.  Take metoprolol (Lopressor) two hours prior to test. HOLD Furosemide/Hydrochlorothiazide morning of the test. FEMALES- please wear underwire-free bra if available, avoid dresses & tight clothing      After the Test: Drink plenty of  water. After receiving IV contrast, you may experience a mild flushed feeling. This is normal. On occasion, you may experience a mild rash up to 24 hours after the test. This is not dangerous. If this occurs, you can take Benadryl 25 mg and increase your fluid intake. If you experience trouble breathing, this can be serious. If it is severe call 911 IMMEDIATELY. If it is mild, please call our office. If you take any of these medications: Glipizide/Metformin, Avandament, Glucavance, please do not take 48 hours after completing test unless otherwise instructed.  We will call to schedule your test 2-4 weeks out understanding that some insurance companies will need an authorization prior to the service being performed.   For non-scheduling related questions, please contact the cardiac imaging nurse navigator should you have any questions/concerns: Marchia Bond, Cardiac Imaging Nurse Navigator Gordy Clement, Cardiac Imaging Nurse Navigator [facility] Direct Office Dial: [phone]   For scheduling needs, including cancellations and rescheduling, please call [place], [phone].    Follow-Up: At [facility], you and your health needs are our priority.  As part of our continuing mission to provide you with exceptional heart care, we have created designated Provider Care Teams.  These Care Teams include your primary Cardiologist (physician) and Advanced Practice Providers (APPs -  Physician Assistants and Nurse Practitioners) who all work together to provide you with the care you need, when you need it.  We recommend signing up for the patient portal called "MyChart".  Sign up information is provided on this After Visit Summary.  MyChart is used to connect with patients for Virtual Visits (Telemedicine).  Patients are able to view lab/test results, encounter notes, upcoming appointments, etc.  Non-urgent messages can be sent to your provider as well.   To learn more  about what you can do with MyChart, go to [email or web address].    Your next appointment:   2 month(s)  The format for your next appointment:   In Person  Provider:   Jenne Campus, MD   Other Instructions Cardiac CT Angiogram A cardiac CT angiogram is a procedure to look at the heart and the area around the heart. It may be done to help find the cause of chest pains or other symptoms of heart disease. During this procedure, a substance called contrast dye is injected into the blood vessels in the area to be checked. A large X-ray machine, called a CT scanner, then takes detailed pictures of the heart and the surrounding area. The procedure is also sometimes called a coronary CT angiogram, coronary artery scanning, or CTA. A cardiac CT angiogram allows the health care provider to see how well blood is flowing to and from the heart. The health care provider will be able to see if there are any problems, such as: Blockage or narrowing of the coronary arteries in the heart. Fluid around the heart. Signs of weakness or disease in the muscles, valves, and tissues of the heart. Tell a health care provider about: Any allergies you have. This is especially important if you have had a previous allergic reaction to contrast dye. All medicines you are taking, including vitamins, herbs, eye drops, creams, and over-the-counter medicines. Any blood disorders you have. Any surgeries you have had. Any medical conditions you have. Whether you are pregnant or may be pregnant. Any anxiety disorders, chronic pain, or other conditions you have that may increase your stress or prevent you from lying still. What are the risks? Generally, this is a safe procedure. However, problems may occur, including: Bleeding. Infection. Allergic reactions to medicines or dyes. Damage to other structures or organs. Kidney damage from the contrast dye that is used. Increased risk of cancer from radiation exposure.  This risk is low. Talk with your health care provider about: The risks and benefits of testing. How you can receive the lowest dose of radiation. What happens before the procedure? Wear comfortable clothing and remove any jewelry, glasses, dentures, and hearing aids. Follow instructions from your health care provider about eating and drinking. This may include: For 12 hours before the procedure -- avoid caffeine. This includes tea, coffee, soda, energy drinks, and diet pills. Drink plenty of water or other fluids that do not have caffeine in them. Being well hydrated can prevent complications. For 4-6 hours before the procedure -- stop eating and drinking. The contrast dye can cause nausea, but this is less likely if your stomach is empty. Ask your health care provider about changing or stopping your regular medicines. This is especially important if you are taking diabetes medicines, blood thinners, or medicines to treat problems with erections (erectile dysfunction). What happens during the procedure?  Hair on your chest may need to be removed so that small sticky patches called electrodes can be placed on your chest. These will transmit information that helps to monitor your heart during the procedure. An IV will be inserted into one of your veins. You might be given a medicine to control your heart rate during  the procedure. This will help to ensure that good images are obtained. You will be asked to lie on an exam table. This table will slide in and out of the CT machine during the procedure. Contrast dye will be injected into the IV. You might feel warm, or you may get a metallic taste in your mouth. You will be given a medicine called nitroglycerin. This will relax or dilate the arteries in your heart. The table that you are lying on will move into the CT machine tunnel for the scan. The person running the machine will give you instructions while the scans are being done. You may be asked  to: Keep your arms above your head. Hold your breath. Stay very still, even if the table is moving. When the scanning is complete, you will be moved out of the machine. The IV will be removed. The procedure may vary among health care providers and hospitals. What can I expect after the procedure? After your procedure, it is common to have: A metallic taste in your mouth from the contrast dye. A feeling of warmth. A headache from the nitroglycerin. Follow these instructions at home: Take over-the-counter and prescription medicines only as told by your health care provider. If you are told, drink enough fluid to keep your urine pale yellow. This will help to flush the contrast dye out of your body. Most people can return to their normal activities right after the procedure. Ask your health care provider what activities are safe for you. It is up to you to get the results of your procedure. Ask your health care provider, or the department that is doing the procedure, when your results will be ready. Keep all follow-up visits as told by your health care provider. This is important. Contact a health care provider if: You have any symptoms of allergy to the contrast dye. These include: Shortness of breath. Rash or hives. A racing heartbeat. Summary A cardiac CT angiogram is a procedure to look at the heart and the area around the heart. It may be done to help find the cause of chest pains or other symptoms of heart disease. During this procedure, a large X-ray machine, called a CT scanner, takes detailed pictures of the heart and the surrounding area after a contrast dye has been injected into blood vessels in the area. Ask your health care provider about changing or stopping your regular medicines before the procedure. This is especially important if you are taking diabetes medicines, blood thinners, or medicines to treat erectile dysfunction. If you are told, drink enough fluid to keep your  urine pale yellow. This will help to flush the contrast dye out of your body. This information is not intended to replace advice given to you by your health care provider. Make sure you discuss any questions you have with your health care provider. Document Revised: 06/15/2019 Document Reviewed: 06/15/2019 Elsevier Patient Education  2020 [place] THIS TEST Your physician has requested that you have an echocardiogram. Echocardiography is a painless test that uses sound waves to create images of your heart. It provides your doctor with information about the size and shape of your heart and how well your heart's chambers and valves are working. This procedure takes approximately one hour. There are no restrictions for this procedure.   Referral made to Lipid Clinic- They will call for appt  Referral made to Oncology- They will call for appt

## 2022-06-11 ENCOUNTER — Ambulatory Visit (INDEPENDENT_AMBULATORY_CARE_PROVIDER_SITE_OTHER): Payer: 59

## 2022-06-11 DIAGNOSIS — R0609 Other forms of dyspnea: Secondary | ICD-10-CM

## 2022-06-11 LAB — ECHOCARDIOGRAM COMPLETE
Area-P 1/2: 3.21 cm2
S' Lateral: 3.45 cm

## 2022-06-17 ENCOUNTER — Telehealth: Payer: Self-pay

## 2022-06-17 ENCOUNTER — Other Ambulatory Visit: Payer: Self-pay | Admitting: Oncology

## 2022-06-17 ENCOUNTER — Inpatient Hospital Stay: Payer: 59

## 2022-06-17 ENCOUNTER — Encounter: Payer: Self-pay | Admitting: Oncology

## 2022-06-17 ENCOUNTER — Inpatient Hospital Stay: Payer: 59 | Attending: Oncology | Admitting: Oncology

## 2022-06-17 VITALS — BP 111/64 | HR 84 | Temp 98.4°F | Resp 19 | Ht 62.5 in | Wt 138.7 lb

## 2022-06-17 DIAGNOSIS — Z9221 Personal history of antineoplastic chemotherapy: Secondary | ICD-10-CM | POA: Insufficient documentation

## 2022-06-17 DIAGNOSIS — Z9079 Acquired absence of other genital organ(s): Secondary | ICD-10-CM

## 2022-06-17 DIAGNOSIS — Z90722 Acquired absence of ovaries, bilateral: Secondary | ICD-10-CM

## 2022-06-17 DIAGNOSIS — D6481 Anemia due to antineoplastic chemotherapy: Secondary | ICD-10-CM

## 2022-06-17 DIAGNOSIS — D649 Anemia, unspecified: Secondary | ICD-10-CM | POA: Insufficient documentation

## 2022-06-17 DIAGNOSIS — Z86000 Personal history of in-situ neoplasm of breast: Secondary | ICD-10-CM | POA: Insufficient documentation

## 2022-06-17 DIAGNOSIS — Z9013 Acquired absence of bilateral breasts and nipples: Secondary | ICD-10-CM | POA: Diagnosis not present

## 2022-06-17 DIAGNOSIS — R5383 Other fatigue: Secondary | ICD-10-CM

## 2022-06-17 DIAGNOSIS — Z8542 Personal history of malignant neoplasm of other parts of uterus: Secondary | ICD-10-CM | POA: Insufficient documentation

## 2022-06-17 DIAGNOSIS — D0511 Intraductal carcinoma in situ of right breast: Secondary | ICD-10-CM

## 2022-06-17 DIAGNOSIS — D539 Nutritional anemia, unspecified: Secondary | ICD-10-CM

## 2022-06-17 DIAGNOSIS — Z1509 Genetic susceptibility to other malignant neoplasm: Secondary | ICD-10-CM | POA: Diagnosis not present

## 2022-06-17 DIAGNOSIS — C719 Malignant neoplasm of brain, unspecified: Secondary | ICD-10-CM

## 2022-06-17 DIAGNOSIS — Z85841 Personal history of malignant neoplasm of brain: Secondary | ICD-10-CM | POA: Insufficient documentation

## 2022-06-17 DIAGNOSIS — Z923 Personal history of irradiation: Secondary | ICD-10-CM | POA: Diagnosis not present

## 2022-06-17 DIAGNOSIS — Z808 Family history of malignant neoplasm of other organs or systems: Secondary | ICD-10-CM

## 2022-06-17 DIAGNOSIS — F17210 Nicotine dependence, cigarettes, uncomplicated: Secondary | ICD-10-CM | POA: Insufficient documentation

## 2022-06-17 DIAGNOSIS — M25559 Pain in unspecified hip: Secondary | ICD-10-CM

## 2022-06-17 DIAGNOSIS — D510 Vitamin B12 deficiency anemia due to intrinsic factor deficiency: Secondary | ICD-10-CM

## 2022-06-17 DIAGNOSIS — C541 Malignant neoplasm of endometrium: Secondary | ICD-10-CM

## 2022-06-17 DIAGNOSIS — M549 Dorsalgia, unspecified: Secondary | ICD-10-CM

## 2022-06-17 DIAGNOSIS — Z90710 Acquired absence of both cervix and uterus: Secondary | ICD-10-CM

## 2022-06-17 DIAGNOSIS — Z1501 Genetic susceptibility to malignant neoplasm of breast: Secondary | ICD-10-CM

## 2022-06-17 DIAGNOSIS — Z800 Family history of malignant neoplasm of digestive organs: Secondary | ICD-10-CM

## 2022-06-17 LAB — FOLATE: Folate: 10.9 ng/mL (ref >5.9)

## 2022-06-17 LAB — CBC AND DIFFERENTIAL
HCT: 33 — AB (ref 36–46)
Hemoglobin: 11.4 — AB (ref 12.0–16.0)
Neutrophils Absolute: 6.7
Platelets: 256 10*3/uL (ref 150–400)
WBC: 10.3

## 2022-06-17 LAB — HEPATIC FUNCTION PANEL
ALT: 21 U/L (ref 7–35)
AST: 23 (ref 13–35)
Alkaline Phosphatase: 59 (ref 25–125)
Bilirubin, Total: 0.4

## 2022-06-17 LAB — BASIC METABOLIC PANEL
BUN: 7 (ref 4–21)
CO2: 26 — AB (ref 13–22)
Chloride: 109 — AB (ref 99–108)
Creatinine: 1 (ref 0.5–1.1)
Glucose: 108
Potassium: 3.9 mEq/L (ref 3.5–5.1)
Sodium: 139 (ref 137–147)

## 2022-06-17 LAB — IRON AND TIBC
Iron: 57 ug/dL (ref 28–170)
Saturation Ratios: 18 % (ref 10.4–31.8)
TIBC: 319 ug/dL (ref 250–450)
UIBC: 262 ug/dL

## 2022-06-17 LAB — COMPREHENSIVE METABOLIC PANEL
Albumin: 4 (ref 3.5–5.0)
Calcium: 8.7 (ref 8.7–10.7)

## 2022-06-17 LAB — FERRITIN: Ferritin: 30 ng/mL (ref 11–307)

## 2022-06-17 LAB — CBC: RBC: 3.77 — AB (ref 3.87–5.11)

## 2022-06-17 LAB — VITAMIN B12: Vitamin B-12: 225 pg/mL (ref 180–914)

## 2022-06-17 NOTE — Telephone Encounter (Signed)
-----   Message from Park Liter, MD sent at 06/13/2022  9:07 AM EDT ----- Echocardiogram showed preserved left ventricular ejection fraction, overall looks good

## 2022-06-17 NOTE — Progress Notes (Signed)
[facility]  [address] [place],  [place]  [address] [phone]  Clinic Day:  06/17/22  Referring physician: Rhea Bleacher, NP   ASSESSMENT & PLAN:   Glioblastoma multiforme She had a brain biopsy of the right frontal lobe in January of 2018, and testing for IDH was negative. She had radiation and temozolomide chemotherapy along with olaparib. Her serial MRI scans have been stable but the last one was February of 2022. I will order one now  Endometrial carcinoma 2015 Treated with TAH/BSO for FIGO Stage IA and had pelvic and paraaortic lymphadenectomy with negative nodes. She received adjuvant chemotherapy with carboplatin and paclitaxel for 6 cycles and brachytherapy.  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 This was estrogen receptor negative.  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     Orders Placed This Encounter  Procedures   CBC and differential    This external order was created through the Results Console.   CBC    This external order was created through the Results Console.   Basic metabolic panel    This external order was created through the Results Console.   Comprehensive metabolic panel    This external order was created through the Results Console.   Hepatic function panel    This external order was created through the Results Console.   This patient has been through a multitude of problems and malignancies but is quite a survivor and appears to be doing well.  However, I do recommend an MRI of the brain since it has been 1 1/2 years. I will schedule that and call her with the results. I will order some tests to evaluate her anemia.  Otherwise I can see her back in 1 year with CBC and CMP.  I discussed the assessment and treatment plan with the  patient.  The patient was provided an opportunity to ask questions and all were answered.  The patient agreed with the plan and demonstrated an understanding of the instructions.  The patient was advised to call back if the symptoms worsen or if the condition fails to improve as anticipated.  Thank you for the opportunity to participate in the care of your patients.  I provided 58 minutes of face-to-face time during this this encounter and > 50% was spent counseling as documented under my assessment and plan.   Derwood Kaplan, MD [facility] [address] [place] [address] Dept: [phone] Dept Fax: [phone]    I have reviewed this report as typed by the medical scribe, and it is complete and accurate.  Derwood Kaplan, MD   9/9/20231:54 PM  CHIEF COMPLAINT:  CC: Glioblastoma   Current Treatment:     HISTORY OF PRESENT ILLNESS:  The patient is a 56 year old who woman who comes in today to be reestablished at our cancer center. 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 She elected to undergo a bilateral mastectomy in 2012. The patient also underwent reconstructive breast surgery afterwards. In October of 2015, after having profuse vaginal bleeding for numerous months, the patient underwent an endometrial biopsy which showed endometrial carcinoma. In November of 2015, she underwent a TAHBSO with bilateral  pelvic and para-aortic lymphadenectomy. The pathology from this surgery showed 70% grade 3 endometrioid carcinoma and 30% grade 2 serous adenocarcinoma. Although no lymph node metastasis or locally advanced disease was seen, as a significant portion of her cancer was grade 3/high grade disease, she received adjuvant chemotherapy. The patient did undergo 6 cycles of carboplatin/paciltaxel, with the last cycle being completed in April 2016.  Afterwards the patient underwent vaginal brachytherapy. Since her surgery, adjuvant chemotherapy, and brachytherapy for her endometrial carcinoma, the patient did undergo genetic testing, which showed her to be positive for Lynch syndrome. Based upon this, the patient undergoes colonoscopies more frequently for colon cancer surveillance.  Testing on 04/30/15 revealed BRCA2, ERBB2 and PIK3CA mutations. He has a history of left breast nodule which increased during pregnancy and was found to be benign.  Oncology History  History of ductal carcinoma in situ (DCIS) of breast  12/25/2010 Pathology Results   Lymph node, sentinel, biopsy, right #1 ONE BENIGN LYMPH NODE (0/1). 2. Lymph node, sentinel, biopsy, right #2 ONE BENIGN LYMPH NODE (0/1). 3. Breast, simple mastectomy, left COLUMNAR CELL CHANGE WITH ATYPIA AND CALCIFICATIONS. FIBROADENOMA. PSEUDOANGIOMATOUS STROMAL HYPERPLASIA ([place]). NO INVASIVE CARCINOMA. 4. Breast, simple mastectomy, right HIGH GRADE DUCTAL CARCINOMA IN SITU WITH NECROSIS AND CALCIFICATIONS INVOLVING AN AREA 3.5 CM. DUCTAL CARCINOMA IN SITU FOCALLY 0.1 CM FROM THE SUPERIOR-ANTERIOR SOFT TISSUE MARGIN AND 2.2 CM FROM THE DEEP MARGIN.   11/20/2011 Pathology Results   Scar -, left mastectomy BENIGN SKIN WITH FIBROSIS CONSISTENT WITH SCAR. NO EVIDENCE OF MALIGNANCY. 2. Breast, capsule, left lateral BENIGN FIBROADIPOSE TISSUE WITH FIBROSIS AND HISTIOCYTIC INFILTRATE. NO EVIDENCE OF MALIGNANCY. 3. Scar -, right mastectomy BENIGN SKIN WITH FIBROSIS CONSISTENT WITH SCAR. NO EVIDENCE OF MALIGNANCY. 4. Breast, capsule, right lateral BENIGN FIBROADIPOSE TISSUE WITH FIBROSIS AND HISTIOCYTIC INFILTRATE. NO EVIDENCE OF MALIGNANCY.   Endometrial cancer ([place])  07/27/2014 Initial Diagnosis   The patient reports heavy and irregular menses. This prompted Dr Marvel Plan to perform an ultrasound of the pelvis on 07/27/14 which revealed a uterus measuring 8x5x5cm with a 2cm intramural fibroid,  and normal appearing ovaries. The endometrial thickness was 2cm. 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   09/04/2014 Imaging   Inhomogeneous endometrium which may correspond to the reported history of endometrial cancer, but poorly evaluated at CT. No CT evidence for intra-abdominal or pelvic metastatic disease. However, there is an ill-defined hypodense possible posterior segment right hepatic lobe mass, for which abdominal MRI with contrast according to liver mass protocol is recommended for further evaluation. 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%)  (see comment)             Histologic grade:     FIGO grade 3 (endometrioid adenocarcinoma: FIGO grade 2; serous carcinoma FIGO grade 3)  Tumor site:     endometrium, anterior and posterior   Tumor  size (gross):     6.5 x 2.8 x 0.8 cm        Myometrial invasion: Inner half  Depth:     2 mm          Wall thickness:     2.2 cm          Percent: 9% (A12) (see comment)       Serosal involvement:     not identified                  Lower uterine segment involvement:     not identified                  Cervical involvement:     not identified        Adnexal  involvement:     not identified        Other involved sites:     not applicable        Cervical/vaginal margin and distance:          widely negative (>3 cm)            Lymphovascular space invasion:     not identified        Regional lymph nodes (see other specimens):      Total number involved:     0                Total number examined:     11       Additional pathologic findings:      Right ovary:     luteinized cyst, corpus luteum, scant adhesions and endosalpingiosis  Left ovary:     luteinized hemorrhagic cyst, endosalpingiosis and dystrophic calcifications  Right fallopian tube:     Hydrosalpinx; no definite fimbria identified. Reactive epithelium (see comment) Left fallopian tube:     chronic salpingitis and scant adhesions; no fimbria identified Myometrium: adenomyosis and uterine serosal adhesions Cervix: Nabothian cysts and parakeratosis         AJCC Pathologic Stage: pT1a    09/21/2014 Genetic Testing   Her tumor was tested for microsatellite instability. The tumor was MSH-6 negative, MSH-2 positive, PMS-2 positive, and MLH-1 positive. 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    11/01/2014 - 02/15/2015 Chemotherapy   She received 6 cycles of adjuvant carbo/taxol   01/01/2015 Procedure   Placement of a subcutaneous port device. Catheter tip at the superior cavoatrial junction and ready to be used.   01/24/2015 - 02/20/2015 Radiation Therapy   She completed vaginal brachytherapy from 01/24/15 through 4-19/16.    04/16/2015 Imaging   Unremarkable CT evaluation of the abdomen and pelvis. Specifically, no findings to raise concern for metastatic disease at this time   12/24/2015 Imaging   CT head was negative   01/03/2016 Imaging   MRI head negative for metastatic disease   01/07/2016 Tumor Marker   Patient's tumor was tested for the following markers: CA125. Results of the tumor marker test revealed 6.0   08/27/2016 Tumor Marker   Patient's  tumor was tested for the following markers: CA127 Results of the tumor marker test revealed 5.8   Glioblastoma multiforme ([place])  02/28/2016 Imaging   Short-interval Brain MRI revealed stability in the T2 signal abnormality in the right frontal lobe with a new questionable subtle punctate focus of associated enhancement. Follow-up brain MRI in 4-6 months was recommended.        09/26/2016 Imaging   Repeat Brain MRI revealed  that the mass had increased in size to 2.2 cm. She was then referred for brain biopsy (wanted to wait until after the holidays).       11/06/2016 Imaging   MRI brain: Inferior frontal glioblastoma has a stable size and appearance compared to 11/06/2014. 2. Interval changes of biopsy. 3. Interval right maxillary sinusitis and mastoiditis.   11/06/2016 Imaging   MRI brain with contrast showed mildly increased size and enhancement of right frontal mass. 2. No new lesions   11/07/2016 - 11/09/2016 Hospital Admission   OR Procedures: Computer guided right frontal brain mass biopsy (11/07/2016)       11/08/2016 Imaging   Ct head with contrast: Right frontal approach biopsy of right posteromedial frontal lobe mass with a tiny focus of air along the biopsy tract and punctate hemorrhage at the site of biopsy. No significant mass effect, extra-axial collection, or evidence for stroke is identified. 2. Residual enhancing mass at site of biopsy is stable in comparison with prior MRI given differences in technique. No additional abnormal enhancement of the brain.   12/15/2016 -  Radiation Therapy   The patient begun concurrent chemo radiation therapy with Temodar.  She was also prescribed Olaparib by Dr. Eugenia Pancoast      INTERVAL HISTORY:  Solstice is seen in the Oncology clinic for follow up of her Glioblastoma multiforme. She had 2 prior malignancies as outlined above. The glioblastoma was discovered after she had multiple  episodes of enuresis, which were caused by seizures during the  night. She was found to have a tumor of the right frontal lobe and had a brain biopsy on 11/07/16. Pathology revealed Glioblastoma, WHO grade IV, and this was negative for IDH-1. 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 TMB was high at 69 and PDL 1 was low, with a score of 7.She was treated with radiation to 6000 cGy and had 6 cycles of adjuvant temozolomide with concurrent olaparib, completed in December of 2018. She has been monitored regularly since then, and last MRI from 12/18/20 was stable without evidence of disease recurrence. She still had post surgical changes with a firght frontal burr hole with hemosiderin deposition .She is doing well now but complains of weakness as well as aching in the back and hip. She is on B12 injections every 2 weeks and oral B3. She denies fever, chills, night sweats, or other signs of infection. She denies cardiorespiratory and gastrointestinal issues. She  denies pain. Her appetite is good. Her weight is up 7 1/2 pounds in the last 6-7 years. Her boyfriend is here with her today. Labs today reveal a mild anemia with a hemoglobin of 11.4 and MCV of 88. The rest of her CBC and CMP are unremarkable.   SOCIAL HISTORY: Tobacco use: smokes about 4-5 cigarettes a day (every day 1/2 packs a day) for 10 years along with e-cigarettes.She drinks alcohol on occasion. She is disabled. She is divorced and has 6 children, including twins, and 14 grandchildren.   FAMILY HISTORY: Her father had colon cancer Her paternal uncle had brain cancer Her paternal grandmother had cancer of the eye   PAST MEDICAL HISTORY: Hyperlipidemia Osteoporosis Osteoarthritis Anxiety Bipolar disorder and PTSD Impaired memory Breast DCIS on right October 2017 Endometrial cancer October 2015  FIGO stage IA Lynch syndrome  PAST SURGICAL HISTORY Tubal ligation C section Fracture of knee TAH/BSO Bilateral mastectomies with reconstruction Brain biopsy 2018   REVIEW OF SYSTEMS:  Review of  Systems - Oncology  Constitutional: Denies fevers, chills or  abnormal night sweats Eyes: Denies blurriness of vision, double vision or watery eyes Ears, nose, mouth, throat, and face: Denies mucositis or sore throat Respiratory: Denies cough, dyspnea or wheezes Cardiovascular: Denies palpitation, chest discomfort or lower extremity swelling Gastrointestinal:  Denies nausea, heartburn or change in bowel habits Skin: Denies abnormal skin rashes Lymphatics: Denies new lymphadenopathy or easy bruising Neurological:Denies numbness, tingling or new weaknesses She has impaired memory Behavioral/Psych: Mood is stable, no new changes  All other systems were reviewed with the patient and are negative.  VITALS:  Blood pressure 111/64, pulse 84, temperature 98.4 F (36.9 C), temperature source Oral, resp. rate 19, height 5' 2.5" (1.588 m), weight 138 lb 11.2 oz (62.9 kg), last menstrual period 09/04/2014, SpO2 98 %.  Wt Readings from Last 3 Encounters:  06/17/22 138 lb 11.2 oz (62.9 kg)  06/10/22 142 lb (64.4 kg)  04/22/22 133 lb (60.3 kg)    Body mass index is 24.96 kg/m.  Performance status (ECOG): 1 - Symptomatic but completely ambulatory  PHYSICAL EXAM:  Physical Exam GENERAL:alert, no distress and comfortable SKIN: skin color, texture, turgor are normal, no rashes or significant lesions EYES: normal, conjunctiva are pink and non-injected, sclera clear OROPHARYNX:no exudate, no erythema and lips, buccal mucosa, and tongue normal  NECK: supple, thyroid normal size, non-tender, without nodularity LYMPH:  no palpable lymphadenopathy in the cervical, axillary or inguinal LUNGS: clear to auscultation and percussion with normal breathing effort HEART: regular rate & rhythm and no murmurs and no lower extremity edema ABDOMEN:abdomen soft, non-tender and normal bowel sounds Musculoskeletal:no cyanosis of digits and no clubbing  PSYCH: alert & oriented x 3 with fluent speech NEURO: no focal  motor/sensory deficits BREASTS: Bilateral reconstructions are negative.  LABS:      Latest Ref Rng & Units 06/17/2022   12:00 AM 12/22/2016    9:24 AM 07/10/2016   10:03 AM  CBC  WBC  10.3     5.5  6.3   Hemoglobin 12.0 - 16.0 11.4     11.4  11.1   Hematocrit 36 - 46 33     34.9  34.5   Platelets 150 - 400 K/uL 256     209  208      This result is from an external source.      Latest Ref Rng & Units 06/17/2022   12:00 AM 12/22/2016    9:24 AM 07/10/2016   10:03 AM  CMP  Glucose 70 - 140 mg/dl  104  82   BUN 4 - 21 7     11.2  12.3   Creatinine 0.5 - 1.1 1.0     0.8  0.8   Sodium 137 - 147 139     143  146   Potassium 3.5 - 5.1 mEq/L 3.9     3.7  4.3   Chloride 99 - 108 109        CO2 13 - 22 26     23  25    Calcium 8.7 - 10.7 8.7     9.6  9.1   Total Protein 6.4 - 8.3 g/dL  6.5  6.6   Total Bilirubin 0.20 - 1.20 mg/dL  0.22  <0.30   Alkaline Phos 25 - 125 59     74  76   AST 13 - 35 23     12  29    ALT 7 - 35 U/L 21     8  14       This  result is from an external source.     No results found for: "CEA1", "CEA" / No results found for: "CEA1", "CEA" No results found for: "PSA1" No results found for: "CAN199" Lab Results  Component Value Date   CAN125 5.2 12/22/2016    No results found for: "TOTALPROTELP", "ALBUMINELP", "A1GS", "A2GS", "BETS", "BETA2SER", "GAMS", "MSPIKE", "SPEI" Lab Results  Component Value Date   TIBC 319 06/17/2022   TIBC 423 09/17/2015   TIBC 303 02/15/2015   FERRITIN 30 06/17/2022   FERRITIN 28 09/17/2015   IRONPCTSAT 18 06/17/2022   IRONPCTSAT 13 (L) 09/17/2015   IRONPCTSAT 18 (L) 02/15/2015   No results found for: "LDH"   STUDIES:  CT CORONARY MORPH W/CTA COR W/SCORE W/CA W/CM &/OR WO/CM  Addendum Date: 07/04/2022   ADDENDUM REPORT: 07/04/2022 21:11 EXAM: Cardiac/Coronary  CTA TECHNIQUE: The patient was scanned on a [employer]. FINDINGS: A 100 kV prospective scan was triggered in the descending thoracic aorta at 111 HU's. Axial  non-contrast 3 mm slices were carried out through the heart. The data set was analyzed on a dedicated work station and scored using the [place]. Gantry rotation speed was 250 msecs and collimation was .6 mm. No beta blockade and 0.8 mg of sl NTG was given. The 3D data set was reconstructed in 5% intervals of the 67-82 % of the R-R cycle. Diastolic phases were analyzed on a dedicated work station using MPR, MIP and VRT modes. The patient received 80 cc of contrast. Aorta:  Normal size.  Mild calcifications.  No dissection. Aortic Valve:  Trileaflet. Mild calcifications. Coronary Arteries:  Normal coronary origin.  Right dominance. RCA is a large dominant artery that gives rise to PDA and PLA. There is smooth minimal narrowing of 0-25% in the proximal portion of this artery. Left main is a large artery that gives rise to LAD and LCX arteries. Calcified plaque noted in the very proximal portion of this artery extending towards wall of the aorta. Only minimal stenosis of 0-25% LAD is a large vessel that has smooth minimal stenosis of 0-25% in its mid portion. Moderate size D1 has mild stenosis of 25-49% in its proximal portion. LCX is a non-dominant artery that gives rise to one large OM1 branch. In the proximal portion of OM branch calcified plaque is noted with minimal stenosis of 0-25%. Other findings: Normal pulmonary vein drainage into the left atrium. Normal left atrial appendage without a thrombus. Normal size of the pulmonary artery. IMPRESSION: 1. Coronary calcium score of 26.2. This was 10 percentile for age and sex matched control. 2. Normal coronary origin with right dominance. 3. CAD-RADS 2. Mild non-obstructive CAD (25-49%). Consider non-atherosclerotic causes of chest pain. Consider preventive therapy and risk factor modification. Electronically Signed   By: Jenne Campus M.D.   On: 07/04/2022 21:11   Result Date: 07/04/2022 EXAM: OVER-READ INTERPRETATION  CT CHEST The following report is a  limited chest CT over-read performed by radiologist Dr. Zetta Bills of [facility], [place] on 07/03/2022. This over-read does not include interpretation of cardiac or coronary anatomy or pathology. The coronary calcium and coronary CT angiography interpretation by the cardiologist is attached. COMPARISON:  None Available. FINDINGS: Vascular: Please see dedicated report for cardiovascular details. Mediastinum/Nodes: No acute process or signs of adenopathy in visualized portions of the mediastinum. Lungs/Pleura: Lungs are clear and airways are patent to the extent evaluated. Upper Abdomen: Incidental imaging of upper abdominal contents without acute process. Musculoskeletal: No acute bone finding or destructive  bone process. Spinal degenerative changes. Bilateral breast implants. IMPRESSION: No acute or significant extracardiac findings. Electronically Signed: By: Zetta Bills M.D. On: 07/04/2022 10:39     EXAM:12/18/2020 MRI BRAIN  FINDINGS: Readministration post procedural changes status post right frontal burr hole with unchanged associated hemosiderin deposition and extra-axial as well as intraparenchymal frontal lobe T2/FLAIR hyperintensity (series 11, image 17)  Focal area of increased FLAIR signal in the right posterior gryus rectus (series 11, image 12) with subtle linear nodular enhancement along its medical margin (21:96) is unchanged from prior. No new areas of abnormal enhancement are visualized.  Unchanged subcentimeter focus of FLAIR signal intensity in the left parietal lobe and stable periventricular increased FLAIR signal abnormality.   Ventricles are normal in size. There is no midline shift. No evidence of intracranial hemorrhage. No diffusion weighted signal abnormality to suggest acute infarct.  Interval decrease in size of the small right mastoid effusion.  IMPRESSION: Stable MRI of the brain without evidence of disease recurrence.    Allergies:  Allergies  Allergen  Reactions   Demeclocycline Swelling   Bee Venom    Tetracyclines & Related Swelling    Current Medications: Current Outpatient Medications  Medication Sig Dispense Refill   acetaminophen (TYLENOL) 650 MG CR tablet Take 650 mg by mouth every 8 (eight) hours as needed for pain.     busPIRone (BUSPAR) 30 MG tablet Take 30 mg by mouth daily.     Cyanocobalamin (B-12 COMPLIANCE INJECTION) 1000 MCG/ML KIT Inject 1,000 mcg as directed every 30 (thirty) days.     ezetimibe (ZETIA) 10 MG tablet Take 10 mg by mouth daily.     FLUoxetine (PROZAC) 20 MG capsule Take 20 mg by mouth 3 (three) times daily.  2   gabapentin (NEURONTIN) 300 MG capsule Take 600 mg by mouth 3 (three) times daily.     ivabradine (CORLANOR) 5 MG TABS tablet Take 2 tablets (11m) 2 hours prior to CT Scan 1 tablet 0   meloxicam (MOBIC) 7.5 MG tablet Take 7.5 mg by mouth 2 (two) times daily.     metoprolol tartrate (LOPRESSOR) 50 MG tablet Take one tablet 2 hours before cardiac CT for heart greater than 55 1 tablet 0   QUEtiapine (SEROQUEL) 100 MG tablet Take 100 mg by mouth at bedtime.     rosuvastatin (CRESTOR) 40 MG tablet Take 40 mg by mouth daily.     triamcinolone cream (KENALOG) 0.1 % Apply 1 Application topically daily.     Vitamin D, Ergocalciferol, (DRISDOL) 1.25 MG (50000 UNIT) CAPS capsule Take 50,000 Units by mouth every 7 (seven) days.     No current facility-administered medications for this visit.      I,Gabriella Ballesteros,acting as a scribe for CDerwood Kaplan MD.,have documented all relevant documentation on the behalf of CDerwood Kaplan MD,as directed by  CDerwood Kaplan MD while in the presence of CDerwood Kaplan MD.

## 2022-06-17 NOTE — Telephone Encounter (Signed)
Patient notified of results.

## 2022-06-19 ENCOUNTER — Other Ambulatory Visit: Payer: Self-pay | Admitting: Hematology and Oncology

## 2022-06-25 ENCOUNTER — Telehealth: Payer: Self-pay | Admitting: Oncology

## 2022-06-25 NOTE — Telephone Encounter (Signed)
LVM requesting patient to call office back to schedule 1 year follow up and to provider her with Brain MRI appointment information.      MRI Brain w/wo is scheduled for 07/18/22 @ 8:15 am; Check in @ 8 am at the [facility] in [place].

## 2022-07-02 ENCOUNTER — Other Ambulatory Visit (HOSPITAL_COMMUNITY): Payer: Self-pay | Admitting: *Deleted

## 2022-07-02 ENCOUNTER — Telehealth (HOSPITAL_COMMUNITY): Payer: Self-pay | Admitting: *Deleted

## 2022-07-02 MED ORDER — METOPROLOL TARTRATE 50 MG PO TABS: ORAL_TABLET | ORAL | 0 refills | Status: DC

## 2022-07-02 MED ORDER — IVABRADINE HCL 5 MG PO TABS: ORAL_TABLET | ORAL | 0 refills | Status: DC

## 2022-07-02 NOTE — Telephone Encounter (Signed)
Reaching out to patient to offer assistance regarding upcoming cardiac imaging study; pt verbalizes understanding of appt date/time, parking situation and where to check in, pre-test NPO status and medications ordered, and verified current allergies; name and call back number provided for further questions should they arise  Gordy Clement RN Navigator Cardiac Imaging [facility] [phone] office [phone] cell  Patient was prescribed '50mg'$  metoprolol and '10mg'$  ivabradine but states she will pick them up and take medications two hours prior to her cardiac CT Scan. She is aware to arrive at 12:30pm.

## 2022-07-03 ENCOUNTER — Ambulatory Visit (HOSPITAL_COMMUNITY)
Admission: RE | Admit: 2022-07-03 | Discharge: 2022-07-03 | Disposition: A | Discharge status: Home / Self Care | Payer: 59 | Source: Ambulatory Visit | Attending: Cardiology | Admitting: Cardiology

## 2022-07-03 DIAGNOSIS — R072 Precordial pain: Secondary | ICD-10-CM | POA: Insufficient documentation

## 2022-07-03 MED ORDER — IOHEXOL 350 MG/ML SOLN
100.0000 mL | Freq: Once | INTRAVENOUS | Status: AC | PRN
  Administered 2022-07-03: 100 mL via INTRAVENOUS

## 2022-07-03 MED ORDER — NITROGLYCERIN 0.4 MG SL SUBL
0.8000 mg | SUBLINGUAL_TABLET | Freq: Once | SUBLINGUAL | Status: AC
  Administered 2022-07-03: 0.8 mg via SUBLINGUAL

## 2022-07-03 MED ORDER — NITROGLYCERIN 0.4 MG SL SUBL
SUBLINGUAL_TABLET | SUBLINGUAL | Status: AC
  Filled 2022-07-03: qty 2

## 2022-07-10 ENCOUNTER — Telehealth: Payer: Self-pay

## 2022-07-10 NOTE — Telephone Encounter (Signed)
Patient notified of results.

## 2022-07-10 NOTE — Telephone Encounter (Signed)
-----   Message from Park Liter, MD sent at 07/07/2022  8:18 PM EDT ----- Mild nonobstructive coronary disease on coronary CT angio.  Looks good

## 2022-07-12 DIAGNOSIS — D539 Nutritional anemia, unspecified: Secondary | ICD-10-CM

## 2022-07-12 DIAGNOSIS — D519 Vitamin B12 deficiency anemia, unspecified: Secondary | ICD-10-CM | POA: Insufficient documentation

## 2022-07-12 HISTORY — DX: Vitamin B12 deficiency anemia, unspecified: D51.9

## 2022-07-12 HISTORY — DX: Nutritional anemia, unspecified: D53.9

## 2022-07-22 ENCOUNTER — Encounter: Payer: Self-pay | Admitting: Oncology

## 2022-08-13 ENCOUNTER — Other Ambulatory Visit: Payer: Self-pay | Admitting: Oncology

## 2022-08-13 ENCOUNTER — Telehealth: Payer: Self-pay | Admitting: Oncology

## 2022-08-13 DIAGNOSIS — C719 Malignant neoplasm of brain, unspecified: Secondary | ICD-10-CM

## 2022-08-13 NOTE — Telephone Encounter (Signed)
Patient has been scheduled for her follow up. Aware of appt date and time.  Pt was notified that she will be receiving a call sometime in Aug/Sept 2024 with her MRI/Lab Appt information - once authorized.    appts Received: Today Derwood Kaplan, MD sent to Belva Chimes, LPN; Neva Seat; [place], [place] Pt came to office 9/25 aasking about her MRI results.  I met with her and her husband and reviewed that it is clear, just postoperative changes. We sent a copy to [place]   She will need f/u appt in 1 year with labs and MRI brain - Sept., will put in orders(will she need labs day of scan?)

## 2022-08-18 ENCOUNTER — Ambulatory Visit: Payer: 59 | Attending: Cardiology | Admitting: Cardiology

## 2022-08-18 ENCOUNTER — Encounter: Payer: Self-pay | Admitting: Cardiology

## 2022-08-18 VITALS — BP 114/66 | HR 94 | Ht 63.0 in | Wt 137.8 lb

## 2022-08-18 DIAGNOSIS — I2510 Atherosclerotic heart disease of native coronary artery without angina pectoris: Secondary | ICD-10-CM

## 2022-08-18 DIAGNOSIS — E785 Hyperlipidemia, unspecified: Secondary | ICD-10-CM | POA: Diagnosis not present

## 2022-08-18 DIAGNOSIS — C719 Malignant neoplasm of brain, unspecified: Secondary | ICD-10-CM

## 2022-08-18 DIAGNOSIS — Z86000 Personal history of in-situ neoplasm of breast: Secondary | ICD-10-CM

## 2022-08-18 HISTORY — DX: Atherosclerotic heart disease of native coronary artery without angina pectoris: I25.10
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
## 2022-08-18 NOTE — Patient Instructions (Signed)

## 2022-08-18 NOTE — Addendum Note (Signed)
Addended by: Jacobo Forest D on: 08/18/2022 09:50 AM   Modules accepted: Orders

## 2022-08-21 ENCOUNTER — Encounter: Payer: Self-pay | Admitting: Pharmacist

## 2022-08-21 ENCOUNTER — Ambulatory Visit: Payer: 59 | Attending: Cardiology | Admitting: Pharmacist

## 2022-08-21 ENCOUNTER — Telehealth: Payer: Self-pay | Admitting: Pharmacist

## 2022-08-21 DIAGNOSIS — F17209 Nicotine dependence, unspecified, with unspecified nicotine-induced disorders: Secondary | ICD-10-CM | POA: Diagnosis not present

## 2022-08-21 DIAGNOSIS — I2510 Atherosclerotic heart disease of native coronary artery without angina pectoris: Secondary | ICD-10-CM | POA: Diagnosis not present

## 2022-08-21 DIAGNOSIS — E785 Hyperlipidemia, unspecified: Secondary | ICD-10-CM

## 2022-08-21 MED ORDER — EZETIMIBE 10 MG PO TABS: 10.0000 mg | ORAL_TABLET | Freq: Every day | ORAL | 2 refills | Status: AC

## 2022-08-21 MED ORDER — NICOTINE POLACRILEX 4 MG MT LOZG: 4.0000 mg | LOZENGE | OROMUCOSAL | 5 refills | Status: DC | PRN

## 2022-08-21 MED ORDER — ROSUVASTATIN CALCIUM 40 MG PO TABS: 40.0000 mg | ORAL_TABLET | Freq: Every day | ORAL | 3 refills | Status: DC

## 2022-08-21 MED ORDER — NICOTINE 21 MG/24HR TD PT24: 21.0000 mg | MEDICATED_PATCH | Freq: Every day | TRANSDERMAL | 5 refills | Status: DC

## 2022-08-21 NOTE — Telephone Encounter (Signed)
PA for Repatha submitted.  Key: Laurie Benson

## 2022-08-21 NOTE — Patient Instructions (Addendum)
It was nice meeting you today  We would like your LDL (bad cholesterol) to be less than 70  Please continue your rosuvastatin '40mg'$  daily and ezetimibe '10mg'$  daily  We would like to start a new medication called Repatha, which you would inject once every 2 weeks  I will complete the prior authorization for you and let you know when it is approved  Once you start the medication, we will recheck your cholesterol levels in 2-3 months  Please call with any questions  Karren Cobble, PharmD, [place], [place], [place], [place], [place], [address] Phone: [phone], Fax: [phone]
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
## 2022-08-22 MED ORDER — REPATHA SURECLICK 140 MG/ML ~~LOC~~ SOAJ: 1.0000 mL | SUBCUTANEOUS | 3 refills | Status: DC

## 2022-08-22 NOTE — Addendum Note (Signed)
Addended by: Rollen Sox on: 08/22/2022 08:16 AM   Modules accepted: Orders

## 2022-08-22 NOTE — Telephone Encounter (Addendum)
PA approved  through 02/21/23. Patient aware. Also reported her plan would not cover nicotine patches or lozenges. Gave number to [employer].

## 2022-11-10 ENCOUNTER — Ambulatory Visit: Payer: 59 | Admitting: Internal Medicine

## 2022-12-15 ENCOUNTER — Other Ambulatory Visit: Payer: Self-pay | Admitting: Family

## 2022-12-15 DIAGNOSIS — Z1231 Encounter for screening mammogram for malignant neoplasm of breast: Secondary | ICD-10-CM

## 2022-12-22 ENCOUNTER — Ambulatory Visit
Admission: RE | Admit: 2022-12-22 | Discharge: 2022-12-22 | Disposition: A | Discharge status: Home / Self Care | Payer: 59 | Source: Ambulatory Visit | Attending: Family | Admitting: Family

## 2022-12-22 DIAGNOSIS — Z1231 Encounter for screening mammogram for malignant neoplasm of breast: Secondary | ICD-10-CM

## 2022-12-23 ENCOUNTER — Other Ambulatory Visit: Payer: Self-pay | Admitting: Family

## 2022-12-23 DIAGNOSIS — R234 Changes in skin texture: Secondary | ICD-10-CM

## 2023-01-09 ENCOUNTER — Ambulatory Visit
Admission: RE | Admit: 2023-01-09 | Discharge: 2023-01-09 | Disposition: A | Discharge status: Home / Self Care | Payer: 59 | Source: Ambulatory Visit | Attending: Family | Admitting: Family

## 2023-01-09 DIAGNOSIS — R234 Changes in skin texture: Secondary | ICD-10-CM

## 2023-01-27 ENCOUNTER — Telehealth: Payer: Self-pay

## 2023-01-27 NOTE — Telephone Encounter (Signed)
LM informing the patient of approval status

## 2023-01-27 NOTE — Telephone Encounter (Signed)
Received fax that [place] PA already on file through 11/03/23.

## 2023-01-27 NOTE — Telephone Encounter (Signed)
-----   Message from Maralyn Sago, CPhT sent at 01/27/2023 12:36 PM EDT ----- Regarding: RE: Delight Stare,                               After looking into this p.a. It looks like the p/a authorization is good for the following effective dates  1.1.24 to 12.31.24 ----- Message ----- From: Darrel Reach, CMA Sent: 01/27/2023  10:08 AM EDT To: Rx Prior Auth Team Subject: Repatha                                        Prior Auth request form Cover my Meds Key #[id number].  Thanks for your assistance

## 2023-01-27 NOTE — Telephone Encounter (Signed)
Pt is returning call.  

## 2023-02-10 DIAGNOSIS — M79642 Pain in left hand: Secondary | ICD-10-CM | POA: Insufficient documentation

## 2023-02-10 DIAGNOSIS — Z716 Tobacco abuse counseling: Secondary | ICD-10-CM | POA: Insufficient documentation

## 2023-06-30 ENCOUNTER — Telehealth: Payer: Self-pay | Admitting: Oncology

## 2023-06-30 NOTE — Telephone Encounter (Signed)
MRI Brain has been scheduled for 07/20/23; Checking in @ 10:15   Notified pt of date,time and instructions.

## 2023-07-21 ENCOUNTER — Inpatient Hospital Stay: Payer: 59 | Attending: Hematology and Oncology | Admitting: Hematology and Oncology

## 2023-07-21 ENCOUNTER — Inpatient Hospital Stay: Payer: 59

## 2023-07-21 ENCOUNTER — Encounter: Payer: Self-pay | Admitting: Hematology and Oncology

## 2023-07-21 ENCOUNTER — Ambulatory Visit: Payer: 59 | Admitting: Oncology

## 2023-07-21 VITALS — BP 152/70 | HR 71 | Temp 98.6°F | Resp 18 | Ht 63.0 in | Wt 128.1 lb

## 2023-07-21 DIAGNOSIS — D539 Nutritional anemia, unspecified: Secondary | ICD-10-CM

## 2023-07-21 DIAGNOSIS — D519 Vitamin B12 deficiency anemia, unspecified: Secondary | ICD-10-CM | POA: Insufficient documentation

## 2023-07-21 DIAGNOSIS — Z86000 Personal history of in-situ neoplasm of breast: Secondary | ICD-10-CM | POA: Diagnosis present

## 2023-07-21 DIAGNOSIS — D510 Vitamin B12 deficiency anemia due to intrinsic factor deficiency: Secondary | ICD-10-CM

## 2023-07-21 DIAGNOSIS — Z1509 Genetic susceptibility to other malignant neoplasm: Secondary | ICD-10-CM

## 2023-07-21 DIAGNOSIS — C719 Malignant neoplasm of brain, unspecified: Secondary | ICD-10-CM

## 2023-07-21 DIAGNOSIS — Z1501 Genetic susceptibility to malignant neoplasm of breast: Secondary | ICD-10-CM

## 2023-07-21 DIAGNOSIS — C541 Malignant neoplasm of endometrium: Secondary | ICD-10-CM

## 2023-07-21 DIAGNOSIS — Z720 Tobacco use: Secondary | ICD-10-CM

## 2023-07-21 LAB — CBC WITH DIFFERENTIAL (CANCER CENTER ONLY)
Abs Immature Granulocytes: 0.05 10*3/uL (ref 0.00–0.07)
Basophils Absolute: 0 10*3/uL (ref 0.0–0.1)
Basophils Relative: 0 %
Eosinophils Absolute: 0.2 10*3/uL (ref 0.0–0.5)
Eosinophils Relative: 2 %
HCT: 37 % (ref 36.0–46.0)
Hemoglobin: 11.9 g/dL — ABNORMAL LOW (ref 12.0–15.0)
Immature Granulocytes: 1 %
Lymphocytes Relative: 26 %
Lymphs Abs: 2.3 10*3/uL (ref 0.7–4.0)
MCH: 28.6 pg (ref 26.0–34.0)
MCHC: 32.2 g/dL (ref 30.0–36.0)
MCV: 88.9 fL (ref 80.0–100.0)
Monocytes Absolute: 0.4 10*3/uL (ref 0.1–1.0)
Monocytes Relative: 5 %
Neutro Abs: 6.1 10*3/uL (ref 1.7–7.7)
Neutrophils Relative %: 66 %
Platelet Count: 328 10*3/uL (ref 150–400)
RBC: 4.16 MIL/uL (ref 3.87–5.11)
RDW: 15 % (ref 11.5–15.5)
WBC Count: 9.1 10*3/uL (ref 4.0–10.5)
nRBC: 0 % (ref 0.0–0.2)

## 2023-07-21 LAB — FERRITIN: Ferritin: 17 ng/mL (ref 11–307)

## 2023-07-21 LAB — FOLATE: Folate: 15.1 ng/mL (ref >5.9)

## 2023-07-21 LAB — VITAMIN B12: Vitamin B-12: 458 pg/mL (ref 180–914)

## 2023-07-21 MED ORDER — VARENICLINE TARTRATE 0.5 MG PO TABS: ORAL_TABLET | ORAL | 0 refills | Status: AC

## 2023-07-21 MED ORDER — VARENICLINE TARTRATE 1 MG PO TABS: 1.0000 mg | ORAL_TABLET | Freq: Two times a day (BID) | ORAL | 2 refills | Status: DC

## 2023-07-21 NOTE — Progress Notes (Signed)
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 by mouth 2 (two) times daily.     meloxicam (MOBIC) 15 MG tablet Take 15 mg by mouth daily.     QUEtiapine (SEROQUEL) 400 MG tablet Take 400 mg by mouth at bedtime.     varenicline (CHANTIX) 0.5 MG tablet Take 1 tablet (0.5 mg total) by mouth daily for 3 days, THEN 1 tablet (0.5 mg total) 2 (two) times daily for 4 days. 11 tablet 0   varenicline (CHANTIX) 1 MG tablet Take 1 tablet (1 mg total) by mouth 2 (two) times daily. Beginning on day 8 60 tablet 2   busPIRone (BUSPAR) 30 MG tablet Take 30 mg by mouth daily.     Cyanocobalamin (B-12 COMPLIANCE INJECTION) 1000 MCG/ML KIT Inject 1,000 mcg as directed every 30 (thirty) days. (Patient not taking: Reported on 07/21/2023)     escitalopram (LEXAPRO) 20 MG tablet Take 20 mg by mouth at bedtime. (Patient not taking: Reported on 07/21/2023)      Evolocumab (REPATHA SURECLICK) 140 MG/ML SOAJ Inject 140 mg into the skin every 14 (fourteen) days. 6 mL 3   ezetimibe (ZETIA) 10 MG tablet Take 1 tablet (10 mg total) by mouth daily. 90 tablet 2   FLUoxetine (PROZAC) 20 MG capsule Take 20 mg by mouth 3 (three) times daily.  2   nicotine (NICODERM CQ - DOSED IN MG/24 HOURS) 21 mg/24hr patch Place 1 patch (21 mg total) onto the skin daily. (Patient not taking: Reported on 07/21/2023) 28 patch 5   nicotine polacrilex (NICORETTE MINI) 4 MG lozenge Take 1 lozenge (4 mg total) by mouth as needed for smoking cessation. (Patient not taking: Reported on 07/21/2023) 100 tablet 5   QUEtiapine (SEROQUEL) 100 MG tablet Take 100 mg by mouth at bedtime. Patient reports taking  this if she awakens during the night     rosuvastatin (CRESTOR)  40 MG tablet Take 1 tablet (40 mg total) by mouth daily. 90 tablet 3   triamcinolone cream (KENALOG) 0.1 % Apply 1 Application topically daily. (Patient not taking: Reported on 08/18/2022)     Vitamin D, Ergocalciferol, (DRISDOL) 1.25 MG (50000 UNIT) CAPS capsule Take 50,000 Units by mouth every 7 (seven) days. (Patient not taking: Reported on 08/18/2022)     No current facility-administered medications for this visit.

## 2023-07-21 NOTE — Assessment & Plan Note (Signed)
History of stage IA endometrial cancer diagnosed in 2015. She was treated with TAH/BSO with pelvic and paraaortic lymphadenectomy. Lymph nodes were negative for metastasis. She received adjuvant chemotherapy with carboplatin and paclitaxel for 6 cycles, as well as brachytherapy. She remains without evidence of recurrence.

## 2023-07-21 NOTE — Assessment & Plan Note (Signed)
History of B12 deficiency.  She has been taking B12 injections at home but discontinued these on her own.

## 2023-07-21 NOTE — Assessment & Plan Note (Addendum)
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  She was treated with bilateral mastectomy and reconstruction. She remains without evidence of recurrence.

## 2023-07-21 NOTE — Assessment & Plan Note (Signed)
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 Testing for IDH was negative. She was treated radiation and temozolomide chemotherapy along with olaparib.  MRI head in September 2023 revealed resolution of contrast-enhancement at the site of the paramedian inferior left frontal tumor with near resolution of associated hyperintense T2 WI signal, without evidence of new disease.  She no longer follows with neurosurgery at [facility], so we have ordered MRI brain.  She reports an episode of right arm weakness and numbness lasting for 3 days in August, which has completely resolved.  She did not seek medical attention at the time of the incident.  MRI from September 16 is pending.

## 2023-07-22 ENCOUNTER — Encounter: Payer: Self-pay | Admitting: Hematology and Oncology

## 2023-07-22 NOTE — Assessment & Plan Note (Addendum)
The patient reports smoking 1 pack cigarettes a day for the past 12-15 years. Although she did not remember this, she underwent low dose CT chest for lung cancer screening in July ordered by her PCP, which was not concerning for malignancy but did reveal mild diffuse bronchial wall thickening with mild central lobar and paraseptal emphysema, as well as aortic atherosclerosis.. She states she has never tried anything to quit smoking. She is motivated to quit. We discussed trying Chantix and nicotine replacement, in addition to smoking cessation classes. She would like to try the Chantix. We discussed setting a quit date and she states she is on her last pack of cigarettes right now. I will plan to see her back in 2 months to reevaluate.

## 2023-07-23 NOTE — Assessment & Plan Note (Addendum)
BRCA2. She has undergone bilateral mastectomy and bilateral salpingo-oophorectomy.  She does not have a family history of pancreatic cancer, so screening is not currently recommended.  I recommended she follow up with dermatology annually for a skin check due to the increased risk for melanoma.

## 2023-07-23 NOTE — Assessment & Plan Note (Addendum)
She states her last colonoscopy was over 1 year ago with Dr. Charm Barges. She had an episode rectal bleeding, which resolved. I will refer her to Dr. Jennye Boroughs as she will need continued screening.

## 2023-07-24 ENCOUNTER — Telehealth: Payer: Self-pay

## 2023-07-24 NOTE — Telephone Encounter (Signed)
-----   Message from Adah Perl sent at 07/23/2023  6:24 PM EDT ----- Refer to Dr. Jennye Boroughs, single episode of rectal bleeding, Lynch Syndrome, last colonoscopy with Dr. Charm Barges over 1 year ago. Thanks

## 2023-07-24 NOTE — Telephone Encounter (Signed)
Referral faxed to Dr. Camila Li office.

## 2023-08-12 ENCOUNTER — Encounter: Payer: Self-pay | Admitting: Oncology

## 2023-08-21 DIAGNOSIS — Z79899 Other long term (current) drug therapy: Secondary | ICD-10-CM | POA: Insufficient documentation

## 2023-08-27 DIAGNOSIS — R1010 Upper abdominal pain, unspecified: Secondary | ICD-10-CM | POA: Insufficient documentation

## 2023-08-28 ENCOUNTER — Other Ambulatory Visit: Payer: Self-pay

## 2023-08-28 DIAGNOSIS — I2510 Atherosclerotic heart disease of native coronary artery without angina pectoris: Secondary | ICD-10-CM

## 2023-08-28 DIAGNOSIS — E785 Hyperlipidemia, unspecified: Secondary | ICD-10-CM

## 2023-08-28 MED ORDER — REPATHA SURECLICK 140 MG/ML ~~LOC~~ SOAJ: 1.0000 mL | SUBCUTANEOUS | 1 refills | Status: DC

## 2023-09-22 ENCOUNTER — Inpatient Hospital Stay: Payer: 59

## 2023-09-22 ENCOUNTER — Inpatient Hospital Stay: Payer: 59 | Attending: Hematology and Oncology | Admitting: Hematology and Oncology

## 2023-09-22 ENCOUNTER — Telehealth: Payer: Self-pay

## 2023-09-22 ENCOUNTER — Encounter: Payer: Self-pay | Admitting: Hematology and Oncology

## 2023-09-22 ENCOUNTER — Telehealth: Payer: Self-pay | Admitting: Hematology and Oncology

## 2023-09-22 VITALS — BP 117/69 | HR 63 | Temp 97.7°F | Resp 18 | Ht 63.0 in | Wt 125.8 lb

## 2023-09-22 DIAGNOSIS — Z9079 Acquired absence of other genital organ(s): Secondary | ICD-10-CM | POA: Diagnosis not present

## 2023-09-22 DIAGNOSIS — Z86000 Personal history of in-situ neoplasm of breast: Secondary | ICD-10-CM | POA: Diagnosis not present

## 2023-09-22 DIAGNOSIS — D539 Nutritional anemia, unspecified: Secondary | ICD-10-CM | POA: Diagnosis not present

## 2023-09-22 DIAGNOSIS — Z90722 Acquired absence of ovaries, bilateral: Secondary | ICD-10-CM | POA: Diagnosis not present

## 2023-09-22 DIAGNOSIS — C719 Malignant neoplasm of brain, unspecified: Secondary | ICD-10-CM

## 2023-09-22 DIAGNOSIS — Z85841 Personal history of malignant neoplasm of brain: Secondary | ICD-10-CM | POA: Insufficient documentation

## 2023-09-22 DIAGNOSIS — Z1501 Genetic susceptibility to malignant neoplasm of breast: Secondary | ICD-10-CM

## 2023-09-22 DIAGNOSIS — Z15068 Genetic susceptibility to other malignant neoplasm of digestive system: Secondary | ICD-10-CM

## 2023-09-22 DIAGNOSIS — Z720 Tobacco use: Secondary | ICD-10-CM

## 2023-09-22 DIAGNOSIS — Z8542 Personal history of malignant neoplasm of other parts of uterus: Secondary | ICD-10-CM | POA: Insufficient documentation

## 2023-09-22 DIAGNOSIS — Z9013 Acquired absence of bilateral breasts and nipples: Secondary | ICD-10-CM | POA: Insufficient documentation

## 2023-09-22 DIAGNOSIS — Z1509 Genetic susceptibility to other malignant neoplasm: Secondary | ICD-10-CM | POA: Diagnosis not present

## 2023-09-22 DIAGNOSIS — Z90710 Acquired absence of both cervix and uterus: Secondary | ICD-10-CM | POA: Diagnosis not present

## 2023-09-22 DIAGNOSIS — D509 Iron deficiency anemia, unspecified: Secondary | ICD-10-CM | POA: Diagnosis not present

## 2023-09-22 DIAGNOSIS — D510 Vitamin B12 deficiency anemia due to intrinsic factor deficiency: Secondary | ICD-10-CM | POA: Diagnosis not present

## 2023-09-22 DIAGNOSIS — Z15060 Genetic susceptibility to colorectal cancer: Secondary | ICD-10-CM

## 2023-09-22 DIAGNOSIS — C541 Malignant neoplasm of endometrium: Secondary | ICD-10-CM

## 2023-09-22 HISTORY — DX: Iron deficiency anemia, unspecified: D50.9

## 2023-09-22 LAB — CMP (CANCER CENTER ONLY)
ALT: 33 U/L (ref 0–44)
AST: 46 U/L — ABNORMAL HIGH (ref 15–41)
Albumin: 4 g/dL (ref 3.5–5.0)
Alkaline Phosphatase: 60 U/L (ref 38–126)
Anion gap: 10 (ref 5–15)
BUN: 9 mg/dL (ref 6–20)
CO2: 25 mmol/L (ref 22–32)
Calcium: 8.7 mg/dL — ABNORMAL LOW (ref 8.9–10.3)
Chloride: 107 mmol/L (ref 98–111)
Creatinine: 0.98 mg/dL (ref 0.44–1.00)
GFR, Estimated: >60 mL/min (ref >60)
Glucose, Bld: 101 mg/dL — ABNORMAL HIGH (ref 70–99)
Potassium: 4.1 mmol/L (ref 3.5–5.1)
Sodium: 143 mmol/L (ref 135–145)
Total Bilirubin: 0.3 mg/dL (ref <1.2)
Total Protein: 6.3 g/dL — ABNORMAL LOW (ref 6.5–8.1)

## 2023-09-22 LAB — CBC WITH DIFFERENTIAL (CANCER CENTER ONLY)
Abs Immature Granulocytes: 0.01 10*3/uL (ref 0.00–0.07)
Basophils Absolute: 0 10*3/uL (ref 0.0–0.1)
Basophils Relative: 1 %
Eosinophils Absolute: 0.1 10*3/uL (ref 0.0–0.5)
Eosinophils Relative: 2 %
HCT: 33.3 % — ABNORMAL LOW (ref 36.0–46.0)
Hemoglobin: 11 g/dL — ABNORMAL LOW (ref 12.0–15.0)
Immature Granulocytes: 0 %
Lymphocytes Relative: 31 %
Lymphs Abs: 2.1 10*3/uL (ref 0.7–4.0)
MCH: 28.1 pg (ref 26.0–34.0)
MCHC: 33 g/dL (ref 30.0–36.0)
MCV: 85.2 fL (ref 80.0–100.0)
Monocytes Absolute: 0.6 10*3/uL (ref 0.1–1.0)
Monocytes Relative: 8 %
Neutro Abs: 3.9 10*3/uL (ref 1.7–7.7)
Neutrophils Relative %: 58 %
Platelet Count: 206 10*3/uL (ref 150–400)
RBC: 3.91 MIL/uL (ref 3.87–5.11)
RDW: 14.4 % (ref 11.5–15.5)
WBC Count: 6.8 10*3/uL (ref 4.0–10.5)
nRBC: 0 % (ref 0.0–0.2)
nRBC: 0 /100{WBCs}

## 2023-09-22 LAB — FERRITIN: Ferritin: 6 ng/mL — ABNORMAL LOW (ref 11–307)

## 2023-09-22 LAB — LACTATE DEHYDROGENASE: LDH: 202 U/L — ABNORMAL HIGH (ref 98–192)

## 2023-09-22 LAB — VITAMIN B12: Vitamin B-12: 345 pg/mL (ref 180–914)

## 2023-09-22 NOTE — Assessment & Plan Note (Signed)
The patient reports smoking 1 pack cigarettes a day for the past 12-15 years. Although she did not remember this, she underwent low dose CT chest for lung cancer screening in July ordered by her PCP, which was not concerning for malignancy but did reveal mild diffuse bronchial wall thickening with mild central lobar and paraseptal emphysema, as well as aortic atherosclerosis. I gave her Chantix in September, which she states has helped her. She states she has not a cigarette in 3 days. She has occasionally vaped. I encouraged her to maintain abstinence from tobacco and plan to stop vaping as soon as possible.
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
## 2023-09-22 NOTE — Assessment & Plan Note (Signed)
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 Testing for IDH was negative. She was treated radiation and temozolomide chemotherapy along with olaparib.  MRI head in September 2023 revealed resolution of contrast-enhancement at the site of the paramedian inferior left frontal tumor with near resolution of associated hyperintense T2 WI signal, without evidence of new disease.  She no longer follows with neurosurgery at [facility], so we have ordered her follow up MRI brain.  MRI head from September did not reveal any evidence of recurrence or other abnormality.

## 2023-09-22 NOTE — Telephone Encounter (Signed)
Patient has been scheduled for follow-up visit per 09/22/23 LOS.  Pt given an appt calendar with date and time.

## 2023-09-22 NOTE — Telephone Encounter (Signed)
CBC and CMP faxed

## 2023-09-22 NOTE — Assessment & Plan Note (Signed)
History of stage IA endometrial cancer diagnosed in 2015. She was treated with TAH/BSO with pelvic and paraaortic lymphadenectomy. Lymph nodes were negative for metastasis. She received adjuvant chemotherapy with carboplatin and paclitaxel for 6 cycles, as well as brachytherapy. She remains without evidence of recurrence.

## 2023-09-22 NOTE — Telephone Encounter (Signed)
-----   Message from Adah Perl sent at 09/22/2023 10:12 AM EST ----- Please fax labs to Drusilla Kanner, NP. Thanks

## 2023-09-22 NOTE — Assessment & Plan Note (Addendum)
She has seen Dr. Jennye Boroughs and is scheduled for colonoscopy in January.

## 2023-09-22 NOTE — Assessment & Plan Note (Addendum)
New iron deficiency anemia, will get that scheduled if she is in agreement. She is scheduled for colonoscopy in January with Dr. Jennye Boroughs. I will ask him to consider EGD as well. . Due to the severity, I recommend IV iron. Will plan to see her back in 6 weeks to assess response.

## 2023-09-22 NOTE — Assessment & Plan Note (Signed)
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  She was treated with bilateral mastectomy and reconstruction. She remains without evidence of recurrence.

## 2023-09-22 NOTE — Assessment & Plan Note (Signed)
BRCA2. She has undergone bilateral mastectomy and bilateral salpingo-oophorectomy. This also increases her risk for pancreatic cancer, so screening is not currently recommended.  I recommended she follow up with dermatology annually for a skin check due to the increased risk for melanoma.

## 2023-09-23 LAB — HAPTOGLOBIN: Haptoglobin: 214 mg/dL (ref 33–346)

## 2023-09-25 ENCOUNTER — Encounter: Payer: Self-pay | Admitting: Hematology and Oncology

## 2023-09-25 ENCOUNTER — Other Ambulatory Visit: Payer: Self-pay | Admitting: Hematology and Oncology

## 2023-09-29 ENCOUNTER — Inpatient Hospital Stay: Payer: 59

## 2023-09-29 ENCOUNTER — Encounter: Payer: Self-pay | Admitting: Hematology and Oncology

## 2023-09-29 VITALS — BP 120/67 | HR 70 | Temp 98.2°F | Resp 20 | Ht 63.0 in | Wt 123.1 lb

## 2023-09-29 DIAGNOSIS — D509 Iron deficiency anemia, unspecified: Secondary | ICD-10-CM | POA: Diagnosis not present

## 2023-09-29 DIAGNOSIS — C541 Malignant neoplasm of endometrium: Secondary | ICD-10-CM

## 2023-09-29 MED ORDER — LORATADINE 10 MG PO TABS
10.0000 mg | ORAL_TABLET | Freq: Once | ORAL | Status: AC
  Administered 2023-09-29: 10 mg via ORAL
  Filled 2023-09-29: qty 1

## 2023-09-29 MED ORDER — ACETAMINOPHEN 325 MG PO TABS
650.0000 mg | ORAL_TABLET | Freq: Once | ORAL | Status: AC
  Administered 2023-09-29: 650 mg via ORAL
  Filled 2023-09-29: qty 2

## 2023-09-29 MED ORDER — SODIUM CHLORIDE 0.9% FLUSH
10.0000 mL | Freq: Two times a day (BID) | INTRAVENOUS | Status: DC
  Administered 2023-09-29: 10 mL via INTRAVENOUS

## 2023-09-29 MED ORDER — IRON SUCROSE 20 MG/ML IV SOLN
200.0000 mg | Freq: Once | INTRAVENOUS | Status: AC
Start: 2023-09-29 — End: 2023-09-29
  Administered 2023-09-29: 200 mg via INTRAVENOUS
  Filled 2023-09-29: qty 10

## 2023-09-29 NOTE — Patient Instructions (Signed)
Iron Sucrose Injection What is this medication? IRON SUCROSE (EYE ern SOO krose) treats low levels of iron (iron deficiency anemia) in people with kidney disease. Iron is a mineral that plays an important role in making red blood cells, which carry oxygen from your lungs to the rest of your body. This medicine may be used for other purposes; ask your health care provider or pharmacist if you have questions. COMMON BRAND NAME(S): Venofer What should I tell my care team before I take this medication? They need to know if you have any of these conditions: Anemia not caused by low iron levels Heart disease High levels of iron in the blood Kidney disease Liver disease An unusual or allergic reaction to iron, other medications, foods, dyes, or preservatives Pregnant or trying to get pregnant Breastfeeding How should I use this medication? This medication is for infusion into a vein. It is given in a hospital or clinic setting. Talk to your care team about the use of this medication in children. While this medication may be prescribed for children as young as 2 years for selected conditions, precautions do apply. Overdosage: If you think you have taken too much of this medicine contact a poison control center or emergency room at once. NOTE: This medicine is only for you. Do not share this medicine with others. What if I miss a dose? Keep appointments for follow-up doses. It is important not to miss your dose. Call your care team if you are unable to keep an appointment. What may interact with this medication? Do not take this medication with any of the following: Deferoxamine Dimercaprol Other iron products This medication may also interact with the following: Chloramphenicol Deferasirox This list may not describe all possible interactions. Give your health care provider a list of all the medicines, herbs, non-prescription drugs, or dietary supplements you use. Also tell them if you smoke,  drink alcohol, or use illegal drugs. Some items may interact with your medicine. What should I watch for while using this medication? Visit your care team regularly. Tell your care team if your symptoms do not start to get better or if they get worse. You may need blood work done while you are taking this medication. You may need to follow a special diet. Talk to your care team. Foods that contain iron include: whole grains/cereals, dried fruits, beans, or peas, leafy green vegetables, and organ meats (liver, kidney). What side effects may I notice from receiving this medication? Side effects that you should report to your care team as soon as possible: Allergic reactions--skin rash, itching, hives, swelling of the face, lips, tongue, or throat Low blood pressure--dizziness, feeling faint or lightheaded, blurry vision Shortness of breath Side effects that usually do not require medical attention (report to your care team if they continue or are bothersome): Flushing Headache Joint pain Muscle pain Nausea Pain, redness, or irritation at injection site This list may not describe all possible side effects. Call your doctor for medical advice about side effects. You may report side effects to FDA at 1-800-FDA-1088. Where should I keep my medication? This medication is given in a hospital or clinic. It will not be stored at home. NOTE: This sheet is a summary. It may not cover all possible information. If you have questions about this medicine, talk to your doctor, pharmacist, or health care provider.  2024 Elsevier/Gold Standard (2023-03-27 00:00:00)

## 2023-10-05 ENCOUNTER — Inpatient Hospital Stay: Payer: 59 | Attending: Hematology and Oncology

## 2023-10-05 VITALS — BP 113/54 | HR 78 | Temp 99.1°F | Resp 18 | Ht 63.0 in

## 2023-10-05 DIAGNOSIS — C541 Malignant neoplasm of endometrium: Secondary | ICD-10-CM

## 2023-10-05 DIAGNOSIS — D509 Iron deficiency anemia, unspecified: Secondary | ICD-10-CM | POA: Diagnosis present

## 2023-10-05 MED ORDER — LORATADINE 10 MG PO TABS
10.0000 mg | ORAL_TABLET | Freq: Once | ORAL | Status: AC
  Administered 2023-10-05: 10 mg via ORAL
  Filled 2023-10-05: qty 1

## 2023-10-05 MED ORDER — IRON SUCROSE 20 MG/ML IV SOLN
200.0000 mg | Freq: Once | INTRAVENOUS | Status: AC
  Administered 2023-10-05: 200 mg via INTRAVENOUS
  Filled 2023-10-05: qty 10

## 2023-10-05 MED ORDER — SODIUM CHLORIDE 0.9% FLUSH: 10.0000 mL | Freq: Two times a day (BID) | INTRAVENOUS | Status: DC

## 2023-10-05 MED ORDER — ACETAMINOPHEN 325 MG PO TABS
650.0000 mg | ORAL_TABLET | Freq: Once | ORAL | Status: AC
  Administered 2023-10-05: 650 mg via ORAL
  Filled 2023-10-05: qty 2

## 2023-10-05 NOTE — Patient Instructions (Signed)
Iron-Rich Diet  Iron is a mineral that helps your body produce hemoglobin. Hemoglobin is a protein in red blood cells that carries oxygen to your body's tissues. Eating too little iron may cause you to feel weak and tired, and it can increase your risk of infection. Iron is naturally found in many foods, and many foods have iron added to them (are iron-fortified). You may need to follow an iron-rich diet if you do not have enough iron in your body due to certain medical conditions. The amount of iron that you need each day depends on your age, your sex, and any medical conditions you have. Follow instructions from your health care provider or a dietitian about how much iron you should eat each day. What are tips for following this plan? Reading food labels Check food labels to see how many milligrams (mg) of iron are in each serving. Cooking Cook foods in pots and pans that are made from iron. Take these steps to make it easier for your body to absorb iron from certain foods: Soak beans overnight before cooking. Soak whole grains overnight and drain them before using. Ferment flours before baking, such as by using yeast in bread dough. Meal planning When you eat foods that contain iron, you should eat them with foods that are high in vitamin C. These include oranges, peppers, tomatoes, potatoes, and mangoes. Vitamin C helps your body absorb iron. Certain foods and drinks prevent your body from absorbing iron properly. Avoid eating these foods in the same meal as iron-rich foods or with iron supplements. These foods include: Coffee, black tea, and red wine. Milk, dairy products, and foods that are high in calcium. Beans and soybeans. Whole grains. General information Take iron supplements only as told by your health care provider. An overdose of iron can be life-threatening. If you were prescribed iron supplements, take them with orange juice or a vitamin C supplement. When you eat  iron-fortified foods or take an iron supplement, you should also eat foods that naturally contain iron, such as meat, poultry, and fish. Eating naturally iron-rich foods helps your body absorb the iron that is added to other foods or contained in a supplement. Iron from animal sources is better absorbed than iron from plant sources. What foods should I eat? Fruits Prunes. Raisins. Eat fruits high in vitamin C, such as oranges, grapefruits, and strawberries, with iron-rich foods. Vegetables Spinach (cooked). Green peas. Broccoli. Fermented vegetables. Eat vegetables high in vitamin C, such as leafy greens, potatoes, bell peppers, and tomatoes, with iron-rich foods. Grains Iron-fortified breakfast cereal. Iron-fortified whole-wheat bread. Enriched rice. Sprouted grains. Meats and other proteins Beef liver. Beef. [place]. Chicken. Oysters. Shrimp. Tuna. Sardines. Chickpeas. Nuts. Tofu. Pumpkin seeds. Beverages Tomato juice. Fresh orange juice. Prune juice. Hibiscus tea. Iron-fortified instant breakfast shakes. Sweets and desserts Blackstrap molasses. Seasonings and condiments Tahini. Fermented soy sauce. Other foods Wheat germ. The items listed above may not be a complete list of recommended foods and beverages. Contact a dietitian for more information. What foods should I limit? These are foods that should be limited while eating iron-rich foods as they can reduce the absorption of iron in your body. Grains Whole grains. Bran cereal. Bran flour. Meats and other proteins Soybeans. Products made from soy protein. Black beans. Lentils. Mung beans. Split peas. Dairy Milk. Cream. Cheese. Yogurt. Cottage cheese. Beverages Coffee. Black tea. Red wine. Sweets and desserts Cocoa. Chocolate. Ice cream. Seasonings and condiments Basil. Oregano. Large amounts of parsley. The items listed  above may not be a complete list of foods and beverages you should limit. Contact a dietitian for more  information. Summary Iron is a mineral that helps your body produce hemoglobin. Hemoglobin is a protein in red blood cells that carries oxygen to your body's tissues. Iron is naturally found in many foods, and many foods have iron added to them (are iron-fortified). When you eat foods that contain iron, you should eat them with foods that are high in vitamin C. Vitamin C helps your body absorb iron. Certain foods and drinks prevent your body from absorbing iron properly, such as whole grains and dairy products. You should avoid eating these foods in the same meal as iron-rich foods or with iron supplements. This information is not intended to replace advice given to you by your health care provider. Make sure you discuss any questions you have with your health care provider. Document Revised: 10/01/2020 Document Reviewed: 10/01/2020 Elsevier Patient Education  2024 [employer]. Iron Sucrose Injection What is this medication? IRON SUCROSE (EYE ern SOO krose) treats low levels of iron (iron deficiency anemia) in people with kidney disease. Iron is a mineral that plays an important role in making red blood cells, which carry oxygen from your lungs to the rest of your body. This medicine may be used for other purposes; ask your health care provider or pharmacist if you have questions. COMMON BRAND NAME(S): Venofer What should I tell my care team before I take this medication? They need to know if you have any of these conditions: Anemia not caused by low iron levels Heart disease High levels of iron in the blood Kidney disease Liver disease An unusual or allergic reaction to iron, other medications, foods, dyes, or preservatives Pregnant or trying to get pregnant Breastfeeding How should I use this medication? This medication is for infusion into a vein. It is given in a hospital or clinic setting. Talk to your care team about the use of this medication in children. While this medication may  be prescribed for children as young as 2 years for selected conditions, precautions do apply. Overdosage: If you think you have taken too much of this medicine contact a poison control center or emergency room at once. NOTE: This medicine is only for you. Do not share this medicine with others. What if I miss a dose? Keep appointments for follow-up doses. It is important not to miss your dose. Call your care team if you are unable to keep an appointment. What may interact with this medication? Do not take this medication with any of the following: Deferoxamine Dimercaprol Other iron products This medication may also interact with the following: Chloramphenicol Deferasirox This list may not describe all possible interactions. Give your health care provider a list of all the medicines, herbs, non-prescription drugs, or dietary supplements you use. Also tell them if you smoke, drink alcohol, or use illegal drugs. Some items may interact with your medicine. What should I watch for while using this medication? Visit your care team regularly. Tell your care team if your symptoms do not start to get better or if they get worse. You may need blood work done while you are taking this medication. You may need to follow a special diet. Talk to your care team. Foods that contain iron include: whole grains/cereals, dried fruits, beans, or peas, leafy green vegetables, and organ meats (liver, kidney). What side effects may I notice from receiving this medication? Side effects that you should report to your  care team as soon as possible: Allergic reactions--skin rash, itching, hives, swelling of the face, lips, tongue, or throat Low blood pressure--dizziness, feeling faint or lightheaded, blurry vision Shortness of breath Side effects that usually do not require medical attention (report to your care team if they continue or are bothersome): Flushing Headache Joint pain Muscle pain Nausea Pain, redness,  or irritation at injection site This list may not describe all possible side effects. Call your doctor for medical advice about side effects. You may report side effects to FDA at 1-800-FDA-1088. Where should I keep my medication? This medication is given in a hospital or clinic. It will not be stored at home. NOTE: This sheet is a summary. It may not cover all possible information. If you have questions about this medicine, talk to your doctor, pharmacist, or health care provider.  2024 Elsevier/Gold Standard (2023-03-27 00:00:00)

## 2023-10-05 NOTE — Progress Notes (Signed)
Pt refused to stay the full wait time.  States she has an appointment she needs to get to and her ride will leave here.

## 2023-10-06 ENCOUNTER — Encounter: Payer: Self-pay | Admitting: Hematology and Oncology

## 2023-10-06 ENCOUNTER — Inpatient Hospital Stay: Payer: 59

## 2023-10-06 VITALS — BP 130/66 | HR 72 | Temp 98.4°F | Resp 18 | Ht 63.0 in | Wt 122.0 lb

## 2023-10-06 DIAGNOSIS — D509 Iron deficiency anemia, unspecified: Secondary | ICD-10-CM

## 2023-10-06 DIAGNOSIS — C541 Malignant neoplasm of endometrium: Secondary | ICD-10-CM

## 2023-10-06 MED ORDER — SODIUM CHLORIDE 0.9% FLUSH
10.0000 mL | Freq: Two times a day (BID) | INTRAVENOUS | Status: DC
  Administered 2023-10-06: 10 mL via INTRAVENOUS

## 2023-10-06 MED ORDER — IRON SUCROSE 20 MG/ML IV SOLN
200.0000 mg | Freq: Once | INTRAVENOUS | Status: AC
  Administered 2023-10-06: 200 mg via INTRAVENOUS
  Filled 2023-10-06: qty 10

## 2023-10-06 MED ORDER — LORATADINE 10 MG PO TABS: 10.0000 mg | ORAL_TABLET | Freq: Once | ORAL | Status: DC

## 2023-10-06 MED ORDER — ACETAMINOPHEN 325 MG PO TABS: 650.0000 mg | ORAL_TABLET | Freq: Once | ORAL | Status: DC

## 2023-10-06 NOTE — Patient Instructions (Signed)
Iron Sucrose Injection What is this medication? IRON SUCROSE (EYE ern SOO krose) treats low levels of iron (iron deficiency anemia) in people with kidney disease. Iron is a mineral that plays an important role in making red blood cells, which carry oxygen from your lungs to the rest of your body. This medicine may be used for other purposes; ask your health care provider or pharmacist if you have questions. COMMON BRAND NAME(S): Venofer What should I tell my care team before I take this medication? They need to know if you have any of these conditions: Anemia not caused by low iron levels Heart disease High levels of iron in the blood Kidney disease Liver disease An unusual or allergic reaction to iron, other medications, foods, dyes, or preservatives Pregnant or trying to get pregnant Breastfeeding How should I use this medication? This medication is for infusion into a vein. It is given in a hospital or clinic setting. Talk to your care team about the use of this medication in children. While this medication may be prescribed for children as young as 2 years for selected conditions, precautions do apply. Overdosage: If you think you have taken too much of this medicine contact a poison control center or emergency room at once. NOTE: This medicine is only for you. Do not share this medicine with others. What if I miss a dose? Keep appointments for follow-up doses. It is important not to miss your dose. Call your care team if you are unable to keep an appointment. What may interact with this medication? Do not take this medication with any of the following: Deferoxamine Dimercaprol Other iron products This medication may also interact with the following: Chloramphenicol Deferasirox This list may not describe all possible interactions. Give your health care provider a list of all the medicines, herbs, non-prescription drugs, or dietary supplements you use. Also tell them if you smoke,  drink alcohol, or use illegal drugs. Some items may interact with your medicine. What should I watch for while using this medication? Visit your care team regularly. Tell your care team if your symptoms do not start to get better or if they get worse. You may need blood work done while you are taking this medication. You may need to follow a special diet. Talk to your care team. Foods that contain iron include: whole grains/cereals, dried fruits, beans, or peas, leafy green vegetables, and organ meats (liver, kidney). What side effects may I notice from receiving this medication? Side effects that you should report to your care team as soon as possible: Allergic reactions--skin rash, itching, hives, swelling of the face, lips, tongue, or throat Low blood pressure--dizziness, feeling faint or lightheaded, blurry vision Shortness of breath Side effects that usually do not require medical attention (report to your care team if they continue or are bothersome): Flushing Headache Joint pain Muscle pain Nausea Pain, redness, or irritation at injection site This list may not describe all possible side effects. Call your doctor for medical advice about side effects. You may report side effects to FDA at 1-800-FDA-1088. Where should I keep my medication? This medication is given in a hospital or clinic. It will not be stored at home. NOTE: This sheet is a summary. It may not cover all possible information. If you have questions about this medicine, talk to your doctor, pharmacist, or health care provider.  2024 Elsevier/Gold Standard (2023-03-27 00:00:00)

## 2023-10-07 ENCOUNTER — Inpatient Hospital Stay: Payer: 59

## 2023-10-09 ENCOUNTER — Inpatient Hospital Stay: Payer: 59

## 2023-10-09 VITALS — BP 124/74 | HR 79 | Temp 98.4°F | Resp 16 | Wt 122.0 lb

## 2023-10-09 DIAGNOSIS — D509 Iron deficiency anemia, unspecified: Secondary | ICD-10-CM | POA: Diagnosis not present

## 2023-10-09 DIAGNOSIS — C541 Malignant neoplasm of endometrium: Secondary | ICD-10-CM

## 2023-10-09 MED ORDER — SODIUM CHLORIDE 0.9% FLUSH
10.0000 mL | Freq: Two times a day (BID) | INTRAVENOUS | Status: DC
Start: 2023-10-09 — End: 2023-10-09
  Administered 2023-10-09: 10 mL via INTRAVENOUS

## 2023-10-09 MED ORDER — IRON SUCROSE 20 MG/ML IV SOLN
200.0000 mg | Freq: Once | INTRAVENOUS | Status: AC
  Administered 2023-10-09: 200 mg via INTRAVENOUS
  Filled 2023-10-09: qty 10

## 2023-10-09 MED ORDER — LORATADINE 10 MG PO TABS: 10.0000 mg | ORAL_TABLET | Freq: Once | ORAL | Status: DC

## 2023-10-09 MED ORDER — ACETAMINOPHEN 325 MG PO TABS: 650.0000 mg | ORAL_TABLET | Freq: Once | ORAL | Status: DC

## 2023-10-09 NOTE — Progress Notes (Signed)
Patient refuses 30 minute after iron observation. Discharged home stable.

## 2023-10-12 ENCOUNTER — Encounter: Payer: Self-pay | Admitting: Hematology and Oncology

## 2023-10-12 ENCOUNTER — Inpatient Hospital Stay: Payer: 59

## 2023-10-12 VITALS — BP 131/70 | HR 75 | Temp 98.0°F | Resp 18 | Wt 122.0 lb

## 2023-10-12 DIAGNOSIS — D509 Iron deficiency anemia, unspecified: Secondary | ICD-10-CM

## 2023-10-12 DIAGNOSIS — C541 Malignant neoplasm of endometrium: Secondary | ICD-10-CM

## 2023-10-12 MED ORDER — SODIUM CHLORIDE 0.9% FLUSH: 10.0000 mL | Freq: Two times a day (BID) | INTRAVENOUS | Status: DC

## 2023-10-12 MED ORDER — ACETAMINOPHEN 325 MG PO TABS: 650.0000 mg | ORAL_TABLET | Freq: Once | ORAL | Status: DC

## 2023-10-12 MED ORDER — LORATADINE 10 MG PO TABS: 10.0000 mg | ORAL_TABLET | Freq: Once | ORAL | Status: DC

## 2023-10-12 MED ORDER — IRON SUCROSE 20 MG/ML IV SOLN
200.0000 mg | Freq: Once | INTRAVENOUS | Status: AC
  Administered 2023-10-12: 200 mg via INTRAVENOUS
  Filled 2023-10-12: qty 10

## 2023-10-12 NOTE — Patient Instructions (Signed)
Iron Sucrose Injection What is this medication? IRON SUCROSE (EYE ern SOO krose) treats low levels of iron (iron deficiency anemia) in people with kidney disease. Iron is a mineral that plays an important role in making red blood cells, which carry oxygen from your lungs to the rest of your body. This medicine may be used for other purposes; ask your health care provider or pharmacist if you have questions. COMMON BRAND NAME(S): Venofer What should I tell my care team before I take this medication? They need to know if you have any of these conditions: Anemia not caused by low iron levels Heart disease High levels of iron in the blood Kidney disease Liver disease An unusual or allergic reaction to iron, other medications, foods, dyes, or preservatives Pregnant or trying to get pregnant Breastfeeding How should I use this medication? This medication is for infusion into a vein. It is given in a hospital or clinic setting. Talk to your care team about the use of this medication in children. While this medication may be prescribed for children as young as 2 years for selected conditions, precautions do apply. Overdosage: If you think you have taken too much of this medicine contact a poison control center or emergency room at once. NOTE: This medicine is only for you. Do not share this medicine with others. What if I miss a dose? Keep appointments for follow-up doses. It is important not to miss your dose. Call your care team if you are unable to keep an appointment. What may interact with this medication? Do not take this medication with any of the following: Deferoxamine Dimercaprol Other iron products This medication may also interact with the following: Chloramphenicol Deferasirox This list may not describe all possible interactions. Give your health care provider a list of all the medicines, herbs, non-prescription drugs, or dietary supplements you use. Also tell them if you smoke,  drink alcohol, or use illegal drugs. Some items may interact with your medicine. What should I watch for while using this medication? Visit your care team regularly. Tell your care team if your symptoms do not start to get better or if they get worse. You may need blood work done while you are taking this medication. You may need to follow a special diet. Talk to your care team. Foods that contain iron include: whole grains/cereals, dried fruits, beans, or peas, leafy green vegetables, and organ meats (liver, kidney). What side effects may I notice from receiving this medication? Side effects that you should report to your care team as soon as possible: Allergic reactions--skin rash, itching, hives, swelling of the face, lips, tongue, or throat Low blood pressure--dizziness, feeling faint or lightheaded, blurry vision Shortness of breath Side effects that usually do not require medical attention (report to your care team if they continue or are bothersome): Flushing Headache Joint pain Muscle pain Nausea Pain, redness, or irritation at injection site This list may not describe all possible side effects. Call your doctor for medical advice about side effects. You may report side effects to FDA at 1-800-FDA-1088. Where should I keep my medication? This medication is given in a hospital or clinic. It will not be stored at home. NOTE: This sheet is a summary. It may not cover all possible information. If you have questions about this medicine, talk to your doctor, pharmacist, or health care provider.  2024 Elsevier/Gold Standard (2023-03-27 00:00:00)

## 2023-10-12 NOTE — Progress Notes (Signed)
Patient declines post iron observation time. Discharged home, stable-

## 2023-11-11 ENCOUNTER — Other Ambulatory Visit: Payer: Self-pay

## 2023-11-11 ENCOUNTER — Telehealth: Payer: Self-pay

## 2023-11-11 DIAGNOSIS — I2510 Atherosclerotic heart disease of native coronary artery without angina pectoris: Secondary | ICD-10-CM

## 2023-11-11 DIAGNOSIS — E785 Hyperlipidemia, unspecified: Secondary | ICD-10-CM

## 2023-11-11 MED ORDER — REPATHA SURECLICK 140 MG/ML ~~LOC~~ SOAJ: 1.0000 mL | SUBCUTANEOUS | 0 refills | Status: DC

## 2023-11-11 MED ORDER — ROSUVASTATIN CALCIUM 40 MG PO TABS: 40.0000 mg | ORAL_TABLET | Freq: Every day | ORAL | 0 refills | Status: AC

## 2023-11-11 NOTE — Telephone Encounter (Signed)
 Prescription sent to pharmacy.

## 2023-11-23 ENCOUNTER — Encounter: Payer: Self-pay | Admitting: Hematology and Oncology

## 2023-12-01 DIAGNOSIS — D513 Other dietary vitamin B12 deficiency anemia: Secondary | ICD-10-CM | POA: Insufficient documentation

## 2023-12-01 DIAGNOSIS — J019 Acute sinusitis, unspecified: Secondary | ICD-10-CM | POA: Insufficient documentation

## 2023-12-15 ENCOUNTER — Encounter: Payer: Self-pay | Admitting: Hematology and Oncology

## 2023-12-15 ENCOUNTER — Other Ambulatory Visit: Payer: Self-pay | Admitting: Family

## 2023-12-15 ENCOUNTER — Ambulatory Visit
Admission: RE | Admit: 2023-12-15 | Discharge: 2023-12-15 | Disposition: A | Discharge status: Home / Self Care | Payer: 59 | Source: Ambulatory Visit | Attending: Family | Admitting: Family

## 2023-12-15 DIAGNOSIS — Z1231 Encounter for screening mammogram for malignant neoplasm of breast: Secondary | ICD-10-CM

## 2023-12-21 DIAGNOSIS — M5431 Sciatica, right side: Secondary | ICD-10-CM | POA: Insufficient documentation

## 2023-12-21 DIAGNOSIS — M5432 Sciatica, left side: Secondary | ICD-10-CM | POA: Insufficient documentation

## 2023-12-21 DIAGNOSIS — M545 Low back pain, unspecified: Secondary | ICD-10-CM | POA: Insufficient documentation

## 2023-12-21 HISTORY — DX: Low back pain, unspecified: M54.50

## 2023-12-21 HISTORY — DX: Sciatica, left side: M54.32

## 2023-12-21 HISTORY — DX: Sciatica, right side: M54.31

## 2023-12-23 ENCOUNTER — Inpatient Hospital Stay: Payer: 59

## 2023-12-23 ENCOUNTER — Inpatient Hospital Stay: Payer: 59 | Admitting: Oncology

## 2023-12-25 ENCOUNTER — Inpatient Hospital Stay: Payer: 59 | Attending: Hematology and Oncology

## 2023-12-25 ENCOUNTER — Other Ambulatory Visit: Payer: Self-pay | Admitting: Oncology

## 2023-12-25 ENCOUNTER — Inpatient Hospital Stay (HOSPITAL_BASED_OUTPATIENT_CLINIC_OR_DEPARTMENT_OTHER): Payer: 59 | Admitting: Oncology

## 2023-12-25 ENCOUNTER — Encounter: Payer: Self-pay | Admitting: Oncology

## 2023-12-25 VITALS — BP 115/62 | HR 90 | Temp 98.6°F | Resp 18 | Ht 63.0 in | Wt 123.8 lb

## 2023-12-25 DIAGNOSIS — Z1501 Genetic susceptibility to malignant neoplasm of breast: Secondary | ICD-10-CM | POA: Diagnosis not present

## 2023-12-25 DIAGNOSIS — Z9013 Acquired absence of bilateral breasts and nipples: Secondary | ICD-10-CM | POA: Insufficient documentation

## 2023-12-25 DIAGNOSIS — R7989 Other specified abnormal findings of blood chemistry: Secondary | ICD-10-CM | POA: Diagnosis not present

## 2023-12-25 DIAGNOSIS — Z9079 Acquired absence of other genital organ(s): Secondary | ICD-10-CM | POA: Diagnosis not present

## 2023-12-25 DIAGNOSIS — Z800 Family history of malignant neoplasm of digestive organs: Secondary | ICD-10-CM | POA: Diagnosis not present

## 2023-12-25 DIAGNOSIS — D539 Nutritional anemia, unspecified: Secondary | ICD-10-CM

## 2023-12-25 DIAGNOSIS — Z86000 Personal history of in-situ neoplasm of breast: Secondary | ICD-10-CM | POA: Diagnosis not present

## 2023-12-25 DIAGNOSIS — Z808 Family history of malignant neoplasm of other organs or systems: Secondary | ICD-10-CM | POA: Diagnosis not present

## 2023-12-25 DIAGNOSIS — Z1509 Genetic susceptibility to other malignant neoplasm: Secondary | ICD-10-CM

## 2023-12-25 DIAGNOSIS — Z90710 Acquired absence of both cervix and uterus: Secondary | ICD-10-CM | POA: Insufficient documentation

## 2023-12-25 DIAGNOSIS — D509 Iron deficiency anemia, unspecified: Secondary | ICD-10-CM | POA: Diagnosis present

## 2023-12-25 DIAGNOSIS — Z90722 Acquired absence of ovaries, bilateral: Secondary | ICD-10-CM | POA: Diagnosis not present

## 2023-12-25 DIAGNOSIS — Z8542 Personal history of malignant neoplasm of other parts of uterus: Secondary | ICD-10-CM | POA: Diagnosis not present

## 2023-12-25 DIAGNOSIS — C719 Malignant neoplasm of brain, unspecified: Secondary | ICD-10-CM

## 2023-12-25 DIAGNOSIS — C541 Malignant neoplasm of endometrium: Secondary | ICD-10-CM | POA: Diagnosis not present

## 2023-12-25 LAB — CBC WITH DIFFERENTIAL (CANCER CENTER ONLY)
Abs Immature Granulocytes: 0.04 10*3/uL (ref 0.00–0.07)
Basophils Absolute: 0 10*3/uL (ref 0.0–0.1)
Basophils Relative: 0 %
Eosinophils Absolute: 0 10*3/uL (ref 0.0–0.5)
Eosinophils Relative: 0 %
HCT: 38.1 % (ref 36.0–46.0)
Hemoglobin: 12.4 g/dL (ref 12.0–15.0)
Immature Granulocytes: 0 %
Lymphocytes Relative: 8 %
Lymphs Abs: 0.8 10*3/uL (ref 0.7–4.0)
MCH: 29.7 pg (ref 26.0–34.0)
MCHC: 32.5 g/dL (ref 30.0–36.0)
MCV: 91.1 fL (ref 80.0–100.0)
Monocytes Absolute: 0.1 10*3/uL (ref 0.1–1.0)
Monocytes Relative: 1 %
Neutro Abs: 9.3 10*3/uL — ABNORMAL HIGH (ref 1.7–7.7)
Neutrophils Relative %: 91 %
Platelet Count: 241 10*3/uL (ref 150–400)
RBC: 4.18 MIL/uL (ref 3.87–5.11)
RDW: 15.2 % (ref 11.5–15.5)
WBC Count: 10.3 10*3/uL (ref 4.0–10.5)
nRBC: 0 % (ref 0.0–0.2)
nRBC: 0 /100{WBCs}

## 2023-12-25 LAB — CMP (CANCER CENTER ONLY)
ALT: 10 U/L (ref 0–44)
AST: 18 U/L (ref 15–41)
Albumin: 3.6 g/dL (ref 3.5–5.0)
Alkaline Phosphatase: 65 U/L (ref 38–126)
Anion gap: 11 (ref 5–15)
BUN: 15 mg/dL (ref 6–20)
CO2: 24 mmol/L (ref 22–32)
Calcium: 8.7 mg/dL — ABNORMAL LOW (ref 8.9–10.3)
Chloride: 105 mmol/L (ref 98–111)
Creatinine: 1.16 mg/dL — ABNORMAL HIGH (ref 0.44–1.00)
GFR, Estimated: 54 mL/min — ABNORMAL LOW (ref >60)
Glucose, Bld: 169 mg/dL — ABNORMAL HIGH (ref 70–99)
Potassium: 4.1 mmol/L (ref 3.5–5.1)
Sodium: 140 mmol/L (ref 135–145)
Total Bilirubin: <0.2 mg/dL (ref 0.0–1.2)
Total Protein: 6.2 g/dL — ABNORMAL LOW (ref 6.5–8.1)

## 2023-12-25 LAB — IRON AND TIBC
Iron: 82 ug/dL (ref 28–170)
Saturation Ratios: 31 % (ref 10.4–31.8)
TIBC: 263 ug/dL (ref 250–450)
UIBC: 181 ug/dL

## 2023-12-25 LAB — FERRITIN: Ferritin: 124 ng/mL (ref 11–307)

## 2023-12-25 NOTE — Progress Notes (Signed)
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

## 2023-12-28 ENCOUNTER — Other Ambulatory Visit: Payer: Self-pay | Admitting: Pharmacist

## 2023-12-28 DIAGNOSIS — I2510 Atherosclerotic heart disease of native coronary artery without angina pectoris: Secondary | ICD-10-CM

## 2023-12-28 DIAGNOSIS — E785 Hyperlipidemia, unspecified: Secondary | ICD-10-CM

## 2023-12-28 MED ORDER — REPATHA SURECLICK 140 MG/ML ~~LOC~~ SOAJ: 1.0000 mL | SUBCUTANEOUS | 2 refills | Status: DC

## 2023-12-28 NOTE — Telephone Encounter (Signed)
 Called pt and made her aware she needs apt for more refills. Transferred her to scheduler.

## 2024-01-03 ENCOUNTER — Encounter: Payer: Self-pay | Admitting: Hematology and Oncology

## 2024-01-14 ENCOUNTER — Ambulatory Visit (HOSPITAL_BASED_OUTPATIENT_CLINIC_OR_DEPARTMENT_OTHER)
Admission: RE | Admit: 2024-01-14 | Discharge: 2024-01-14 | Disposition: A | Discharge status: Home / Self Care | Payer: 59 | Source: Ambulatory Visit | Attending: Oncology | Admitting: Oncology

## 2024-01-14 DIAGNOSIS — C719 Malignant neoplasm of brain, unspecified: Secondary | ICD-10-CM | POA: Diagnosis not present

## 2024-01-14 MED ORDER — GADOBUTROL 1 MMOL/ML IV SOLN
5.6000 mL | Freq: Once | INTRAVENOUS | Status: AC | PRN
  Administered 2024-01-14: 5.6 mL via INTRAVENOUS

## 2024-01-15 ENCOUNTER — Other Ambulatory Visit: Payer: Self-pay | Admitting: Hematology and Oncology

## 2024-02-06 ENCOUNTER — Other Ambulatory Visit: Payer: Self-pay | Admitting: Hematology and Oncology

## 2024-02-06 DIAGNOSIS — F17209 Nicotine dependence, unspecified, with unspecified nicotine-induced disorders: Secondary | ICD-10-CM

## 2024-02-11 ENCOUNTER — Encounter: Payer: Self-pay | Admitting: Hematology and Oncology

## 2024-02-17 LAB — HM COLONOSCOPY

## 2024-03-14 ENCOUNTER — Other Ambulatory Visit: Payer: Self-pay | Admitting: Cardiology

## 2024-03-14 DIAGNOSIS — I2510 Atherosclerotic heart disease of native coronary artery without angina pectoris: Secondary | ICD-10-CM

## 2024-03-14 DIAGNOSIS — E785 Hyperlipidemia, unspecified: Secondary | ICD-10-CM

## 2024-03-14 NOTE — Telephone Encounter (Signed)
 Repatha  refused due to need for appt

## 2024-03-16 ENCOUNTER — Other Ambulatory Visit: Payer: Self-pay

## 2024-03-16 DIAGNOSIS — G589 Mononeuropathy, unspecified: Secondary | ICD-10-CM | POA: Insufficient documentation

## 2024-03-16 DIAGNOSIS — C50919 Malignant neoplasm of unspecified site of unspecified female breast: Secondary | ICD-10-CM | POA: Insufficient documentation

## 2024-03-16 DIAGNOSIS — F319 Bipolar disorder, unspecified: Secondary | ICD-10-CM | POA: Insufficient documentation

## 2024-03-16 DIAGNOSIS — F339 Major depressive disorder, recurrent, unspecified: Secondary | ICD-10-CM | POA: Insufficient documentation

## 2024-03-16 DIAGNOSIS — Z9882 Breast implant status: Secondary | ICD-10-CM | POA: Insufficient documentation

## 2024-03-16 DIAGNOSIS — Z9221 Personal history of antineoplastic chemotherapy: Secondary | ICD-10-CM | POA: Insufficient documentation

## 2024-03-16 DIAGNOSIS — S42302A Unspecified fracture of shaft of humerus, left arm, initial encounter for closed fracture: Secondary | ICD-10-CM | POA: Insufficient documentation

## 2024-03-16 DIAGNOSIS — F32A Depression, unspecified: Secondary | ICD-10-CM | POA: Insufficient documentation

## 2024-03-16 DIAGNOSIS — C55 Malignant neoplasm of uterus, part unspecified: Secondary | ICD-10-CM | POA: Insufficient documentation

## 2024-03-16 DIAGNOSIS — E785 Hyperlipidemia, unspecified: Secondary | ICD-10-CM | POA: Insufficient documentation

## 2024-03-16 DIAGNOSIS — Z9289 Personal history of other medical treatment: Secondary | ICD-10-CM | POA: Insufficient documentation

## 2024-03-16 DIAGNOSIS — F419 Anxiety disorder, unspecified: Secondary | ICD-10-CM | POA: Insufficient documentation

## 2024-03-16 DIAGNOSIS — T8859XA Other complications of anesthesia, initial encounter: Secondary | ICD-10-CM | POA: Insufficient documentation

## 2024-03-16 DIAGNOSIS — M461 Sacroiliitis, not elsewhere classified: Secondary | ICD-10-CM | POA: Insufficient documentation

## 2024-03-16 DIAGNOSIS — I10 Essential (primary) hypertension: Secondary | ICD-10-CM | POA: Insufficient documentation

## 2024-03-16 DIAGNOSIS — J309 Allergic rhinitis, unspecified: Secondary | ICD-10-CM | POA: Insufficient documentation

## 2024-03-16 DIAGNOSIS — C719 Malignant neoplasm of brain, unspecified: Secondary | ICD-10-CM | POA: Insufficient documentation

## 2024-03-21 ENCOUNTER — Ambulatory Visit: Attending: Cardiology | Admitting: Cardiology

## 2024-03-22 ENCOUNTER — Other Ambulatory Visit: Payer: 59

## 2024-03-22 ENCOUNTER — Inpatient Hospital Stay

## 2024-03-22 ENCOUNTER — Ambulatory Visit: Payer: 59 | Admitting: Hematology and Oncology

## 2024-03-22 ENCOUNTER — Inpatient Hospital Stay: Admitting: Oncology

## 2024-03-24 ENCOUNTER — Other Ambulatory Visit: Payer: Self-pay

## 2024-03-24 DIAGNOSIS — E785 Hyperlipidemia, unspecified: Secondary | ICD-10-CM

## 2024-03-24 DIAGNOSIS — I2510 Atherosclerotic heart disease of native coronary artery without angina pectoris: Secondary | ICD-10-CM

## 2024-03-29 ENCOUNTER — Other Ambulatory Visit: Payer: Self-pay | Admitting: Cardiology

## 2024-03-29 DIAGNOSIS — I2510 Atherosclerotic heart disease of native coronary artery without angina pectoris: Secondary | ICD-10-CM

## 2024-03-29 DIAGNOSIS — E785 Hyperlipidemia, unspecified: Secondary | ICD-10-CM

## 2024-04-05 DIAGNOSIS — G40109 Localization-related (focal) (partial) symptomatic epilepsy and epileptic syndromes with simple partial seizures, not intractable, without status epilepticus: Secondary | ICD-10-CM | POA: Insufficient documentation

## 2024-04-05 DIAGNOSIS — G40909 Epilepsy, unspecified, not intractable, without status epilepticus: Secondary | ICD-10-CM | POA: Insufficient documentation

## 2024-04-05 DIAGNOSIS — R32 Unspecified urinary incontinence: Secondary | ICD-10-CM | POA: Insufficient documentation

## 2024-04-05 DIAGNOSIS — M810 Age-related osteoporosis without current pathological fracture: Secondary | ICD-10-CM | POA: Insufficient documentation

## 2024-04-05 DIAGNOSIS — E559 Vitamin D deficiency, unspecified: Secondary | ICD-10-CM | POA: Insufficient documentation

## 2024-04-05 DIAGNOSIS — M928 Other specified juvenile osteochondrosis: Secondary | ICD-10-CM | POA: Insufficient documentation

## 2024-04-05 DIAGNOSIS — N939 Abnormal uterine and vaginal bleeding, unspecified: Secondary | ICD-10-CM | POA: Insufficient documentation

## 2024-04-29 ENCOUNTER — Telehealth: Payer: Self-pay | Admitting: Cardiology

## 2024-04-29 DIAGNOSIS — I2510 Atherosclerotic heart disease of native coronary artery without angina pectoris: Secondary | ICD-10-CM

## 2024-04-29 DIAGNOSIS — E785 Hyperlipidemia, unspecified: Secondary | ICD-10-CM

## 2024-04-29 NOTE — Telephone Encounter (Signed)
*  STAT* If patient is at the pharmacy, call can be transferred to refill team.   1. Which medications need to be refilled? (please list name of each medication and dose if known) Evolocumab  (REPATHA  SURECLICK) 140 MG/ML SOAJ   2. Which pharmacy/location (including street and city if local pharmacy) is medication to be sent to? [id number] - [place], [place] - [address]   3. Do they need a 30 day or 90 day supply? 90

## 2024-05-02 NOTE — Telephone Encounter (Signed)
 Patient has not been seen since 08/21/22

## 2024-05-18 ENCOUNTER — Ambulatory Visit: Admitting: Cardiology

## 2024-06-21 NOTE — Progress Notes (Incomplete)
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 smoking cessation. (Patient not taking: Reported on 07/21/2023) 100 tablet 5   oxyCODONE  (OXY IR/ROXICODONE ) 5 MG immediate release tablet Take 5 mg by mouth every 8 (eight) hours as needed for moderate pain (pain score 4-6) or severe pain (pain score 7-10).     QUEtiapine (SEROQUEL) 100 MG tablet Take 100 mg by mouth at bedtime. Patient reports taking  this if she awakens during the night     QUEtiapine (SEROQUEL) 400 MG tablet Take 400 mg by mouth at bedtime.     rosuvastatin  (CRESTOR ) 40 MG tablet Take 1 tablet (40 mg total) by mouth daily. Patient needs to make an appointment for further refills. 1st attempt. 30 tablet 0   triamcinolone  cream (KENALOG ) 0.1 % Apply 1 Application topically daily.     varenicline  (CHANTIX ) 1 MG tablet Take 1 tablet (1 mg total) by mouth 2 (two) times daily. 60 tablet 2    Vitamin D, Ergocalciferol, (DRISDOL) 1.25 MG (50000 UNIT) CAPS capsule Take 50,000 Units by mouth every 7 (seven) days. (Patient not taking: Reported on 08/18/2022)     No current facility-administered medications for this visit.    I,Jasmine M Lassiter,acting as a scribe for Wanda VEAR Cornish, MD.,have documented all relevant documentation on the behalf of Wanda VEAR Cornish, MD,as directed by  Wanda VEAR Cornish, MD while in the  presence of Wanda VEAR Cornish, MD.

## 2024-06-22 ENCOUNTER — Telehealth: Payer: Self-pay

## 2024-06-22 ENCOUNTER — Inpatient Hospital Stay: Admitting: Oncology

## 2024-06-22 ENCOUNTER — Inpatient Hospital Stay

## 2024-06-22 NOTE — Telephone Encounter (Signed)
 Dr Cornelius: she has had 3 malignancies, Glioblastoma of the brain, Ductal carcinoma in situ of the breast, and endometrial cancer of the uterus. These do not tend to be inherited

## 2024-06-23 ENCOUNTER — Encounter: Payer: Self-pay | Admitting: Hematology and Oncology

## 2024-08-16 ENCOUNTER — Inpatient Hospital Stay

## 2024-08-16 ENCOUNTER — Inpatient Hospital Stay: Admitting: Oncology

## 2024-08-17 ENCOUNTER — Ambulatory Visit: Admitting: Cardiology

## 2024-08-31 ENCOUNTER — Encounter: Payer: Self-pay | Admitting: Oncology

## 2024-08-31 ENCOUNTER — Inpatient Hospital Stay (HOSPITAL_BASED_OUTPATIENT_CLINIC_OR_DEPARTMENT_OTHER): Admitting: Oncology

## 2024-08-31 ENCOUNTER — Inpatient Hospital Stay: Attending: Oncology

## 2024-08-31 ENCOUNTER — Other Ambulatory Visit: Payer: Self-pay | Admitting: Oncology

## 2024-08-31 VITALS — BP 116/67 | HR 73 | Temp 97.6°F | Resp 18 | Ht 63.0 in | Wt 128.5 lb

## 2024-08-31 DIAGNOSIS — Z1501 Genetic susceptibility to malignant neoplasm of breast: Secondary | ICD-10-CM

## 2024-08-31 DIAGNOSIS — Z86000 Personal history of in-situ neoplasm of breast: Secondary | ICD-10-CM | POA: Diagnosis not present

## 2024-08-31 DIAGNOSIS — D509 Iron deficiency anemia, unspecified: Secondary | ICD-10-CM | POA: Diagnosis present

## 2024-08-31 DIAGNOSIS — Z1589 Genetic susceptibility to other disease: Secondary | ICD-10-CM

## 2024-08-31 DIAGNOSIS — Z1507 Genetic susceptibility to malignant neoplasm of urinary tract: Secondary | ICD-10-CM

## 2024-08-31 DIAGNOSIS — D539 Nutritional anemia, unspecified: Secondary | ICD-10-CM

## 2024-08-31 DIAGNOSIS — C719 Malignant neoplasm of brain, unspecified: Secondary | ICD-10-CM

## 2024-08-31 DIAGNOSIS — Z15068 Genetic susceptibility to other malignant neoplasm of digestive system: Secondary | ICD-10-CM

## 2024-08-31 DIAGNOSIS — Z15060 Genetic susceptibility to colorectal cancer: Secondary | ICD-10-CM

## 2024-08-31 DIAGNOSIS — Z1509 Genetic susceptibility to other malignant neoplasm: Secondary | ICD-10-CM | POA: Diagnosis not present

## 2024-08-31 LAB — CBC WITH DIFFERENTIAL (CANCER CENTER ONLY)
Abs Immature Granulocytes: 0.02 K/uL (ref 0.00–0.07)
Basophils Absolute: 0.1 K/uL (ref 0.0–0.1)
Basophils Relative: 1 %
Eosinophils Absolute: 0.2 K/uL (ref 0.0–0.5)
Eosinophils Relative: 4 %
HCT: 37.4 % (ref 36.0–46.0)
Hemoglobin: 12.2 g/dL (ref 12.0–15.0)
Immature Granulocytes: 0 %
Lymphocytes Relative: 39 %
Lymphs Abs: 2.4 K/uL (ref 0.7–4.0)
MCH: 30.7 pg (ref 26.0–34.0)
MCHC: 32.6 g/dL (ref 30.0–36.0)
MCV: 94.2 fL (ref 80.0–100.0)
Monocytes Absolute: 0.4 K/uL (ref 0.1–1.0)
Monocytes Relative: 7 %
Neutro Abs: 3 K/uL (ref 1.7–7.7)
Neutrophils Relative %: 49 %
Platelet Count: 210 K/uL (ref 150–400)
RBC: 3.97 MIL/uL (ref 3.87–5.11)
RDW: 14.1 % (ref 11.5–15.5)
WBC Count: 6.1 K/uL (ref 4.0–10.5)
nRBC: 0 % (ref 0.0–0.2)

## 2024-08-31 LAB — IRON AND TIBC
Iron: 61 ug/dL (ref 28–170)
Saturation Ratios: 19 % (ref 10.4–31.8)
TIBC: 319 ug/dL (ref 250–450)
UIBC: 258 ug/dL

## 2024-08-31 LAB — CMP (CANCER CENTER ONLY)
ALT: 19 U/L (ref 0–44)
AST: 27 U/L (ref 15–41)
Albumin: 4.3 g/dL (ref 3.5–5.0)
Alkaline Phosphatase: 65 U/L (ref 38–126)
Anion gap: 9 (ref 5–15)
BUN: 8 mg/dL (ref 6–20)
CO2: 28 mmol/L (ref 22–32)
Calcium: 9.2 mg/dL (ref 8.9–10.3)
Chloride: 107 mmol/L (ref 98–111)
Creatinine: 1.01 mg/dL — ABNORMAL HIGH (ref 0.44–1.00)
GFR, Estimated: >60 mL/min (ref >60)
Glucose, Bld: 97 mg/dL (ref 70–99)
Potassium: 4 mmol/L (ref 3.5–5.1)
Sodium: 144 mmol/L (ref 135–145)
Total Bilirubin: 0.2 mg/dL (ref 0.0–1.2)
Total Protein: 6.5 g/dL (ref 6.5–8.1)

## 2024-08-31 LAB — FERRITIN: Ferritin: 106 ng/mL (ref 11–307)

## 2024-08-31 NOTE — Progress Notes (Addendum)
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  Medication Sig Dispense Refill   clonazePAM (KLONOPIN) 0.5 MG tablet Take 0.5 mg by mouth 2 (two) times daily as needed.     albuterol (VENTOLIN HFA) 108 (90 Base) MCG/ACT inhaler Inhale 1 puff into the lungs every 6 (six) hours as needed for wheezing or shortness of breath.     alendronate (FOSAMAX) 70 MG tablet Take 70 mg by mouth once a week.     busPIRone (BUSPAR) 30 MG tablet Take 30 mg by mouth daily.     celecoxib (CELEBREX) 100 MG capsule Take 100 mg by mouth daily.     cyanocobalamin  (VITAMIN B12) 1000 MCG/ML injection Inject 1,000 mcg into the muscle every 30 (thirty) days. (Patient not taking: Reported on 09/22/2023)     donepezil (ARICEPT) 10 MG tablet Take 10 mg by mouth at bedtime.     escitalopram (LEXAPRO) 20 MG tablet Take 20 mg by mouth at bedtime.     Evolocumab  (REPATHA  SURECLICK) 140 MG/ML SOAJ  Inject 140 mg into the skin every 14 (fourteen) days. 2nd attempt, patient needs an appt for additional refills 2 mL 0   ezetimibe  (ZETIA ) 10 MG tablet Take 1 tablet (10 mg total) by mouth daily. 90 tablet 2   FLUoxetine (PROZAC) 20 MG capsule Take 20 mg by mouth 3 (three) times daily.  2   gabapentin (NEURONTIN) 600 MG tablet Take 600 mg by mouth every 6 (six) hours. Patient reports taking 3 times daily     GOODSENSE CLEARLAX 17 GM/SCOOP powder Take 17 g by mouth daily.     levocetirizine (XYZAL) 5 MG tablet Take 5 mg by mouth 2 (two) times daily.     meloxicam (MOBIC) 15 MG tablet Take 15 mg by mouth daily.     montelukast (SINGULAIR) 10 MG tablet Take 10 mg by mouth daily.     naloxone (NARCAN) nasal spray 4 mg/0.1 mL Place 1 spray into the nose once.     nicotine  (NICODERM CQ  - DOSED IN MG/24 HOURS) 21 mg/24hr patch Place 1 patch (  21 mg total) onto the skin daily. (Patient not taking: Reported on 07/21/2023) 28 patch 5   nicotine  polacrilex (NICORETTE  MINI) 4 MG lozenge Take 1 lozenge (4 mg total) by mouth as needed for smoking cessation. (Patient not taking: Reported on 07/21/2023) 100 tablet 5   oxyCODONE  (OXY IR/ROXICODONE ) 5 MG immediate release tablet Take 5 mg by mouth every 8 (eight) hours as needed for moderate pain (pain score 4-6) or severe pain (pain score 7-10).     QUEtiapine (SEROQUEL) 100 MG tablet Take 100 mg by mouth at bedtime. Patient reports taking  this if she awakens during the night     QUEtiapine (SEROQUEL) 400 MG tablet Take 400 mg by mouth at bedtime.     rosuvastatin  (CRESTOR ) 40 MG tablet Take 1 tablet (40 mg total) by mouth daily. Patient needs to make an appointment for further refills. 1st attempt. 30 tablet 0   triamcinolone  cream (KENALOG ) 0.1 % Apply 1 Application topically daily.     Vitamin D, Ergocalciferol, (DRISDOL) 1.25 MG (50000 UNIT) CAPS capsule Take 50,000 Units by mouth every 7 (seven) days. (Patient not taking: Reported on 08/18/2022)     No current  facility-administered medications for this visit.    I,Jasmine M Lassiter,acting as a scribe for Wanda VEAR Cornish, MD.,have documented all relevant documentation on the behalf of Wanda VEAR Cornish, MD,as directed by  Wanda VEAR Cornish, MD while in the presence of Wanda VEAR Cornish, MD.

## 2024-09-01 ENCOUNTER — Other Ambulatory Visit: Payer: Self-pay | Admitting: Oncology

## 2024-09-01 DIAGNOSIS — Z9882 Breast implant status: Secondary | ICD-10-CM

## 2024-09-12 ENCOUNTER — Encounter: Payer: Self-pay | Admitting: Hematology and Oncology

## 2024-09-13 ENCOUNTER — Ambulatory Visit: Attending: Cardiology | Admitting: Cardiology

## 2024-09-13 ENCOUNTER — Encounter: Payer: Self-pay | Admitting: Cardiology

## 2024-09-13 VITALS — BP 120/70 | HR 69 | Ht 63.0 in | Wt 126.0 lb

## 2024-09-13 DIAGNOSIS — E785 Hyperlipidemia, unspecified: Secondary | ICD-10-CM | POA: Diagnosis not present

## 2024-09-13 DIAGNOSIS — I2510 Atherosclerotic heart disease of native coronary artery without angina pectoris: Secondary | ICD-10-CM

## 2024-09-13 DIAGNOSIS — C541 Malignant neoplasm of endometrium: Secondary | ICD-10-CM | POA: Diagnosis not present

## 2024-09-13 DIAGNOSIS — Z720 Tobacco use: Secondary | ICD-10-CM

## 2024-09-13 DIAGNOSIS — I10 Essential (primary) hypertension: Secondary | ICD-10-CM | POA: Diagnosis not present

## 2024-09-13 NOTE — Patient Instructions (Signed)
 Medication Instructions:  Your physician recommends that you continue on your current medications as directed. Please refer to the Current Medication list given to you today.  *If you need a refill on your cardiac medications before your next appointment, please call your pharmacy*  Lab Work: Your physician recommends that you return for lab work in: Next few days for a Fasting Lipid Panel Lab opens at 8am. You DO NOT NEED an appointment. Best time to come is between 8am and 11:45 and between 1:30 and 4:30. If you have been asked to fast for your blood work please have nothing to eat or drink after midnight. You may have water.   If you have labs (blood work) drawn today and your tests are completely normal, you will receive your results only by: MyChart Message (if you have MyChart) OR A paper copy in the mail If you have any lab test that is abnormal or we need to change your treatment, we will call you to review the results.  Testing/Procedures: Your physician has requested that you have an echocardiogram. Echocardiography is a painless test that uses sound waves to create images of your heart. It provides your doctor with information about the size and shape of your heart and how well your heart's chambers and valves are working. This procedure takes approximately one hour. There are no restrictions for this procedure. Please do NOT wear cologne, perfume, aftershave, or lotions (deodorant is allowed). Please arrive 15 minutes prior to your appointment time.  Please note: We ask at that you not bring children with you during ultrasound (echo/ vascular) testing. Due to room size and safety concerns, children are not allowed in the ultrasound rooms during exams. Our front office staff cannot provide observation of children in our lobby area while testing is being conducted. An adult accompanying a patient to their appointment will only be allowed in the ultrasound room at the discretion of the  ultrasound technician under special circumstances. We apologize for any inconvenience.   Follow-Up: At [facility], you and your health needs are our priority.  As part of our continuing mission to provide you with exceptional heart care, our providers are all part of one team.  This team includes your primary Cardiologist (physician) and Advanced Practice Providers or APPs (Physician Assistants and Nurse Practitioners) who all work together to provide you with the care you need, when you need it.  Your next appointment:   1 year(s)  Provider:   Lamar Fitch, MD    We recommend signing up for the patient portal called MyChart.  Sign up information is provided on this After Visit Summary.  MyChart is used to connect with patients for Virtual Visits (Telemedicine).  Patients are able to view lab/test results, encounter notes, upcoming appointments, etc.  Non-urgent messages can be sent to your provider as well.   To learn more about what you can do with MyChart, go to [email or web address].   Other Instructions
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
## 2024-09-15 ENCOUNTER — Other Ambulatory Visit (HOSPITAL_BASED_OUTPATIENT_CLINIC_OR_DEPARTMENT_OTHER): Payer: Self-pay | Admitting: Family

## 2024-09-15 DIAGNOSIS — Z1231 Encounter for screening mammogram for malignant neoplasm of breast: Secondary | ICD-10-CM

## 2024-09-15 DIAGNOSIS — Z13820 Encounter for screening for osteoporosis: Secondary | ICD-10-CM

## 2024-09-23 NOTE — Progress Notes (Signed)
 Reviewed labs from 08/31/2024, her iron  levels were normal, no iron  needed.

## 2024-10-04 ENCOUNTER — Inpatient Hospital Stay: Admission: RE | Admit: 2024-10-04 | Source: Ambulatory Visit

## 2024-10-05 ENCOUNTER — Telehealth: Payer: Self-pay | Admitting: Cardiology

## 2024-10-05 DIAGNOSIS — E785 Hyperlipidemia, unspecified: Secondary | ICD-10-CM

## 2024-10-05 DIAGNOSIS — I2510 Atherosclerotic heart disease of native coronary artery without angina pectoris: Secondary | ICD-10-CM

## 2024-10-05 NOTE — Telephone Encounter (Signed)
*  STAT* If patient is at the pharmacy, call can be transferred to refill team.   1. Which medications need to be refilled? (please list name of each medication and dose if known)   Evolocumab  (REPATHA  SURECLICK) 140 MG/ML SOAJ     2. Would you like to learn more about the convenience, safety, & potential cost savings by using the [facility]? No    3. Are you open to using the [facility] (Type [facility]. No    4. Which pharmacy/location (including street and city if local pharmacy) is medication to be sent to? [facility] - [place],  - [address]        5. Do they need a 30 day or 90 day supply?

## 2024-10-05 NOTE — Telephone Encounter (Signed)
 Refill Request.

## 2024-10-06 MED ORDER — REPATHA SURECLICK 140 MG/ML ~~LOC~~ SOAJ: 1.0000 mL | SUBCUTANEOUS | 0 refills | Status: DC

## 2024-10-06 MED ORDER — REPATHA SURECLICK 140 MG/ML ~~LOC~~ SOAJ: 1.0000 mL | SUBCUTANEOUS | 11 refills | Status: AC

## 2024-10-11 ENCOUNTER — Encounter (HOSPITAL_BASED_OUTPATIENT_CLINIC_OR_DEPARTMENT_OTHER): Payer: Self-pay

## 2024-10-12 ENCOUNTER — Ambulatory Visit: Attending: Cardiology

## 2024-10-12 DIAGNOSIS — I10 Essential (primary) hypertension: Secondary | ICD-10-CM | POA: Diagnosis not present

## 2024-10-12 DIAGNOSIS — I2510 Atherosclerotic heart disease of native coronary artery without angina pectoris: Secondary | ICD-10-CM | POA: Diagnosis not present

## 2024-10-12 LAB — ECHOCARDIOGRAM COMPLETE
AR max vel: 2.15 cm2
AV Area VTI: 2.38 cm2
AV Area mean vel: 2.24 cm2
AV Mean grad: 2.3 mmHg
AV Peak grad: 4.4 mmHg
Ao pk vel: 1.05 m/s
Area-P 1/2: 3.2 cm2
MV VTI: 0.93 cm2
S' Lateral: 2.8 cm

## 2024-10-14 ENCOUNTER — Ambulatory Visit: Payer: Self-pay | Admitting: Cardiology

## 2024-11-12 ENCOUNTER — Other Ambulatory Visit (HOSPITAL_BASED_OUTPATIENT_CLINIC_OR_DEPARTMENT_OTHER): Payer: Self-pay | Admitting: Family

## 2024-11-12 DIAGNOSIS — Z1231 Encounter for screening mammogram for malignant neoplasm of breast: Secondary | ICD-10-CM

## 2024-11-15 ENCOUNTER — Ambulatory Visit
Admission: RE | Admit: 2024-11-15 | Discharge: 2024-11-15 | Disposition: A | Source: Ambulatory Visit | Attending: Oncology | Admitting: Oncology

## 2024-11-15 ENCOUNTER — Encounter: Payer: Self-pay | Admitting: Hematology and Oncology

## 2024-11-15 DIAGNOSIS — Z9882 Breast implant status: Secondary | ICD-10-CM

## 2024-12-23 ENCOUNTER — Other Ambulatory Visit (HOSPITAL_BASED_OUTPATIENT_CLINIC_OR_DEPARTMENT_OTHER): Admitting: Radiology

## 2025-08-31 ENCOUNTER — Inpatient Hospital Stay: Admitting: Hematology and Oncology

## 2025-08-31 ENCOUNTER — Inpatient Hospital Stay
